# Patient Record
Sex: Female | Born: 1960 | Race: Black or African American | Hispanic: No | Marital: Single | Smoking: Current every day smoker
Health system: Southern US, Community
[De-identification: ages and names within clinical notes are randomized; demographics above are authoritative.]

## PROBLEM LIST (undated history)

## (undated) DIAGNOSIS — F319 Bipolar disorder, unspecified: Secondary | ICD-10-CM

## (undated) DIAGNOSIS — J189 Pneumonia, unspecified organism: Secondary | ICD-10-CM

## (undated) DIAGNOSIS — F419 Anxiety disorder, unspecified: Secondary | ICD-10-CM

## (undated) DIAGNOSIS — I639 Cerebral infarction, unspecified: Secondary | ICD-10-CM

## (undated) DIAGNOSIS — E119 Type 2 diabetes mellitus without complications: Secondary | ICD-10-CM

## (undated) DIAGNOSIS — F329 Major depressive disorder, single episode, unspecified: Secondary | ICD-10-CM

## (undated) DIAGNOSIS — I10 Essential (primary) hypertension: Secondary | ICD-10-CM

## (undated) DIAGNOSIS — F32A Depression, unspecified: Secondary | ICD-10-CM

---

## 2001-12-23 ENCOUNTER — Encounter: Admission: RE | Admit: 2001-12-23 | Discharge: 2002-03-23 | Payer: Self-pay | Admitting: Family Medicine

## 2002-12-04 ENCOUNTER — Emergency Department (HOSPITAL_COMMUNITY): Admission: EM | Admit: 2002-12-04 | Discharge: 2002-12-04 | Payer: Self-pay | Admitting: Emergency Medicine

## 2004-02-08 ENCOUNTER — Emergency Department (HOSPITAL_COMMUNITY): Admission: EM | Admit: 2004-02-08 | Discharge: 2004-02-08 | Payer: Self-pay | Admitting: Emergency Medicine

## 2006-09-16 ENCOUNTER — Emergency Department (HOSPITAL_COMMUNITY): Admission: EM | Admit: 2006-09-16 | Discharge: 2006-09-16 | Payer: Self-pay | Admitting: Emergency Medicine

## 2007-01-14 ENCOUNTER — Emergency Department (HOSPITAL_COMMUNITY): Admission: EM | Admit: 2007-01-14 | Discharge: 2007-01-14 | Payer: Self-pay | Admitting: Emergency Medicine

## 2008-05-27 ENCOUNTER — Emergency Department (HOSPITAL_COMMUNITY): Admission: EM | Admit: 2008-05-27 | Discharge: 2008-05-27 | Payer: Self-pay | Admitting: Emergency Medicine

## 2011-05-23 ENCOUNTER — Inpatient Hospital Stay (INDEPENDENT_AMBULATORY_CARE_PROVIDER_SITE_OTHER)
Admission: RE | Admit: 2011-05-23 | Discharge: 2011-05-23 | Disposition: A | Discharge status: Home / Self Care | Payer: Self-pay | Source: Ambulatory Visit | Attending: Family Medicine | Admitting: Family Medicine

## 2011-05-23 DIAGNOSIS — L259 Unspecified contact dermatitis, unspecified cause: Secondary | ICD-10-CM

## 2012-05-13 ENCOUNTER — Emergency Department (HOSPITAL_COMMUNITY)
Admission: EM | Admit: 2012-05-13 | Discharge: 2012-05-13 | Disposition: A | Discharge status: Home / Self Care | Payer: Medicare Other | Attending: Emergency Medicine | Admitting: Emergency Medicine

## 2012-05-13 ENCOUNTER — Emergency Department (HOSPITAL_COMMUNITY): Payer: Medicare Other

## 2012-05-13 ENCOUNTER — Encounter (HOSPITAL_COMMUNITY): Payer: Self-pay | Admitting: *Deleted

## 2012-05-13 DIAGNOSIS — S43429A Sprain of unspecified rotator cuff capsule, initial encounter: Secondary | ICD-10-CM

## 2012-05-13 DIAGNOSIS — F17200 Nicotine dependence, unspecified, uncomplicated: Secondary | ICD-10-CM | POA: Insufficient documentation

## 2012-05-13 DIAGNOSIS — M25519 Pain in unspecified shoulder: Secondary | ICD-10-CM

## 2012-05-13 MED ORDER — IBUPROFEN 200 MG PO TABS
600.0000 mg | ORAL_TABLET | Freq: Once | ORAL | Status: AC
  Administered 2012-05-13: 600 mg via ORAL
  Filled 2012-05-13: qty 1

## 2012-05-13 MED ORDER — HYDROCODONE-ACETAMINOPHEN 5-325 MG PO TABS
2.0000 | ORAL_TABLET | Freq: Once | ORAL | Status: AC
  Administered 2012-05-13: 2 via ORAL
  Filled 2012-05-13: qty 2

## 2012-05-13 MED ORDER — IBUPROFEN 600 MG PO TABS: 600.0000 mg | ORAL_TABLET | Freq: Four times a day (QID) | ORAL | Status: DC | PRN

## 2012-05-13 MED ORDER — HYDROCODONE-ACETAMINOPHEN 5-325 MG PO TABS: 1.0000 | ORAL_TABLET | Freq: Four times a day (QID) | ORAL | Status: DC | PRN

## 2012-05-13 NOTE — ED Provider Notes (Addendum)
History  This chart was scribed for Derwood Kaplan, MD by Ardeen Jourdain. This patient was seen in room TR08C/TR08C and the patient's care was started at 1800.  CSN: [id number]  Arrival date & time 05/13/12  1747   None     Chief Complaint  Patient presents with  . Arm Pain     The history is provided by the patient. No language interpreter was used.    Gina Richards is a 51 y.o. female who presents to the Emergency Department complaining of 3 to 4 months of constant, gradual onset, acutely worsening right shoulder pain. She states she cannot raise her arm above her shoulder due to the pain. She denies any recent injuries or falls. She denies taking any medications for the shoulder pain prior to coming to the ED. She denies seeing a MD prior to today. She denies fever, chills, nausea and emesis as associated symptoms. She does not have a h/o chronic medical conditions. She has a h/o of nerve related pain in her hands for which she takes Gabapentin. She is a current everyday smoker and admits to drinking alcohol.  Pt is right-handed.  No PCP.   History reviewed. No pertinent past medical history.  History reviewed. No pertinent past surgical history.  No family history on file.  History  Substance Use Topics  . Smoking status: Current Every Day Smoker  . Smokeless tobacco: Not on file  . Alcohol Use: Yes     Review of Systems  A complete 10 system review of systems was obtained and all systems are negative except as noted in the HPI and PMH.    Allergies  Review of patient's allergies indicates no known allergies.  Home Medications   Current Outpatient Rx  Name Route Sig Dispense Refill  . FLUOXETINE HCL 40 MG PO CAPS Oral Take 40 mg by mouth daily.    Marland Kitchen GABAPENTIN 600 MG PO TABS Oral Take 600 mg by mouth 3 (three) times daily.    Marland Kitchen ZOLPIDEM TARTRATE 5 MG PO TABS Oral Take 5 mg by mouth at bedtime as needed. For insomnia      Triage Vitals: BP 162/97  Pulse 65   Temp 98.5 F (36.9 C) (Oral)  Resp 18  SpO2 97%  Physical Exam  Nursing note and vitals reviewed. Constitutional: She is oriented to person, place, and time. She appears well-developed and well-nourished. No distress.  HENT:  Head: Normocephalic and atraumatic.  Eyes: EOM are normal.  Neck: Neck supple. No tracheal deviation present.  Cardiovascular: Normal rate and regular rhythm.        2+ radial pulse  Pulmonary/Chest: Effort normal and breath sounds normal. No respiratory distress.  Abdominal: Soft. Bowel sounds are normal.  Musculoskeletal: Normal range of motion. She exhibits tenderness.       Clavicle intact, no step off, GH joint tenderness over right shoulder, forward flexion up the level of the shoulder plane, abduction to the level of the shoulder plane, mild tenderness with external and internal rotation.   Neurological: She is alert and oriented to person, place, and time.  Skin: Skin is warm and dry.  Psychiatric: She has a normal mood and affect. Her behavior is normal.    ED Course  Procedures (including critical care time)  DIAGNOSTIC STUDIES:  Oxygen Saturation is 97% on room air, adequate by my interpretation.    COORDINATION OF CARE: 60- Discussed treatment plan with pt at bedside and pt agreed to plan.    Labs  Reviewed - No data to display Dg Shoulder Right  05/13/2012  *RADIOLOGY REPORT*  Clinical Data: Right shoulder pain  RIGHT SHOULDER - 2+ VIEW  Comparison: Chest radiograph 09/16/2006  Findings: Glenohumeral joint is intact.  No evidence of scapular fractures or fracture.  There is osteophytosis of the humeral head as well as the acromioclavicular joint.  IMPRESSION:  1.  No evidence of fracture or dislocation. 2.  Chronic degenerative joint disease.   Original Report Authenticated By: Genevive Bi, M.D.      No diagnosis found.    MDM  Medical screening examination/treatment/procedure(s) were performed by me as the supervising physician.  Scribe service was utilized for documentation only.  DDX: Rotator cuff injury DJD of the shoulder  Pt comes in with cc of shoulder pain. Her exam reveals decreased ROm of the right shoulder, primarily due to pain. The exam is also consistent with likely rotator cuff injury. Will get a shoulder X-ray. Will give pain meds, and will request pcp and ortho f/u - since she might need further imaging or Physical therapy.    Derwood Kaplan, MD 05/13/12 2137  Derwood Kaplan, MD 05/13/12 2140

## 2012-05-13 NOTE — ED Notes (Signed)
The pt is c/o pain  In her entire rt arm from her shoulder down to her rt fingers for 3-4 months.  No known injury

## 2012-06-14 ENCOUNTER — Emergency Department (HOSPITAL_COMMUNITY)
Admission: EM | Admit: 2012-06-14 | Discharge: 2012-06-14 | Disposition: A | Discharge status: Home / Self Care | Payer: Medicare Other | Attending: Emergency Medicine | Admitting: Emergency Medicine

## 2012-06-14 ENCOUNTER — Encounter (HOSPITAL_COMMUNITY): Payer: Self-pay | Admitting: Emergency Medicine

## 2012-06-14 DIAGNOSIS — F3289 Other specified depressive episodes: Secondary | ICD-10-CM | POA: Insufficient documentation

## 2012-06-14 DIAGNOSIS — I10 Essential (primary) hypertension: Secondary | ICD-10-CM | POA: Insufficient documentation

## 2012-06-14 DIAGNOSIS — F411 Generalized anxiety disorder: Secondary | ICD-10-CM | POA: Insufficient documentation

## 2012-06-14 DIAGNOSIS — F329 Major depressive disorder, single episode, unspecified: Secondary | ICD-10-CM | POA: Insufficient documentation

## 2012-06-14 DIAGNOSIS — K089 Disorder of teeth and supporting structures, unspecified: Secondary | ICD-10-CM | POA: Insufficient documentation

## 2012-06-14 DIAGNOSIS — K0889 Other specified disorders of teeth and supporting structures: Secondary | ICD-10-CM

## 2012-06-14 DIAGNOSIS — Z79899 Other long term (current) drug therapy: Secondary | ICD-10-CM | POA: Insufficient documentation

## 2012-06-14 DIAGNOSIS — F17200 Nicotine dependence, unspecified, uncomplicated: Secondary | ICD-10-CM | POA: Insufficient documentation

## 2012-06-14 HISTORY — DX: Depression, unspecified: F32.A

## 2012-06-14 HISTORY — DX: Anxiety disorder, unspecified: F41.9

## 2012-06-14 HISTORY — DX: Essential (primary) hypertension: I10

## 2012-06-14 HISTORY — DX: Major depressive disorder, single episode, unspecified: F32.9

## 2012-06-14 MED ORDER — PENICILLIN V POTASSIUM 500 MG PO TABS: 500.0000 mg | ORAL_TABLET | Freq: Four times a day (QID) | ORAL | Status: AC

## 2012-06-14 MED ORDER — OXYCODONE-ACETAMINOPHEN 5-325 MG PO TABS
2.0000 | ORAL_TABLET | Freq: Once | ORAL | Status: AC
  Administered 2012-06-14: 2 via ORAL
  Filled 2012-06-14: qty 2

## 2012-06-14 MED ORDER — OXYCODONE-ACETAMINOPHEN 5-325 MG PO TABS: 1.0000 | ORAL_TABLET | Freq: Four times a day (QID) | ORAL | Status: DC | PRN

## 2012-06-14 NOTE — ED Provider Notes (Signed)
History  Scribed for [employer]. Bernette Mayers, MD, the patient was seen in room TR06C/TR06C. This chart was scribed by Candelaria Stagers. The patient's care started at 6:11 PM   CSN: [id number]  Arrival date & time 06/14/12  1721   First MD Initiated Contact with Patient 06/14/12 1801      Chief Complaint  Patient presents with  . Facial Pain     The history is provided by the patient. No language interpreter was used.   Gina Richards is a 51 y.o. female who presents to the Emergency Department complaining of right sided facial pain that started yesterday with tingling.  She is also experiencing a headache.  Nothing seems to make the sx better or worse.   Past Medical History  Diagnosis Date  . Hypertension   . Anxiety   . Depression     History reviewed. No pertinent past surgical history.  History reviewed. No pertinent family history.  History  Substance Use Topics  . Smoking status: Current Every Day Smoker -- 0.2 packs/day    Types: Cigarettes  . Smokeless tobacco: Not on file  . Alcohol Use: Yes     occasion    OB History    Grav Para Term Preterm Abortions TAB SAB Ect Mult Living                  Review of Systems  HENT: Positive for facial swelling (right sided).        Right sided facial pain  All other systems reviewed and are negative.    Allergies  Review of patient's allergies indicates no known allergies.  Home Medications   Current Outpatient Rx  Name Route Sig Dispense Refill  . FLUOXETINE HCL 40 MG PO CAPS Oral Take 40 mg by mouth daily.    Marland Kitchen GABAPENTIN 600 MG PO TABS Oral Take 600 mg by mouth 3 (three) times daily.    Marland Kitchen HYDROCODONE-ACETAMINOPHEN 5-325 MG PO TABS Oral Take 1 tablet by mouth every 6 (six) hours as needed for pain. 10 tablet 0  . IBUPROFEN 600 MG PO TABS Oral Take 1 tablet (600 mg total) by mouth every 6 (six) hours as needed for pain. 30 tablet 0  . ZOLPIDEM TARTRATE 5 MG PO TABS Oral Take 5 mg by mouth at bedtime as needed. For  insomnia      BP 165/104  Pulse 72  Temp 98.1 F (36.7 C) (Oral)  Resp 18  SpO2 94%  Physical Exam  Nursing note and vitals reviewed. Constitutional: She is oriented to person, place, and time. She appears well-developed and well-nourished.  HENT:  Head: Normocephalic and atraumatic.       Tender over the gum of the right upper teeth.  No fluctuants.   Eyes: EOM are normal. Pupils are equal, round, and reactive to light.  Neck: Normal range of motion. Neck supple.  Cardiovascular: Normal rate, normal heart sounds and intact distal pulses.   Pulmonary/Chest: Effort normal and breath sounds normal.  Abdominal: Bowel sounds are normal. She exhibits no distension. There is no tenderness.  Musculoskeletal: Normal range of motion. She exhibits no edema and no tenderness.  Neurological: She is alert and oriented to person, place, and time. She has normal strength. No cranial nerve deficit or sensory deficit.  Skin: Skin is warm and dry. No rash noted.  Psychiatric: She has a normal mood and affect.    ED Course  Procedures   DIAGNOSTIC STUDIES:     COORDINATION OF  CARE:    Labs Reviewed - No data to display No results found.   No diagnosis found.    MDM  Facial pain, likely dental in origin, Given PCN/pain meds and Dental referral.    I personally performed the services described in the documentation, which were scribed in my presence. The recorded information has been reviewed and considered.        Tuvia Woodrick B. Bernette Mayers, MD 06/14/12 2130

## 2012-06-14 NOTE — ED Notes (Signed)
Pt reports having swelling and pain to R upper mouth/sinus; reports causing headache

## 2013-11-10 ENCOUNTER — Encounter (HOSPITAL_COMMUNITY): Payer: Self-pay | Admitting: Emergency Medicine

## 2013-11-10 ENCOUNTER — Emergency Department (HOSPITAL_COMMUNITY)
Admission: EM | Admit: 2013-11-10 | Discharge: 2013-11-10 | Disposition: A | Discharge status: Home / Self Care | Payer: Medicare Other | Attending: Emergency Medicine | Admitting: Emergency Medicine

## 2013-11-10 DIAGNOSIS — I10 Essential (primary) hypertension: Secondary | ICD-10-CM | POA: Insufficient documentation

## 2013-11-10 DIAGNOSIS — M79605 Pain in left leg: Secondary | ICD-10-CM

## 2013-11-10 DIAGNOSIS — F3289 Other specified depressive episodes: Secondary | ICD-10-CM | POA: Insufficient documentation

## 2013-11-10 DIAGNOSIS — F329 Major depressive disorder, single episode, unspecified: Secondary | ICD-10-CM | POA: Insufficient documentation

## 2013-11-10 DIAGNOSIS — F411 Generalized anxiety disorder: Secondary | ICD-10-CM | POA: Insufficient documentation

## 2013-11-10 DIAGNOSIS — M79609 Pain in unspecified limb: Secondary | ICD-10-CM | POA: Insufficient documentation

## 2013-11-10 DIAGNOSIS — F17200 Nicotine dependence, unspecified, uncomplicated: Secondary | ICD-10-CM | POA: Insufficient documentation

## 2013-11-10 DIAGNOSIS — Z79899 Other long term (current) drug therapy: Secondary | ICD-10-CM | POA: Insufficient documentation

## 2013-11-10 MED ORDER — HYDROCODONE-ACETAMINOPHEN 5-325 MG PO TABS: 1.0000 | ORAL_TABLET | Freq: Four times a day (QID) | ORAL | Status: DC | PRN

## 2013-11-10 MED ORDER — NAPROXEN 500 MG PO TABS: 500.0000 mg | ORAL_TABLET | Freq: Two times a day (BID) | ORAL | Status: DC

## 2013-11-10 NOTE — ED Notes (Signed)
Left l;eg pain  X 2 weeks hurts from going to foot denies injury

## 2013-11-10 NOTE — ED Provider Notes (Signed)
CSN: [id number]     Arrival date & time 11/10/13  1427 History  This chart was scribed for non-physician practitioner Felicie Mornavid Shayra Anton, NP, working with Glynn OctaveStephen Rancour, MD, by Valera CastleSteven Perry ED Scribe. This patient was seen in room TR05C/TR05C and the patient's care was started at 4:21 PM  Patient is a 53 y.o. female presenting with leg pain. The history is provided by the patient. No language interpreter was used.  Leg Pain Location:  Leg Time since incident:  2 weeks Injury: no   Leg location:  L leg Pain details:    Quality:  Throbbing   Onset quality:  Gradual   Duration:  2 weeks   Pain timing: waxing and waning.   Progression:  Worsening Chronicity:  New Prior injury to area:  No Worsened by:  Bearing weight Ineffective treatments:  Acetaminophen and NSAIDs Associated symptoms: numbness (left foot)   Associated symptoms: no fever and no swelling    HPI Comments: Gina Richards is a 53 y.o. female who presents to the Emergency Department with a chief complaint of gradually worsening, waxing and waning, throbbing, left LE pain, from her upper thigh down to her foot, with associated numbness in her foot, onset 2 weeks ago. She denies recent injury to her leg. She reports having a hard time ambulating, standing due to the pain. She has taken Tylenol, Ibuprofen for her pain, without relief. She reports taking psychiatric medication, onset 3-4 months ago. She denies fever, chills, urinary symptoms, and any other associated symptoms.  Past Medical History  Diagnosis Date  . Hypertension   . Anxiety   . Depression    History reviewed. No pertinent past surgical history. No family history on file. History  Substance Use Topics  . Smoking status: Current Every Day Smoker -- 0.25 packs/day    Types: Cigarettes  . Smokeless tobacco: Not on file  . Alcohol Use: Yes     Comment: occasion   OB History   Grav Para Term Preterm Abortions TAB SAB Ect Mult Living                 Review of  Systems  Constitutional: Negative for fever and chills.  Genitourinary: Negative for dysuria and difficulty urinating.  Musculoskeletal: Positive for myalgias (left LE, from thigh to foot). Negative for joint swelling.  All other systems reviewed and are negative.   Allergies  Review of patient's allergies indicates no known allergies.  Home Medications   Current Outpatient Rx  Name  Route  Sig  Dispense  Refill  . FLUoxetine (PROZAC) 40 MG capsule   Oral   Take 40 mg by mouth daily.         Marland Kitchen. gabapentin (NEURONTIN) 600 MG tablet   Oral   Take 600 mg by mouth 3 (three) times daily.          Triage Vitals: BP 163/98  Pulse 62  Temp(Src) 98.1 F (36.7 C)  Resp 16  SpO2 99%  Physical Exam  Nursing note and vitals reviewed. Constitutional: She is oriented to person, place, and time. She appears well-developed and well-nourished. No distress.  HENT:  Head: Normocephalic and atraumatic.  Eyes: EOM are normal.  Neck: Neck supple. No tracheal deviation present.  Cardiovascular: Normal rate and intact distal pulses.   Pulmonary/Chest: Effort normal. No respiratory distress.  Musculoskeletal: Normal range of motion.  No increased pain with active ROM. DP intact.  Neurological: She is alert and oriented to person, place, and time.  Good  strength to LE. Normal sensation.   Skin: Skin is warm and dry.  Psychiatric: She has a normal mood and affect. Her behavior is normal.   ED Course  Procedures (including critical care time)  DIAGNOSTIC STUDIES: Oxygen Saturation is 99% on room air, normal by my interpretation.    COORDINATION OF CARE: 4:27 PM-Discussed treatment plan with pt which includes muscle relaxant and pain medication, and pt agreed to plan.   Labs Review Labs Reviewed - No data to display Imaging Review No results found.   EKG Interpretation None      Medications - No data to display MDM   Final diagnoses:  None    Lower extremity pain.  No  indication of DVT.  I personally performed the services described in this documentation, which was scribed in my presence. The recorded information has been reviewed and is accurate.    Jimmye Norman, NP 11/11/13 [phone]

## 2013-11-10 NOTE — Discharge Instructions (Signed)
Pain of Unknown Etiology (Pain Without a Known Cause) You have come to your caregiver because of pain. Pain can occur in any part of the body. Often there is not a definite cause. Your caregiver may treat you without knowing the cause of the pain. Your caregiver determines a treatment based on your answers.  Often testing is not done unless there is no response to medications. Regardless of where your pain is located today, you can be given medications to make you comfortable. If no physical cause of pain can be found, most cases of pain will gradually leave as suddenly as they came.  If you have a painful condition and no reason can be found for the pain, it is important that you follow up with your caregiver. If the pain becomes worse or does not go away, it may be necessary to repeat tests and look further for a possible cause.  Only take over-the-counter or prescription medicines for pain, discomfort, or fever as directed by your caregiver.  You may continue all activities unless the activities cause more pain. When the pain lessens, it is important to gradually resume normal activities. Resume activities by beginning slowly and gradually increasing the intensity and duration of the activities or exercise. During periods of severe pain, bed rest may be helpful. Lie or sit in any position that is comfortable.  Ice used for acute (sudden) conditions may be effective. Use a large plastic bag filled with ice and wrapped in a towel. This may provide pain relief.  See your caregiver for continued problems. Your caregiver can help or refer you for exercises or physical therapy if necessary. If you were given medications for your condition, do not drive, operate machinery or power tools, or sign legal documents for 24 hours. Do not drink alcohol, take sleeping pills, or take other medications that may interfere with treatment. See your caregiver immediately if you have pain that is becoming worse and not  relieved by medications. Document Released: 05/02/2001 Document Revised: 05/28/2013 Document Reviewed: 08/07/2005 [facility] Patient Information 2014 [employer], [place].

## 2013-11-10 NOTE — ED Notes (Addendum)
Patient states has had leg pain x 2 months.   Patient states it has progressively gotten worse.   Patient denies injury.   Patient states "I take pills at home for the pain, but it doesn't help.  I need you to give me some more pain pills or something".   Patient states "it feels like I have prickly briars on me or something".

## 2013-11-11 NOTE — ED Provider Notes (Signed)
Medical screening examination/treatment/procedure(s) were performed by non-physician practitioner and as supervising physician I was immediately available for consultation/collaboration.   EKG Interpretation None       Glynn OctaveStephen Srinika Delone, MD 11/11/13 1021

## 2013-11-30 ENCOUNTER — Emergency Department (HOSPITAL_COMMUNITY)
Admission: EM | Admit: 2013-11-30 | Discharge: 2013-11-30 | Disposition: A | Discharge status: Home / Self Care | Payer: Medicaid Other | Source: Home / Self Care

## 2013-12-07 ENCOUNTER — Encounter (HOSPITAL_COMMUNITY): Payer: Self-pay | Admitting: Emergency Medicine

## 2013-12-07 ENCOUNTER — Emergency Department (HOSPITAL_COMMUNITY): Payer: Medicare Other

## 2013-12-07 ENCOUNTER — Emergency Department (HOSPITAL_COMMUNITY)
Admission: EM | Admit: 2013-12-07 | Discharge: 2013-12-07 | Disposition: A | Discharge status: Home / Self Care | Payer: Medicare Other | Attending: Emergency Medicine | Admitting: Emergency Medicine

## 2013-12-07 DIAGNOSIS — F3289 Other specified depressive episodes: Secondary | ICD-10-CM | POA: Insufficient documentation

## 2013-12-07 DIAGNOSIS — Z79899 Other long term (current) drug therapy: Secondary | ICD-10-CM | POA: Insufficient documentation

## 2013-12-07 DIAGNOSIS — G479 Sleep disorder, unspecified: Secondary | ICD-10-CM | POA: Insufficient documentation

## 2013-12-07 DIAGNOSIS — F17200 Nicotine dependence, unspecified, uncomplicated: Secondary | ICD-10-CM | POA: Insufficient documentation

## 2013-12-07 DIAGNOSIS — R209 Unspecified disturbances of skin sensation: Secondary | ICD-10-CM | POA: Insufficient documentation

## 2013-12-07 DIAGNOSIS — F329 Major depressive disorder, single episode, unspecified: Secondary | ICD-10-CM | POA: Insufficient documentation

## 2013-12-07 DIAGNOSIS — M25559 Pain in unspecified hip: Secondary | ICD-10-CM | POA: Insufficient documentation

## 2013-12-07 DIAGNOSIS — F411 Generalized anxiety disorder: Secondary | ICD-10-CM | POA: Insufficient documentation

## 2013-12-07 DIAGNOSIS — I10 Essential (primary) hypertension: Secondary | ICD-10-CM | POA: Insufficient documentation

## 2013-12-07 DIAGNOSIS — M79605 Pain in left leg: Secondary | ICD-10-CM

## 2013-12-07 MED ORDER — OXYCODONE-ACETAMINOPHEN 5-325 MG PO TABS
2.0000 | ORAL_TABLET | Freq: Once | ORAL | Status: AC
  Administered 2013-12-07: 2 via ORAL
  Filled 2013-12-07: qty 2

## 2013-12-07 MED ORDER — OXYCODONE-ACETAMINOPHEN 5-325 MG PO TABS: 2.0000 | ORAL_TABLET | ORAL | Status: DC | PRN

## 2013-12-07 NOTE — ED Provider Notes (Signed)
CSN: [id number]     Arrival date & time 12/07/13  [id number] History  This chart was scribed for non-physician practitioner Emilia BeckKaitlyn Jashon Richards, working with Dagmar HaitWilliam Blair Walden, MD by Carl Bestelina Holson, ED Scribe. This patient was seen in room TR10C/TR10C and the patient's care was started at 11:17 AM.    Chief Complaint  Patient presents with  . Leg Pain     Patient is a 53 y.o. female presenting with leg pain. The history is provided by the patient. No language interpreter was used.  Leg Pain Associated symptoms: no back pain    HPI Comments: Gina Richards is a 53 y.o. female who presents to the Emergency Department complaining of constant, aching, throbbing left leg and left hip pain that started two weeks ago.  The patient states that she is unable to stand on her left leg and the pain inhibits her from sleeping at night.  She states that she has applied ice and heat to her left leg.  The patient denies having problems with this leg in the past.  She denies abdominal pain and back pain as associated symptoms.  She lists numbness in her left foot as an associated symptom.  She states that she went to her PCP for her symptoms after she was seen in the ED and states that she had tissue damage in her left leg.  The patient states that she a follow-up appointment with her PCP next month.     Past Medical History  Diagnosis Date  . Hypertension   . Anxiety   . Depression    History reviewed. No pertinent past surgical history. No family history on file. History  Substance Use Topics  . Smoking status: Current Every Day Smoker -- 0.25 packs/day    Types: Cigarettes  . Smokeless tobacco: Not on file  . Alcohol Use: Yes     Comment: occasion   OB History   Grav Para Term Preterm Abortions TAB SAB Ect Mult Living                 Review of Systems  Gastrointestinal: Negative for abdominal pain.  Musculoskeletal: Positive for arthralgias (left leg, left hip). Negative for back pain.   Neurological: Positive for numbness (left foot).  Psychiatric/Behavioral: Positive for sleep disturbance.  All other systems reviewed and are negative.     Allergies  Review of patient's allergies indicates no known allergies.  Home Medications   Prior to Admission medications   Medication Sig Start Date End Date Taking? Authorizing Provider  FLUoxetine (PROZAC) 40 MG capsule Take 40 mg by mouth daily.    Historical Provider, MD  gabapentin (NEURONTIN) 600 MG tablet Take 600 mg by mouth 3 (three) times daily.    Historical Provider, MD  HYDROcodone-acetaminophen (NORCO/VICODIN) 5-325 MG per tablet Take 1 tablet by mouth every 6 (six) hours as needed for severe pain. 11/10/13   Jimmye Normanavid John Smith, NP  naproxen (NAPROSYN) 500 MG tablet Take 1 tablet (500 mg total) by mouth 2 (two) times daily. 11/10/13   Jimmye Normanavid John Smith, NP   Triage Vitals: BP 156/114  Temp(Src) 98.7 F (37.1 C) (Oral)  Resp 18  SpO2 96%  Physical Exam  Nursing note and vitals reviewed. Constitutional: She is oriented to person, place, and time. She appears well-developed and well-nourished.  HENT:  Head: Normocephalic and atraumatic.  Eyes: EOM are normal.  Neck: Normal range of motion.  Cardiovascular: Normal rate.   Pulmonary/Chest: Effort normal.  Musculoskeletal: Normal range of motion.  Neurological: She is alert and oriented to person, place, and time.  Skin: Skin is warm and dry.  Psychiatric: She has a normal mood and affect. Her behavior is normal.    ED Course  Procedures (including critical care time)  DIAGNOSTIC STUDIES: Oxygen Saturation is 96% on room air, adequate by my interpretation.    COORDINATION OF CARE: 11:22 AM- Discussed the x-ray results with the patient that did not reveal any abnormalities.  Discussed discharging the patient with pain medication and a muscle relaxer.  Advised the patient to follow-up with her PCP.  The patient agreed to the treatment plan.   Labs Review Labs  Reviewed - No data to display  Imaging Review Dg Tibia/fibula Left  12/07/2013   CLINICAL DATA:  Pain for months.  No known injury.  EXAM: LEFT TIBIA AND FIBULA - 2 VIEW  COMPARISON:  None.  FINDINGS: There is no evidence of fracture or other focal bone lesions. Soft tissues are unremarkable.  IMPRESSION: Negative.   Electronically Signed   By: Davonna BellingJohn  Curnes M.D.   On: 12/07/2013 11:13     EKG Interpretation None      MDM   Final diagnoses:  Left leg pain    Xray unremarkable for acute changes. Patient will have percocet for pain and recommended follow up with her PCP. No neurovascular compromise. Vitals stable and patient afebrile.   I personally performed the services described in this documentation, which was scribed in my presence. The recorded information has been reviewed and is accurate.    Emilia BeckKaitlyn [name], [place] JerseyPA-C 12/07/13 [phone]

## 2013-12-07 NOTE — Discharge Instructions (Signed)
Take Percocet as needed for pain. Follow up with your doctor for further evaluation and management of your pain.

## 2013-12-07 NOTE — ED Notes (Signed)
Pt presents to department for evaluation of L leg pain. Ongoing x2 weeks. States she might of fell on leg. 8/10 pain upon arrival. Able to move leg without difficulty. Pt is alert and oriented x4.

## 2013-12-12 NOTE — ED Provider Notes (Signed)
Medical screening examination/treatment/procedure(s) were performed by non-physician practitioner and as supervising physician I was immediately available for consultation/collaboration.   EKG Interpretation None        Dagmar HaitWilliam Deeya Richeson, MD 12/12/13 [phone]

## 2014-03-30 ENCOUNTER — Other Ambulatory Visit: Payer: Self-pay | Admitting: Internal Medicine

## 2014-03-30 DIAGNOSIS — Z1231 Encounter for screening mammogram for malignant neoplasm of breast: Secondary | ICD-10-CM

## 2014-04-03 ENCOUNTER — Ambulatory Visit: Payer: Medicare Other

## 2014-04-08 ENCOUNTER — Ambulatory Visit: Payer: Medicare Other

## 2014-04-13 ENCOUNTER — Ambulatory Visit: Payer: Medicare Other

## 2014-04-24 ENCOUNTER — Ambulatory Visit: Payer: Medicare Other

## 2014-05-01 ENCOUNTER — Ambulatory Visit: Payer: Medicare Other

## 2014-07-10 ENCOUNTER — Ambulatory Visit: Payer: Medicare Other

## 2014-07-24 ENCOUNTER — Ambulatory Visit: Payer: Medicare Other

## 2014-08-07 ENCOUNTER — Ambulatory Visit: Payer: Medicare Other

## 2014-11-10 ENCOUNTER — Emergency Department (HOSPITAL_COMMUNITY): Payer: Medicare Other

## 2014-11-10 ENCOUNTER — Encounter (HOSPITAL_COMMUNITY): Payer: Self-pay | Admitting: *Deleted

## 2014-11-10 ENCOUNTER — Emergency Department (HOSPITAL_COMMUNITY)
Admission: EM | Admit: 2014-11-10 | Discharge: 2014-11-10 | Disposition: A | Discharge status: Home / Self Care | Payer: Medicare Other | Attending: Emergency Medicine | Admitting: Emergency Medicine

## 2014-11-10 DIAGNOSIS — N12 Tubulo-interstitial nephritis, not specified as acute or chronic: Secondary | ICD-10-CM | POA: Diagnosis not present

## 2014-11-10 DIAGNOSIS — F419 Anxiety disorder, unspecified: Secondary | ICD-10-CM | POA: Diagnosis not present

## 2014-11-10 DIAGNOSIS — R109 Unspecified abdominal pain: Secondary | ICD-10-CM | POA: Diagnosis present

## 2014-11-10 DIAGNOSIS — Z720 Tobacco use: Secondary | ICD-10-CM | POA: Insufficient documentation

## 2014-11-10 DIAGNOSIS — Z791 Long term (current) use of non-steroidal anti-inflammatories (NSAID): Secondary | ICD-10-CM | POA: Diagnosis not present

## 2014-11-10 DIAGNOSIS — F329 Major depressive disorder, single episode, unspecified: Secondary | ICD-10-CM | POA: Insufficient documentation

## 2014-11-10 DIAGNOSIS — Z79899 Other long term (current) drug therapy: Secondary | ICD-10-CM | POA: Insufficient documentation

## 2014-11-10 DIAGNOSIS — I10 Essential (primary) hypertension: Secondary | ICD-10-CM | POA: Insufficient documentation

## 2014-11-10 LAB — URINALYSIS, ROUTINE W REFLEX MICROSCOPIC
Bilirubin Urine: NEGATIVE
Glucose, UA: NEGATIVE mg/dL
Ketones, ur: 15 mg/dL — AB
Nitrite: POSITIVE — AB
Protein, ur: 100 mg/dL — AB
Specific Gravity, Urine: 1.012 (ref 1.005–1.030)
Urobilinogen, UA: >8 mg/dL — ABNORMAL HIGH (ref 0.0–1.0)
pH: 7 (ref 5.0–8.0)

## 2014-11-10 LAB — COMPREHENSIVE METABOLIC PANEL
ALT: 187 U/L — ABNORMAL HIGH (ref 0–35)
AST: 258 U/L — ABNORMAL HIGH (ref 0–37)
Albumin: 3.1 g/dL — ABNORMAL LOW (ref 3.5–5.2)
Alkaline Phosphatase: 229 U/L — ABNORMAL HIGH (ref 39–117)
Anion gap: 12 (ref 5–15)
BUN: 7 mg/dL (ref 6–23)
CO2: 24 mmol/L (ref 19–32)
Calcium: 8.7 mg/dL (ref 8.4–10.5)
Chloride: 98 mmol/L (ref 96–112)
Creatinine, Ser: 0.77 mg/dL (ref 0.50–1.10)
GFR calc Af Amer: >90 mL/min (ref >90)
GFR calc non Af Amer: >90 mL/min (ref >90)
Glucose, Bld: 120 mg/dL — ABNORMAL HIGH (ref 70–99)
Potassium: 4.5 mmol/L (ref 3.5–5.1)
Sodium: 134 mmol/L — ABNORMAL LOW (ref 135–145)
Total Bilirubin: 2.1 mg/dL — ABNORMAL HIGH (ref 0.3–1.2)
Total Protein: 7.2 g/dL (ref 6.0–8.3)

## 2014-11-10 LAB — CBC WITH DIFFERENTIAL/PLATELET
Basophils Absolute: 0 10*3/uL (ref 0.0–0.1)
Basophils Relative: 0 % (ref 0–1)
Eosinophils Absolute: 0 10*3/uL (ref 0.0–0.7)
Eosinophils Relative: 0 % (ref 0–5)
HCT: 40.9 % (ref 36.0–46.0)
Hemoglobin: 14.4 g/dL (ref 12.0–15.0)
Lymphocytes Relative: 13 % (ref 12–46)
Lymphs Abs: 1.4 10*3/uL (ref 0.7–4.0)
MCH: 33 pg (ref 26.0–34.0)
MCHC: 35.2 g/dL (ref 30.0–36.0)
MCV: 93.6 fL (ref 78.0–100.0)
Monocytes Absolute: 1.6 10*3/uL — ABNORMAL HIGH (ref 0.1–1.0)
Monocytes Relative: 15 % — ABNORMAL HIGH (ref 3–12)
Neutro Abs: 7.7 10*3/uL (ref 1.7–7.7)
Neutrophils Relative %: 72 % (ref 43–77)
Platelets: 196 10*3/uL (ref 150–400)
RBC: 4.37 MIL/uL (ref 3.87–5.11)
RDW: 13.3 % (ref 11.5–15.5)
WBC: 10.7 10*3/uL — ABNORMAL HIGH (ref 4.0–10.5)

## 2014-11-10 LAB — LIPASE, BLOOD: LIPASE: 17 U/L (ref 11–59)

## 2014-11-10 LAB — URINE MICROSCOPIC-ADD ON

## 2014-11-10 MED ORDER — IOHEXOL 300 MG/ML  SOLN
80.0000 mL | Freq: Once | INTRAMUSCULAR | Status: AC | PRN
  Administered 2014-11-10: 80 mL via INTRAVENOUS

## 2014-11-10 MED ORDER — CIPROFLOXACIN IN D5W 400 MG/200ML IV SOLN
400.0000 mg | Freq: Once | INTRAVENOUS | Status: AC
  Administered 2014-11-10: 400 mg via INTRAVENOUS
  Filled 2014-11-10: qty 200

## 2014-11-10 MED ORDER — PROMETHAZINE HCL 25 MG PO TABS: 25.0000 mg | ORAL_TABLET | Freq: Three times a day (TID) | ORAL | Status: DC | PRN

## 2014-11-10 MED ORDER — HYDROCODONE-ACETAMINOPHEN 5-325 MG PO TABS: 1.0000 | ORAL_TABLET | Freq: Four times a day (QID) | ORAL | Status: DC | PRN

## 2014-11-10 MED ORDER — SODIUM CHLORIDE 0.9 % IV SOLN
Freq: Once | INTRAVENOUS | Status: AC
  Administered 2014-11-10: 10:00:00 via INTRAVENOUS

## 2014-11-10 MED ORDER — IOHEXOL 300 MG/ML  SOLN
25.0000 mL | Freq: Once | INTRAMUSCULAR | Status: AC | PRN
  Administered 2014-11-10: 25 mL via ORAL

## 2014-11-10 MED ORDER — SODIUM CHLORIDE 0.9 % IV BOLUS (SEPSIS): 1000.0000 mL | Freq: Once | INTRAVENOUS | Status: DC

## 2014-11-10 MED ORDER — CIPROFLOXACIN HCL 500 MG PO TABS: 500.0000 mg | ORAL_TABLET | Freq: Two times a day (BID) | ORAL | Status: DC

## 2014-11-10 MED ORDER — ONDANSETRON HCL 4 MG/2ML IJ SOLN
4.0000 mg | Freq: Once | INTRAMUSCULAR | Status: AC
  Administered 2014-11-10: 4 mg via INTRAVENOUS
  Filled 2014-11-10: qty 2

## 2014-11-10 MED ORDER — HYDROMORPHONE HCL 1 MG/ML IJ SOLN
0.5000 mg | Freq: Once | INTRAMUSCULAR | Status: AC
  Administered 2014-11-10: 0.5 mg via INTRAVENOUS
  Filled 2014-11-10: qty 1

## 2014-11-10 MED ORDER — IBUPROFEN 400 MG PO TABS
400.0000 mg | ORAL_TABLET | Freq: Once | ORAL | Status: AC
  Administered 2014-11-10: 400 mg via ORAL
  Filled 2014-11-10: qty 1

## 2014-11-10 MED ORDER — ONDANSETRON HCL 4 MG/2ML IJ SOLN: 4.0000 mg | Freq: Once | INTRAMUSCULAR | Status: DC

## 2014-11-10 NOTE — ED Notes (Signed)
Patient unable to void at this time. Will continue to monitor 

## 2014-11-10 NOTE — ED Notes (Signed)
Pt arrives from home via [facility]. Pt states she cooked chicken on Saturday and an hour after eating it she became ill. Pt states she had constant vomiting and diarrhea x 3 days. Pt states she was able to keep down soup and fluids yesterday and hasn't had any vomiting since last night. Pt states she is having abdominal pain and back pain that she believes is rt constant vomiting. Pt is also complaining of fatigue and feeling "worn down".

## 2014-11-10 NOTE — ED Notes (Signed)
Pt returned from [place]

## 2014-11-10 NOTE — ED Provider Notes (Signed)
CSN: [id number]     Arrival date & time 11/10/14  4098 History   First MD Initiated Contact with Patient 11/10/14 0915     Chief Complaint  Patient presents with  . Emesis  . Diarrhea  . Abdominal Pain     (Consider location/radiation/quality/duration/timing/severity/associated sxs/prior Treatment) HPI  The patient is a 54 y/o female who presents to the emergency department with vomiting, diarrhea, and abdominal pain. This began 3 nights ago an hour after eating fried chicken at home. She noticed a light red color in her first vomit, but nothing resembling blood afterward. She states she has been able to tolerate some ginger ale and chicken noodle soup, but the vomiting and diarrhea has continued and yesterday she had 2-3 episodes of each. She has not tried any medications to relieve these symptoms. She also endorses abdominal discomfort in the epigastric area and bilateral upper quadrants. This began after the vomiting. It is crampy and burns up to her throat when she vomits. It is worsened by lying on either side and deep inspiration. She endorses a sore throat, shortness of breath, and dysuria. She has had intermittent fevers and chills without diaphoresis. She denies blurry vision, a productive cough, rhinorrhea, ear pain, chest pain, hematochezia, hematuria, weakness, numbness/tingling. She has a frontal headache today with lightheadedness. She has not fallen. She denies sick contacts.  Past Medical History  Diagnosis Date  . Hypertension   . Anxiety   . Depression    History reviewed. No pertinent past surgical history. History reviewed. No pertinent family history. History  Substance Use Topics  . Smoking status: Current Every Day Smoker -- 0.25 packs/day    Types: Cigarettes  . Smokeless tobacco: Not on file  . Alcohol Use: Yes     Comment: occasion   OB History    No data available     Review of Systems  All other systems negative except as documented in the HPI. All  pertinent positives and negatives as reviewed in the HPI.    Allergies  Review of patient's allergies indicates no known allergies.  Home Medications   Prior to Admission medications   Medication Sig Start Date End Date Taking? Authorizing Provider  diclofenac (VOLTAREN) 75 MG EC tablet Take 1 tablet by mouth 2 (two) times daily. 11/25/13   Historical Provider, MD  FLUoxetine (PROZAC) 40 MG capsule Take 40 mg by mouth daily.    Historical Provider, MD  gabapentin (NEURONTIN) 600 MG tablet Take 600 mg by mouth 3 (three) times daily.    Historical Provider, MD  oxyCODONE-acetaminophen (PERCOCET/ROXICET) 5-325 MG per tablet Take 2 tablets by mouth every 4 (four) hours as needed for severe pain. 12/07/13   Kaitlyn Szekalski, PA-C   BP 134/91 mmHg  Pulse 91  Temp(Src) 98.6 F (37 C) (Oral)  Resp 19  SpO2 100% Physical Exam  Constitutional: She is oriented to person, place, and time. She appears well-developed and well-nourished.  HENT:  Head: Normocephalic and atraumatic.  Right Ear: Tympanic membrane, external ear and ear canal normal.  Left Ear: Tympanic membrane, external ear and ear canal normal.  Nose: Nose normal.  Mouth/Throat: Oropharynx is clear and moist. No oropharyngeal exudate.  Eyes: Conjunctivae and EOM are normal. Pupils are equal, round, and reactive to light. No scleral icterus.  Neck: Normal range of motion. Neck supple. No tracheal deviation present. No thyromegaly present.  Cardiovascular: Normal rate, regular rhythm, normal heart sounds and intact distal pulses.  Exam reveals no gallop and no  friction rub.   No murmur heard. Pulmonary/Chest: Effort normal and breath sounds normal. No respiratory distress.  Abdominal: Soft. Bowel sounds are normal. She exhibits no distension. There is tenderness in the epigastric area. There is no rebound and no guarding.  Musculoskeletal: Normal range of motion. She exhibits no edema.  Neurological: She is alert and oriented to  person, place, and time. No cranial nerve deficit. She exhibits normal muscle tone.  Skin: Skin is warm and dry. No rash noted. She is not diaphoretic. No erythema.  Psychiatric: She has a normal mood and affect.  Nursing note and vitals reviewed.   ED Course  Procedures (including critical care time) Labs Review Labs Reviewed  CBC WITH DIFFERENTIAL/PLATELET - Abnormal; Notable for the following:    WBC 10.7 (*)    Monocytes Relative 15 (*)    Monocytes Absolute 1.6 (*)    All other components within normal limits  COMPREHENSIVE METABOLIC PANEL - Abnormal; Notable for the following:    Sodium 134 (*)    Glucose, Bld 120 (*)    Albumin 3.1 (*)    AST 258 (*)    ALT 187 (*)    Alkaline Phosphatase 229 (*)    Total Bilirubin 2.1 (*)    All other components within normal limits  URINALYSIS, ROUTINE W REFLEX MICROSCOPIC - Abnormal; Notable for the following:    Color, Urine AMBER (*)    APPearance TURBID (*)    Hgb urine dipstick MODERATE (*)    Ketones, ur 15 (*)    Protein, ur 100 (*)    Urobilinogen, UA >8.0 (*)    Nitrite POSITIVE (*)    Leukocytes, UA LARGE (*)    All other components within normal limits  URINE MICROSCOPIC-ADD ON - Abnormal; Notable for the following:    Squamous Epithelial / LPF FEW (*)    Bacteria, UA MANY (*)    All other components within normal limits  URINE CULTURE  LIPASE, BLOOD    Imaging Review [place] Abdomen Complete  11/10/2014   CLINICAL DATA:  Abdominal pain, vomiting, diarrhea for 3 days  EXAM: ULTRASOUND ABDOMEN COMPLETE  COMPARISON:  None.  FINDINGS: Gallbladder: No gallstones or wall thickening visualized. No sonographic Murphy sign noted.  Common bile duct: Diameter: 4 mm in diameter within normal limits.  Liver: No focal lesion identified. Within normal limits in parenchymal echogenicity.  IVC: No abnormality visualized.  Pancreas: Visualized portion unremarkable.  Spleen: Size and appearance within normal limits. Measures 8 cm in length.   Right Kidney: Length: 12 cm. Echogenicity within normal limits. No mass or hydronephrosis visualized.  Left Kidney: Length: 12.5 cm. Echogenicity within normal limits. No mass or hydronephrosis visualized.  Abdominal aorta: No aneurysm visualized. Measures up to 2 cm in diameter.  Other findings: None.  IMPRESSION: 1. No gallstones are noted within gallbladder. 2. Normal CBD. 3. No hydronephrosis. 4. No aortic aneurysm.   Electronically Signed   By: Natasha MeadLiviu  Pop M.D.   On: 11/10/2014 12:56   Ct Abdomen Pelvis W Contrast  11/10/2014   CLINICAL DATA:  Nausea, dizziness, vomiting, and stomach cramps.  EXAM: CT ABDOMEN AND PELVIS WITH CONTRAST  TECHNIQUE: Multidetector CT imaging of the abdomen and pelvis was performed using the standard protocol following bolus administration of intravenous contrast.  CONTRAST:  80mL OMNIPAQUE IOHEXOL 300 MG/ML  SOLN  COMPARISON:  None.  FINDINGS: Lower chest:  Mild right heart enlargement.  Hepatobiliary: Unremarkable  Pancreas: Unremarkable  Spleen: Unremarkable  Adrenals/Urinary Tract: Patchy  regions of abnormal hypo enhancement in the left kidney, largest 4.1 by 3.1 by 4.8 cm on image 80 of series 2, but also with separate wedge like areas of abnormal enhancement throughout the left kidney. Similar but less striking regions in the right kidney, for example on images 12 and 27 of series 7. No obvious renal vascular abnormality is observed. No hydronephrosis or significant hydroureter. However, there is diffuse wall thickening in the urinary bladder. On the portal venous phase images we do not demonstrate evidence of renal vein thrombosis.  Stomach/Bowel: Scattered air-fluid levels in nondilated distal loops of small bowel. Appendix normal.  Vascular/Lymphatic: Unremarkable  Reproductive: Multiple intramural uterine fibroids.  Other: No supplemental non-categorized findings.  Musculoskeletal: Bony spurring in the pelvis. Lumbar spondylosis and degenerative disc disease potentially  causing some mild foraminal narrowing at L4-5 and L5-S1. Suspected right posterior back subcutaneous lipoma at the thoracolumbar junction level, 10.9 by 2.0 by 8.9 cm in size.  IMPRESSION: 1. Abnormal bilateral patchy regions of hypoenhancement in both kidneys, but much greater on the left than the right, with abnormal urinary bladder wall thickening. Appearance favors cystitis with bilateral pyelonephritis. Infiltrative renal tumor considered unlikely given the bilaterality. Renal infarcts are considered less likely given the general appearance. I do not show evidence of renal arterial abnormality or renal vein thrombosis. Correlate with urine analysis and urine microscopy/culture. 2. Mild right heart enlargement. 3. Scattered air-fluid levels in nondilated distal loops of small bowel, favoring mild ileus. 4. Other findings include intramural uterine fibroids, lumbar spondylosis and degenerative disc disease causing mild foraminal narrowing at the L4-5 and L5-S1 levels, and a right posterior back subcutaneous lipoma at the level of the thoracolumbar junction.   Electronically Signed   By: Gaylyn Rong M.D.   On: 11/10/2014 16:13     The patient has tolerated oral fluids and is feeling better.  We will treat her for early, pyelonephritis.  Told to return here as needed.  Follow-up with her primary care doctor  Charlestine Night, PA-C 11/11/14 1701  Purvis Sheffield, MD 11/11/14 2219

## 2014-11-10 NOTE — ED Notes (Signed)
Pt is in stable condition upon d/c and ambulates from ED. Pt verbalizes understanding rt d/c medications and instructions.

## 2014-11-10 NOTE — ED Notes (Signed)
[facility]

## 2014-11-10 NOTE — ED Provider Notes (Signed)
Pt completed antibiotics,  Discharge instructions and prescriptions given by rn.  Lonia SkinnerLeslie K ArtesiaSofia, PA-C 11/10/14 2125  Samuel JesterKathleen McManus, DO 11/11/14 1357

## 2014-11-10 NOTE — ED Notes (Addendum)
Called lab rt lipase not shown as in process.

## 2014-11-10 NOTE — ED Notes (Signed)
Courtney from CT informed pt has finished contrast.

## 2014-11-10 NOTE — ED Notes (Signed)
Called lab to add on lipase.  

## 2014-11-10 NOTE — Discharge Instructions (Signed)
Return here as needed.  Follow-up with your primary care doctor.  Slowly increase your fluid intake °

## 2014-11-12 LAB — URINE CULTURE: Colony Count: >=100000

## 2014-11-13 ENCOUNTER — Telehealth (HOSPITAL_BASED_OUTPATIENT_CLINIC_OR_DEPARTMENT_OTHER): Payer: Self-pay | Admitting: Emergency Medicine

## 2014-11-13 NOTE — Telephone Encounter (Signed)
Post ED Visit - Positive Culture Follow-up  Culture report reviewed by antimicrobial stewardship pharmacist: []  Wes Dulaney, Pharm.D., BCPS []  Celedonio MiyamotoJeremy Frens, Pharm.D., BCPS []  Georgina PillionElizabeth Martin, Pharm.D., BCPS [x]  [place] Pham, [place]Pharm[place]D., BCPS, AAHIVP []  Estella HuskMichelle Turner, Pharm.D., BCPS, AAHIVP []  Elder CyphersLorie Poole, [address] Rainbow BoulevardPharm.D., BCPS  Positive urine culture E. Coli Treated with ciprofloxacin, organism sensitive to the same and no further patient follow-up is required at this time.  Berle MullMiller, Jaquila Santelli 11/13/2014, 9:44 AM

## 2015-05-10 ENCOUNTER — Encounter (HOSPITAL_COMMUNITY): Payer: Self-pay | Admitting: *Deleted

## 2015-05-10 ENCOUNTER — Emergency Department (HOSPITAL_COMMUNITY)
Admission: EM | Admit: 2015-05-10 | Discharge: 2015-05-10 | Disposition: A | Discharge status: Home / Self Care | Payer: Medicare Other | Attending: Emergency Medicine | Admitting: Emergency Medicine

## 2015-05-10 DIAGNOSIS — F419 Anxiety disorder, unspecified: Secondary | ICD-10-CM | POA: Diagnosis not present

## 2015-05-10 DIAGNOSIS — I10 Essential (primary) hypertension: Secondary | ICD-10-CM | POA: Insufficient documentation

## 2015-05-10 DIAGNOSIS — Z720 Tobacco use: Secondary | ICD-10-CM | POA: Diagnosis not present

## 2015-05-10 DIAGNOSIS — F329 Major depressive disorder, single episode, unspecified: Secondary | ICD-10-CM | POA: Insufficient documentation

## 2015-05-10 MED ORDER — LISINOPRIL 5 MG PO TABS: 5.0000 mg | ORAL_TABLET | Freq: Every day | ORAL | Status: DC

## 2015-05-10 NOTE — ED Provider Notes (Signed)
CSN: [id number]     Arrival date & time 05/10/15  1843 History   First MD Initiated Contact with Patient 05/10/15 2216     Chief Complaint  Patient presents with  . Hypertension    (Consider location/radiation/quality/duration/timing/severity/associated sxs/prior Treatment) HPI Comments: 54 year old female with a history of anxiety and depression presents to the emergency department for evaluation of high blood pressure. Patient states that she went to the family service and her to see if they had a job that she could volunteer for to have something to do during the day. She reports talking with one of the staff members there who inquired about her blood pressure. She states that she had her blood pressure checked at this time with a reading of 208/120. Patient denies upper taking medication for her blood pressure. Blood pressure on arrival to the ED was 166/115, since improved to 161/89. Patient denies any chest pain or shortness of breath today. She has had no syncope, headache, vision changes, or vision loss.  PCP - [facility], Dr. Concepcion Elk  Patient is a 54 y.o. female presenting with hypertension. The history is provided by the patient. No language interpreter was used.  Hypertension Pertinent negatives include no chest pain, nausea or vomiting.    Past Medical History  Diagnosis Date  . Hypertension   . Anxiety   . Depression    History reviewed. No pertinent past surgical history. No family history on file. Social History  Substance Use Topics  . Smoking status: Current Every Day Smoker -- 0.25 packs/day    Types: Cigarettes  . Smokeless tobacco: None  . Alcohol Use: Yes     Comment: occasion   OB History    No data available      Review of Systems  Eyes: Negative for visual disturbance.  Respiratory: Negative for shortness of breath.   Cardiovascular: Negative for chest pain.  Gastrointestinal: Negative for nausea and vomiting.  Neurological: Negative for syncope.   All other systems reviewed and are negative.   Allergies  Review of patient's allergies indicates no known allergies.  Home Medications   Prior to Admission medications   Medication Sig Start Date End Date Taking? Authorizing Provider  ciprofloxacin (CIPRO) 500 MG tablet Take 1 tablet (500 mg total) by mouth 2 (two) times daily. Patient not taking: Reported on 05/10/2015 11/10/14   Charlestine Night, PA-C  HYDROcodone-acetaminophen (NORCO/VICODIN) 5-325 MG per tablet Take 1 tablet by mouth every 6 (six) hours as needed for moderate pain. Patient not taking: Reported on 05/10/2015 11/10/14   Charlestine Night, PA-C  lisinopril (PRINIVIL,ZESTRIL) 5 MG tablet Take 1 tablet (5 mg total) by mouth daily. 05/10/15   Antony Madura, PA-C  oxyCODONE-acetaminophen (PERCOCET/ROXICET) 5-325 MG per tablet Take 2 tablets by mouth every 4 (four) hours as needed for severe pain. Patient not taking: Reported on 05/10/2015 12/07/13   Emilia Beck, PA-C  promethazine (PHENERGAN) 25 MG tablet Take 1 tablet (25 mg total) by mouth every 8 (eight) hours as needed for nausea or vomiting. Patient not taking: Reported on 05/10/2015 11/10/14   Charlestine Night, PA-C   BP 161/89 mmHg  Pulse 53  Temp(Src) 98 F (36.7 C) (Oral)  Resp 16  Ht  (1.575 m)  Wt 148 lb (67.132 kg)  BMI 27.06 kg/m2  SpO2 100%   Physical Exam  Constitutional: She is oriented to person, place, and time. She appears well-developed and well-nourished. No distress.  Nontoxic/nonseptic appearing  HENT:  Head: Normocephalic and atraumatic.  Eyes:  Conjunctivae and EOM are normal. Pupils are equal, round, and reactive to light. No scleral icterus.  Neck: Normal range of motion.  Cardiovascular: Normal rate, regular rhythm and intact distal pulses.   Pulmonary/Chest: Effort normal and breath sounds normal. No respiratory distress. She has no wheezes. She has no rales.  Respirations even and unlabored  Musculoskeletal: Normal range of  motion.  Neurological: She is alert and oriented to person, place, and time. She exhibits normal muscle tone. Coordination normal.  GCS 15. Patient moves extremities without ataxia.  Skin: Skin is warm and dry. No rash noted. She is not diaphoretic. No erythema. No pallor.  Psychiatric: She has a normal mood and affect. Her behavior is normal.  Nursing note and vitals reviewed.   ED Course  Procedures (including critical care time) Labs Review Labs Reviewed - No data to display  Imaging Review No results found.   I have personally reviewed and evaluated these images and lab results as part of my medical decision-making.   EKG Interpretation None      MDM   Final diagnoses:  Essential hypertension    53 year old female presents to the emergency department for evaluation of hypertension. Blood pressure documented to be 208/120 prior to arrival, improved to 166/115 in triage. Blood pressure has since lowered to 161/89. Patient has had no complaints of chest pain or shortness of breath. No headache or vision changes. Physical exam is reassuring. Patient to be started on low-dose Lisinopril with instruction to follow-up with her primary care doctor for recheck of her blood pressure in one week. Return precautions given at discharge. Patient discharged in good condition; VSS.   Filed Vitals:   05/10/15 2119 05/10/15 2230 05/10/15 2231 05/10/15 2259  BP: 148/93 161/89 161/89   Pulse: 53 62 53   Temp: 98.5 F (36.9 C)  98.8 F (37.1 C) 98 F (36.7 C)  TempSrc: Oral  Oral   Resp: 16     Height:      Weight:      SpO2: 100% 100% 100%      Antony Madura, PA-C 05/10/15 2309  Arby Barrette, MD 05/11/15 6962

## 2015-05-10 NOTE — ED Notes (Signed)
Pt now states that she gets mad at her sister but would never hurt her. Now denies HI.

## 2015-05-10 NOTE — ED Notes (Signed)
Pt states "I have no money to get this filled until the 3rd that is when I get my money."

## 2015-05-10 NOTE — Discharge Instructions (Signed)

## 2015-05-10 NOTE — ED Notes (Signed)
[facility] and had her BP checked, 208/120 while there. Denies hx of HTN.

## 2015-05-12 NOTE — Care Management (Addendum)
Michel Bickers, RN Registered Nurse Signed CASE MANAGEMENT Care Management 05/12/2015 5:06 PM    Expand All Collapse All   ED CM called to ED lobby by Unit Secretary concerning patient. She was seen here last night for CP and elevated B/P given a presecription for lisinopril that she states, she could not afford. Patient has Medicaid, CM spoke with patient instructing her to go to her pharmacy and ask to have co-pay waived, patient verbalized understanding and says she will do that. CM call [facility] co- pay will be waived for lisinopril.

## 2015-06-15 ENCOUNTER — Emergency Department (HOSPITAL_COMMUNITY)
Admission: EM | Admit: 2015-06-15 | Discharge: 2015-06-15 | Disposition: A | Discharge status: Home / Self Care | Payer: Medicare Other | Attending: Emergency Medicine | Admitting: Emergency Medicine

## 2015-06-15 ENCOUNTER — Encounter (HOSPITAL_COMMUNITY): Payer: Self-pay | Admitting: Physical Medicine and Rehabilitation

## 2015-06-15 ENCOUNTER — Emergency Department (HOSPITAL_COMMUNITY): Payer: Medicare Other

## 2015-06-15 DIAGNOSIS — I10 Essential (primary) hypertension: Secondary | ICD-10-CM | POA: Diagnosis not present

## 2015-06-15 DIAGNOSIS — J309 Allergic rhinitis, unspecified: Secondary | ICD-10-CM | POA: Insufficient documentation

## 2015-06-15 DIAGNOSIS — H7493 Unspecified disorder of middle ear and mastoid, bilateral: Secondary | ICD-10-CM | POA: Diagnosis not present

## 2015-06-15 DIAGNOSIS — Z720 Tobacco use: Secondary | ICD-10-CM | POA: Diagnosis not present

## 2015-06-15 DIAGNOSIS — Z79899 Other long term (current) drug therapy: Secondary | ICD-10-CM | POA: Diagnosis not present

## 2015-06-15 DIAGNOSIS — R51 Headache: Secondary | ICD-10-CM | POA: Insufficient documentation

## 2015-06-15 DIAGNOSIS — J069 Acute upper respiratory infection, unspecified: Secondary | ICD-10-CM | POA: Diagnosis not present

## 2015-06-15 DIAGNOSIS — R05 Cough: Secondary | ICD-10-CM | POA: Diagnosis present

## 2015-06-15 MED ORDER — FLUTICASONE PROPIONATE 50 MCG/ACT NA SUSP: 2.0000 | Freq: Every day | NASAL | Status: DC

## 2015-06-15 MED ORDER — BENZONATATE 100 MG PO CAPS: 100.0000 mg | ORAL_CAPSULE | Freq: Three times a day (TID) | ORAL | Status: DC

## 2015-06-15 MED ORDER — CLOTRIMAZOLE 1 % EX CREA: TOPICAL_CREAM | CUTANEOUS | Status: DC

## 2015-06-15 MED ORDER — CETIRIZINE HCL 10 MG PO TABS: 10.0000 mg | ORAL_TABLET | Freq: Every day | ORAL | Status: DC

## 2015-06-15 NOTE — ED Notes (Signed)
Pt reports dry cough and sinus congestion for several weeks. Respirations unlabored. NAD

## 2015-06-15 NOTE — Discharge Instructions (Signed)
Upper Respiratory Infection, Adult °Most upper respiratory infections (URIs) are a viral infection of the air passages leading to the lungs. A URI affects the nose, throat, and upper air passages. The most common type of URI is nasopharyngitis and is typically referred to as "the common cold." °URIs run their course and usually go away on their own. Most of the time, a URI does not require medical attention, but sometimes a bacterial infection in the upper airways can follow a viral infection. This is called a secondary infection. Sinus and middle ear infections are common types of secondary upper respiratory infections. °Bacterial pneumonia can also complicate a URI. A URI can worsen asthma and chronic obstructive pulmonary disease (COPD). Sometimes, these complications can require emergency medical care and may be life threatening.  °CAUSES °Almost all URIs are caused by viruses. A virus is a type of germ and can spread from one person to another.  °RISKS FACTORS °You may be at risk for a URI if:  °· You smoke.   °· You have chronic heart or lung disease. °· You have a weakened defense (immune) system.   °· You are very young or very old.   °· You have nasal allergies or asthma. °· You work in crowded or poorly ventilated areas. °· You work in health care facilities or schools. °SIGNS AND SYMPTOMS  °Symptoms typically develop 2-3 days after you come in contact with a cold virus. Most viral URIs last 7-10 days. However, viral URIs from the influenza virus (flu virus) can last 14-18 days and are typically more severe. Symptoms may include:  °· Runny or stuffy (congested) nose.   °· Sneezing.   °· Cough.   °· Sore throat.   °· Headache.   °· Fatigue.   °· Fever.   °· Loss of appetite.   °· Pain in your forehead, behind your eyes, and over your cheekbones (sinus pain). °· Muscle aches.   °DIAGNOSIS  °Your health care provider may diagnose a URI by: °· Physical exam. °· Tests to check that your symptoms are not due to  another condition such as: °· Strep throat. °· Sinusitis. °· Pneumonia. °· Asthma. °TREATMENT  °A URI goes away on its own with time. It cannot be cured with medicines, but medicines may be prescribed or recommended to relieve symptoms. Medicines may help: °· Reduce your fever. °· Reduce your cough. °· Relieve nasal congestion. °HOME CARE INSTRUCTIONS  °· Take medicines only as directed by your health care provider.   °· Gargle warm saltwater or take cough drops to comfort your throat as directed by your health care provider. °· Use a warm mist humidifier or inhale steam from a shower to increase air moisture. This may make it easier to breathe. °· Drink enough fluid to keep your urine clear or pale yellow.   °· Eat soups and other clear broths and maintain good nutrition.   °· Rest as needed.   °· Return to work when your temperature has returned to normal or as your health care provider advises. You may need to stay home longer to avoid infecting others. You can also use a face mask and careful hand washing to prevent spread of the virus. °· Increase the usage of your inhaler if you have asthma.   °· Do not use any tobacco products, including cigarettes, chewing tobacco, or electronic cigarettes. If you need help quitting, ask your health care provider. °PREVENTION  °The best way to protect yourself from getting a cold is to practice good hygiene.  °· Avoid oral or hand contact with people with cold   symptoms.   °· Wash your hands often if contact occurs.   °There is no clear evidence that vitamin C, vitamin E, echinacea, or exercise reduces the chance of developing a cold. However, it is always recommended to get plenty of rest, exercise, and practice good nutrition.  °SEEK MEDICAL CARE IF:  °· You are getting worse rather than better.   °· Your symptoms are not controlled by medicine.   °· You have chills. °· You have worsening shortness of breath. °· You have brown or red mucus. °· You have yellow or brown nasal  discharge. °· You have pain in your face, especially when you bend forward. °· You have a fever. °· You have swollen neck glands. °· You have pain while swallowing. °· You have white areas in the back of your throat. °SEEK IMMEDIATE MEDICAL CARE IF:  °· You have severe or persistent: °¨ Headache. °¨ Ear pain. °¨ Sinus pain. °¨ Chest pain. °· You have chronic lung disease and any of the following: °¨ Wheezing. °¨ Prolonged cough. °¨ Coughing up blood. °¨ A change in your usual mucus. °· You have a stiff neck. °· You have changes in your: °¨ Vision. °¨ Hearing. °¨ Thinking. °¨ Mood. °MAKE SURE YOU:  °· Understand these instructions. °· Will watch your condition. °· Will get help right away if you are not doing well or get worse. °  °This information is not intended to replace advice given to you by your health care provider. Make sure you discuss any questions you have with your health care provider. °  °Document Released: 01/31/2001 Document Revised: 12/22/2014 Document Reviewed: 11/12/2013 °Elsevier Interactive Patient Education ©2016 [employer] °Allergic Rhinitis °Allergic rhinitis is when the mucous membranes in the nose respond to allergens. Allergens are particles in the air that cause your body to have an allergic reaction. This causes you to release allergic antibodies. Through a chain of events, these eventually cause you to release histamine into the blood stream. Although meant to protect the body, it is this release of histamine that causes your discomfort, such as frequent sneezing, congestion, and an itchy, runny nose.  °CAUSES °Seasonal allergic rhinitis (hay fever) is caused by pollen allergens that may come from grasses, trees, and weeds. Year-round allergic rhinitis (perennial allergic rhinitis) is caused by allergens such as house dust mites, pet dander, and mold spores. °SYMPTOMS °· Nasal stuffiness (congestion). °· Itchy, runny nose with sneezing and tearing of the eyes. °DIAGNOSIS °Your health  care provider can help you determine the allergen or allergens that trigger your symptoms. If you and your health care provider are unable to determine the allergen, skin or blood testing may be used. Your health care provider will diagnose your condition after taking your health history and performing a physical exam. Your health care provider may assess you for other related conditions, such as asthma, pink eye, or an ear infection. °TREATMENT °Allergic rhinitis does not have a cure, but it can be controlled by: °· Medicines that block allergy symptoms. These may include allergy shots, nasal sprays, and oral antihistamines. °· Avoiding the allergen. °Hay fever may often be treated with antihistamines in pill or nasal spray forms. Antihistamines block the effects of histamine. There are over-the-counter medicines that may help with nasal congestion and swelling around the eyes. Check with your health care provider before taking or giving this medicine. °If avoiding the allergen or the medicine prescribed do not work, there are many new medicines your health care provider can prescribe. Stronger medicine   may be used if initial measures are ineffective. Desensitizing injections can be used if medicine and avoidance does not work. Desensitization is when a patient is given ongoing shots until the body becomes less sensitive to the allergen. Make sure you follow up with your health care provider if problems continue. °HOME CARE INSTRUCTIONS °It is not possible to completely avoid allergens, but you can reduce your symptoms by taking steps to limit your exposure to them. It helps to know exactly what you are allergic to so that you can avoid your specific triggers. °SEEK MEDICAL CARE IF: °· You have a fever. °· You develop a cough that does not stop easily (persistent). °· You have shortness of breath. °· You start wheezing. °· Symptoms interfere with normal daily activities. °  °This information is not intended to  replace advice given to you by your health care provider. Make sure you discuss any questions you have with your health care provider. °  °Document Released: 05/02/2001 Document Revised: 08/28/2014 Document Reviewed: 04/14/2013 °Elsevier Interactive Patient Education ©2016 [employer] ° °

## 2015-06-15 NOTE — ED Provider Notes (Signed)
CSN: [id number]     Arrival date & time 06/15/15  1319 History  By signing my name below, I, Gina Richards, attest that this documentation has been prepared under the direction and in the presence of Will Jamal Pavon, PA-C Electronically Signed: Jarvis Richards, ED Scribe. 06/15/2015. 4:23 PM.    Chief Complaint  Patient presents with  . Cough   The history is provided by the patient. No language interpreter was used.    HPI Comments: Gina Richards is a 54 y.o. female who presents to the Emergency Department complaining of intermittent, dry cough onset 4-5 weeks. She reports associated sinus pressure, HA, congestion, sore throat, PND, itchy and watery eyes, and chills. She states the cough has been keeping her up at night. She has not taken any medications PTA. Pt is a current everyday smoker with 0.25 ppd history. She denies any trouble swallowing, nausea, vomiting, diarrhea, ear pressure or pain, ear drainage, fever, chills, chest pain, SOB or wheezing.   Pt has a second complaint of a circular, itchy rash to her right forearm. She is unsure of the cause. She has not applied any medications or creams to the area.   Past Medical History  Diagnosis Date  . Hypertension   . Anxiety   . Depression    History reviewed. No pertinent past surgical history. No family history on file. Social History  Substance Use Topics  . Smoking status: Current Every Day Smoker -- 0.25 packs/day    Types: Cigarettes  . Smokeless tobacco: None  . Alcohol Use: Yes   OB History    No data available     Review of Systems  Constitutional: Negative for fever and chills.  HENT: Positive for congestion, postnasal drip, rhinorrhea, sinus pressure, sneezing and sore throat. Negative for drooling, ear discharge, ear pain and trouble swallowing.   Eyes: Negative for visual disturbance.  Respiratory: Positive for cough. Negative for shortness of breath and wheezing.   Gastrointestinal: Negative for nausea,  vomiting and diarrhea.  Skin: Positive for rash.  Neurological: Positive for headaches. Negative for syncope, weakness and light-headedness.      Allergies  Review of patient's allergies indicates no known allergies.  Home Medications   Prior to Admission medications   Medication Sig Start Date End Date Taking? Authorizing Provider  benzonatate (TESSALON) 100 MG capsule Take 1 capsule (100 mg total) by mouth every 8 (eight) hours. 06/15/15   Everlene Farrier, PA-C  cetirizine (ZYRTEC ALLERGY) 10 MG tablet Take 1 tablet (10 mg total) by mouth daily. 06/15/15   Everlene Farrier, PA-C  clotrimazole (LOTRIMIN) 1 % cream Apply to affected area 2 times daily 06/15/15   Everlene Farrier, PA-C  fluticasone [facility]) 50 MCG/ACT nasal spray Place 2 sprays into both nostrils daily. 06/15/15   Everlene Farrier, PA-C  lisinopril (PRINIVIL,ZESTRIL) 5 MG tablet Take 1 tablet (5 mg total) by mouth daily. 05/10/15   Antony Madura, PA-C   Triage Vitals: BP 142/83 mmHg  Pulse 57  Temp(Src) 98.3 F (36.8 C) (Oral)  Resp 16  Wt 144 lb (65.318 kg)  SpO2 99%  Physical Exam  Constitutional: She is oriented to person, place, and time. She appears well-developed and well-nourished. No distress.  Nontoxic-appearing.  HENT:  Head: Normocephalic and atraumatic.  Right Ear: External ear normal. Tympanic membrane is not erythematous. A middle ear effusion is present.  Left Ear: External ear normal. Tympanic membrane is not erythematous. A middle ear effusion is present.  Mouth/Throat: Uvula is midline and oropharynx is  clear and moist.  Bilateral mid ear effusion, no erythema or loss of landmarks No tonsillar hypertrophy or exudates. Boggy nasal turbinates bilaterally.  Eyes: Conjunctivae are normal. Pupils are equal, round, and reactive to light. Right eye exhibits no discharge. Left eye exhibits no discharge.  Neck: Normal range of motion. Neck supple.  Cardiovascular: Normal rate, regular rhythm, normal heart  sounds and intact distal pulses.   Pulmonary/Chest: Effort normal and breath sounds normal. No respiratory distress. She has no wheezes. She has no rales. She exhibits tenderness.  Lungs are clear to auscultation bilaterally.  Abdominal: Soft. Bowel sounds are normal. There is no tenderness. There is no guarding.  Musculoskeletal: She exhibits no edema or tenderness.  No lower extremity edema or tenderness.  Lymphadenopathy:    She has no cervical adenopathy.  Neurological: She is alert and oriented to person, place, and time. Coordination normal.  Skin: Skin is warm and dry. No rash noted. She is not diaphoretic. No erythema. No pallor.  Psychiatric: She has a normal mood and affect. Her behavior is normal.  Nursing note and vitals reviewed.   ED Course  Procedures (including critical care time)  DIAGNOSTIC STUDIES: Oxygen Saturation is 99% on RA, normal by my interpretation.    COORDINATION OF CARE:  2:52 PM- Will order CXR.  Pt advised of plan for treatment and pt agrees.    Labs Review Labs Reviewed - No data to display  Imaging Review Dg Chest 2 View  06/15/2015  CLINICAL DATA:  Productive cough for 4 weeks EXAM: CHEST  2 VIEW COMPARISON:  09/16/2006 FINDINGS: Chronic cardiomegaly with stable aortic and hilar contours. There is no edema, consolidation, effusion, or pneumothorax. No significant osseous findings. IMPRESSION: 1. No acute finding. 2. Chronic cardiomegaly. Electronically Signed   By: Marnee Spring M.D.   On: 06/15/2015 15:28   I have personally reviewed and evaluated these images and lab results as part of my medical decision-making.   EKG Interpretation None      Filed Vitals:   06/15/15 1338 06/15/15 1556  BP: 142/83 155/96  Pulse: 57 102  Temp: 98.3 F (36.8 C)   TempSrc: Oral   Resp: 16 16  Weight: 144 lb (65.318 kg)   SpO2: 99% 99%     MDM   Meds given in ED:  Medications - No data to display  Discharge Medication List as of 06/15/2015   3:53 PM    START taking these medications   Details  benzonatate (TESSALON) 100 MG capsule Take 1 capsule (100 mg total) by mouth every 8 (eight) hours., Starting 06/15/2015, Until Discontinued, Print    cetirizine (ZYRTEC ALLERGY) 10 MG tablet Take 1 tablet (10 mg total) by mouth daily., Starting 06/15/2015, Until Discontinued, Print    clotrimazole (LOTRIMIN) 1 % cream Apply to affected area 2 times daily, Print    fluticasone (FLONASE) 50 MCG/ACT nasal spray Place 2 sprays into both nostrils daily., Starting 06/15/2015, Until Discontinued, Print        Final diagnoses:  URI (upper respiratory infection)  Allergic rhinitis, unspecified allergic rhinitis type    This  is a 54 y.o. female who presents to the Emergency Department complaining of intermittent, dry cough onset 4-5 weeks. She reports associated sinus pressure, HA, congestion, sore throat, PND, itchy and watery eyes, and chills.  On exam the patient is afebrile nontoxic appearing. Her oxygen saturation is 99% on room air. Her lungs are clear to auscultation bilaterally. No wheezes or rhonchi noted. She has  boggy nasal turbinates noted bilaterally. Patient is requesting chest x-ray. Chest x-ray revealed no acute finding. We'll treat the patient for an upper x-ray infection and allergic rhinitis. Patient given prescriptions for Zyrtec, Flonase and Tessalon Perles. I advised the patient to follow-up with their primary care provider this week. I advised the patient to return to the emergency department with new or worsening symptoms or new concerns. The patient verbalized understanding and agreement with plan.    I personally performed the services described in this documentation, which was scribed in my presence. The recorded information has been reviewed and is accurate.       Everlene FarrierWilliam Tangie Stay, PA-C 06/15/15 1624  Tilden FossaElizabeth Rees, MD 06/15/15 [phone]

## 2015-07-05 ENCOUNTER — Emergency Department (HOSPITAL_COMMUNITY)
Admission: EM | Admit: 2015-07-05 | Discharge: 2015-07-05 | Disposition: A | Discharge status: Home / Self Care | Payer: Medicare Other | Attending: Emergency Medicine | Admitting: Emergency Medicine

## 2015-07-05 ENCOUNTER — Emergency Department (HOSPITAL_COMMUNITY): Payer: Medicare Other

## 2015-07-05 ENCOUNTER — Encounter (HOSPITAL_COMMUNITY): Payer: Self-pay

## 2015-07-05 DIAGNOSIS — R079 Chest pain, unspecified: Secondary | ICD-10-CM | POA: Insufficient documentation

## 2015-07-05 DIAGNOSIS — IMO0001 Reserved for inherently not codable concepts without codable children: Secondary | ICD-10-CM

## 2015-07-05 DIAGNOSIS — Z720 Tobacco use: Secondary | ICD-10-CM | POA: Insufficient documentation

## 2015-07-05 DIAGNOSIS — F17210 Nicotine dependence, cigarettes, uncomplicated: Secondary | ICD-10-CM | POA: Diagnosis not present

## 2015-07-05 DIAGNOSIS — I10 Essential (primary) hypertension: Secondary | ICD-10-CM | POA: Diagnosis not present

## 2015-07-05 DIAGNOSIS — R42 Dizziness and giddiness: Secondary | ICD-10-CM | POA: Diagnosis present

## 2015-07-05 DIAGNOSIS — J40 Bronchitis, not specified as acute or chronic: Secondary | ICD-10-CM | POA: Diagnosis not present

## 2015-07-05 DIAGNOSIS — R030 Elevated blood-pressure reading, without diagnosis of hypertension: Secondary | ICD-10-CM | POA: Insufficient documentation

## 2015-07-05 DIAGNOSIS — Z7951 Long term (current) use of inhaled steroids: Secondary | ICD-10-CM | POA: Diagnosis not present

## 2015-07-05 DIAGNOSIS — Z8659 Personal history of other mental and behavioral disorders: Secondary | ICD-10-CM | POA: Insufficient documentation

## 2015-07-05 DIAGNOSIS — R05 Cough: Secondary | ICD-10-CM | POA: Diagnosis not present

## 2015-07-05 DIAGNOSIS — Z79899 Other long term (current) drug therapy: Secondary | ICD-10-CM | POA: Diagnosis not present

## 2015-07-05 LAB — CBC
HEMATOCRIT: 42.3 % (ref 36.0–46.0)
Hemoglobin: 14 g/dL (ref 12.0–15.0)
MCH: 32.3 pg (ref 26.0–34.0)
MCHC: 33.1 g/dL (ref 30.0–36.0)
MCV: 97.7 fL (ref 78.0–100.0)
Platelets: 262 10*3/uL (ref 150–400)
RBC: 4.33 MIL/uL (ref 3.87–5.11)
RDW: 14.1 % (ref 11.5–15.5)
WBC: 4.9 10*3/uL (ref 4.0–10.5)

## 2015-07-05 LAB — BASIC METABOLIC PANEL
Anion gap: 9 (ref 5–15)
BUN: 19 mg/dL (ref 6–20)
CHLORIDE: 107 mmol/L (ref 101–111)
CO2: 23 mmol/L (ref 22–32)
CREATININE: 0.76 mg/dL (ref 0.44–1.00)
Calcium: 9.2 mg/dL (ref 8.9–10.3)
GFR calc non Af Amer: >60 mL/min (ref >60)
Glucose, Bld: 83 mg/dL (ref 65–99)
POTASSIUM: 4.7 mmol/L (ref 3.5–5.1)
Sodium: 139 mmol/L (ref 135–145)

## 2015-07-05 LAB — I-STAT TROPONIN, ED
TROPONIN I, POC: 0 ng/mL (ref 0.00–0.08)
Troponin i, poc: 0 ng/mL (ref 0.00–0.08)

## 2015-07-05 MED ORDER — LISINOPRIL 5 MG PO TABS: 5.0000 mg | ORAL_TABLET | Freq: Every day | ORAL | Status: DC

## 2015-07-05 NOTE — ED Notes (Signed)
PA at the bedside.

## 2015-07-05 NOTE — ED Notes (Signed)
Phlebotomy at the bedside  

## 2015-07-05 NOTE — Discharge Instructions (Signed)
1. Medications: usual home medications 2. Treatment: rest, drink plenty of fluids 3. Follow Up: please followup with your primary doctor this week days for discussion of your diagnoses and further evaluation after today's visit; if you do not have a primary care doctor use the resource guide provided to find one; please return to the ER for high fever, severe chest pain or shortness of breath, dizziness, loss of consciousness, new or worsening symptoms   Upper Respiratory Infection, Adult Most upper respiratory infections (URIs) are a viral infection of the air passages leading to the lungs. A URI affects the nose, throat, and upper air passages. The most common type of URI is nasopharyngitis and is typically referred to as "the common cold." URIs run their course and usually go away on their own. Most of the time, a URI does not require medical attention, but sometimes a bacterial infection in the upper airways can follow a viral infection. This is called a secondary infection. Sinus and middle ear infections are common types of secondary upper respiratory infections. Bacterial pneumonia can also complicate a URI. A URI can worsen asthma and chronic obstructive pulmonary disease (COPD). Sometimes, these complications can require emergency medical care and may be life threatening.  CAUSES Almost all URIs are caused by viruses. A virus is a type of germ and can spread from one person to another.  RISKS FACTORS You may be at risk for a URI if:   You smoke.   You have chronic heart or lung disease.  You have a weakened defense (immune) system.   You are very young or very old.   You have nasal allergies or asthma.  You work in crowded or poorly ventilated areas.  You work in health care facilities or schools. SIGNS AND SYMPTOMS  Symptoms typically develop 2-3 days after you come in contact with a cold virus. Most viral URIs last 7-10 days. However, viral URIs from the influenza virus (flu  virus) can last 14-18 days and are typically more severe. Symptoms may include:   Runny or stuffy (congested) nose.   Sneezing.   Cough.   Sore throat.   Headache.   Fatigue.   Fever.   Loss of appetite.   Pain in your forehead, behind your eyes, and over your cheekbones (sinus pain).  Muscle aches.  DIAGNOSIS  Your health care provider may diagnose a URI by:  Physical exam.  Tests to check that your symptoms are not due to another condition such as:  Strep throat.  Sinusitis.  Pneumonia.  Asthma. TREATMENT  A URI goes away on its own with time. It cannot be cured with medicines, but medicines may be prescribed or recommended to relieve symptoms. Medicines may help:  Reduce your fever.  Reduce your cough.  Relieve nasal congestion. HOME CARE INSTRUCTIONS   Take medicines only as directed by your health care provider.   Gargle warm saltwater or take cough drops to comfort your throat as directed by your health care provider.  Use a warm mist humidifier or inhale steam from a shower to increase air moisture. This may make it easier to breathe.  Drink enough fluid to keep your urine clear or pale yellow.   Eat soups and other clear broths and maintain good nutrition.   Rest as needed.   Return to work when your temperature has returned to normal or as your health care provider advises. You may need to stay home longer to avoid infecting others. You can also use  a face mask and careful hand washing to prevent spread of the virus.  Increase the usage of your inhaler if you have asthma.   Do not use any tobacco products, including cigarettes, chewing tobacco, or electronic cigarettes. If you need help quitting, ask your health care provider. PREVENTION  The best way to protect yourself from getting a cold is to practice good hygiene.   Avoid oral or hand contact with people with cold symptoms.   Wash your hands often if contact occurs.   There is no clear evidence that vitamin C, vitamin E, echinacea, or exercise reduces the chance of developing a cold. However, it is always recommended to get plenty of rest, exercise, and practice good nutrition.  SEEK MEDICAL CARE IF:   You are getting worse rather than better.   Your symptoms are not controlled by medicine.   You have chills.  You have worsening shortness of breath.  You have brown or red mucus.  You have yellow or brown nasal discharge.  You have pain in your face, especially when you bend forward.  You have a fever.  You have swollen neck glands.  You have pain while swallowing.  You have white areas in the back of your throat. SEEK IMMEDIATE MEDICAL CARE IF:   You have severe or persistent:  Headache.  Ear pain.  Sinus pain.  Chest pain.  You have chronic lung disease and any of the following:  Wheezing.  Prolonged cough.  Coughing up blood.  A change in your usual mucus.  You have a stiff neck.  You have changes in your:  Vision.  Hearing.  Thinking.  Mood. MAKE SURE YOU:   Understand these instructions.  Will watch your condition.  Will get help right away if you are not doing well or get worse.   This information is not intended to replace advice given to you by your health care provider. Make sure you discuss any questions you have with your health care provider.   Document Released: 01/31/2001 Document Revised: 12/22/2014 Document Reviewed: 11/12/2013 Elsevier Interactive Patient Education [employer]

## 2015-07-05 NOTE — ED Provider Notes (Signed)
CSN: [id number]     Arrival date & time 07/05/15  [id number] History   First MD Initiated Contact with Patient 07/05/15 [phone]     Chief Complaint  Patient presents with  . Hypertension    HPI   Gina Richards is a 54 y.o. female with a PMH of HTN, depression, anxiety who presents to the ED with hypertension. She states she was seen by her PCP on Friday, and was told to come to the ED at that time for elevated BP. She reports intermittent dizziness, headache, chest pain, and shortness of breath since that time. She denies exacerbating or alleviating factors. She denies fever, chills. She reports associated blurry vision. She denies abdominal pain, nausea, vomiting, diarrhea, constipation, numbness, weakness. She states she has not taken her blood pressure medication 2 days due to running out.   Past Medical History  Diagnosis Date  . Hypertension   . Anxiety   . Depression    History reviewed. No pertinent past surgical history. History reviewed. No pertinent family history. Social History  Substance Use Topics  . Smoking status: Current Every Day Smoker -- 0.25 packs/day    Types: Cigarettes  . Smokeless tobacco: None  . Alcohol Use: Yes   OB History    No data available       Review of Systems  Constitutional: Negative for fever and chills.  HENT: Negative for congestion.   Eyes: Positive for visual disturbance.  Respiratory: Positive for shortness of breath. Negative for cough.   Cardiovascular: Positive for chest pain.  Gastrointestinal: Negative for nausea, vomiting, abdominal pain, diarrhea and constipation.  Neurological: Positive for dizziness and headaches. Negative for weakness, light-headedness and numbness.  All other systems reviewed and are negative.     Allergies  Review of patient's allergies indicates no known allergies.  Home Medications   Prior to Admission medications   Medication Sig Start Date End Date Taking? Authorizing Provider  diclofenac  (VOLTAREN) 75 MG EC tablet Take 75 mg by mouth 2 (two) times daily as needed. pain 05/14/15  Yes Historical Provider, MD  benzonatate (TESSALON) 100 MG capsule Take 1 capsule (100 mg total) by mouth every 8 (eight) hours. 06/15/15   Everlene FarrierWilliam Dansie, PA-C  cetirizine (ZYRTEC ALLERGY) 10 MG tablet Take 1 tablet (10 mg total) by mouth daily. 06/15/15   Everlene FarrierWilliam Dansie, PA-C  clotrimazole (LOTRIMIN) 1 % cream Apply to affected area 2 times daily 06/15/15   Everlene FarrierWilliam Dansie, PA-C  fluticasone [facility](FLONASE) 50 MCG/ACT nasal spray Place 2 sprays into both nostrils daily. 06/15/15   Everlene FarrierWilliam Dansie, PA-C  lisinopril (PRINIVIL,ZESTRIL) 5 MG tablet Take 1 tablet (5 mg total) by mouth daily. 07/05/15   Mady GemmaElizabeth C Esiah Bazinet, PA-C    BP 139/99 mmHg  Pulse 64  Temp(Src) 98.4 F (36.9 C) (Oral)  Resp 14  Ht 5' 2.5" (1.588 m)  Wt 145 lb (65.772 kg)  BMI 26.08 kg/m2  SpO2 98% Physical Exam  Constitutional: She is oriented to person, place, and time. She appears well-developed and well-nourished. No distress.  HENT:  Head: Normocephalic and atraumatic.  Right Ear: External ear normal.  Left Ear: External ear normal.  Nose: Nose normal.  Mouth/Throat: Uvula is midline, oropharynx is clear and moist and mucous membranes are normal.  Eyes: Conjunctivae, EOM and lids are normal. Pupils are equal, round, and reactive to light. Right eye exhibits no discharge. Left eye exhibits no discharge. No scleral icterus.  Neck: Normal range of motion. Neck supple.  Cardiovascular: Normal rate,  regular rhythm, normal heart sounds, intact distal pulses and normal pulses.   Pulmonary/Chest: Effort normal and breath sounds normal. No respiratory distress. She has no wheezes. She has no rales.  Abdominal: Soft. Normal appearance and bowel sounds are normal. She exhibits no distension and no mass. There is no tenderness. There is no rigidity, no rebound and no guarding.  Musculoskeletal: Normal range of motion. She exhibits no edema or  tenderness.  Neurological: She is alert and oriented to person, place, and time. She has normal strength. No cranial nerve deficit or sensory deficit.  Skin: Skin is warm, dry and intact. No rash noted. She is not diaphoretic. No erythema. No pallor.  Psychiatric: She has a normal mood and affect. Her speech is normal and behavior is normal.  Nursing note and vitals reviewed.   ED Course  Procedures (including critical care time)  Labs Review Labs Reviewed  BASIC METABOLIC PANEL  CBC  I-STAT TROPOININ, ED  Rosezena Sensor, ED    Imaging Review Dg Chest 2 View  07/05/2015  CLINICAL DATA:  Cough and chest congestion for 3 days.  Chest pain. EXAM: CHEST  2 VIEW COMPARISON:  06/15/2015 FINDINGS: There is chronic cardiomegaly. Pulmonary vascularity is normal and the lungs are clear except for peribronchial thickening. No effusions. No acute osseous abnormality. IMPRESSION: Bronchitic changes. Electronically Signed   By: Francene Boyers M.D.   On: 07/05/2015 11:32     I have personally reviewed and evaluated these images and lab results as part of my medical decision-making.   EKG Interpretation   Date/Time:  Monday July 05 2015 10:28:04 EST Ventricular Rate:  54 PR Interval:  174 QRS Duration: 158 QT Interval:  503 QTC Calculation: 477 R Axis:   -97 Text Interpretation:  Sinus rhythm RBBB and LAFB Confirmed by Lincoln Brigham  [phone]) on 07/05/2015 10:38:16 AM      MDM   Final diagnoses:  Bronchitis  Elevated BP    54 year old female presents with hypertension, and reports associated intermittent dizziness, headache, blurry vision, chest pain, and shortness of breath, which started on Friday. Denies fever, chills, abdominal pain, nausea, vomiting, diarrhea, constipation, numbness, weakness. States she has not taken her blood pressure medication for 2 days due to running out.  Patient is afebrile. Initially hypertensive to 140 systolic, 110s diastolic. Heart regular rate and  rhythm. Lungs clear to auscultation bilaterally. Abdomen soft, nontender, nondistended. Patient moves all extremities without difficulty. Normal neuro exam with no focal deficit. Strength and sensation intact.  EKG sinus rhythm RBBB and LAFB. Troponin negative.  CBC negative for leukocytosis or anemia. BMP within normal limits. Chest x-ray remarkable for bronchitic changes.  Heart score 2 given age and risk factors (HTN and tobacco use).  Will obtained delta troponin.  Delta troponin negative. Patient discussed with and seen by Dr. Madilyn Hook. Patient is well-appearing, BP improved to 130s/90s. Feel she is stable for discharge at this time. Will refill lisinopril. Advised she can take over-the-counter robitussin for bronchitis. Return precautions discussed. Patient to follow up with PCP. Patient verbalizes her understanding and is in agreement with plan.  BP 139/99 mmHg  Pulse 64  Temp(Src) 98.4 F (36.9 C) (Oral)  Resp 14  Ht 5' 2.5" (1.588 m)  Wt 145 lb (65.772 kg)  BMI 26.08 kg/m2  SpO2 98%    Mady Gemma, PA-C 07/05/15 1455  Tilden Fossa, MD 07/09/15 [id number]

## 2015-07-05 NOTE — ED Notes (Signed)
[facility] PT[facility] had [facility]N and should come to the ED. Pt reports "not feeling herself" the last couple days and reports dizziness, HA, and back pain.

## 2016-01-10 ENCOUNTER — Encounter (HOSPITAL_COMMUNITY): Payer: Self-pay | Admitting: Emergency Medicine

## 2016-01-10 ENCOUNTER — Emergency Department (HOSPITAL_COMMUNITY)
Admission: EM | Admit: 2016-01-10 | Discharge: 2016-01-10 | Disposition: A | Discharge status: Home / Self Care | Payer: Medicaid Other | Attending: Emergency Medicine | Admitting: Emergency Medicine

## 2016-01-10 DIAGNOSIS — F17210 Nicotine dependence, cigarettes, uncomplicated: Secondary | ICD-10-CM | POA: Diagnosis not present

## 2016-01-10 DIAGNOSIS — R229 Localized swelling, mass and lump, unspecified: Secondary | ICD-10-CM

## 2016-01-10 DIAGNOSIS — I10 Essential (primary) hypertension: Secondary | ICD-10-CM | POA: Insufficient documentation

## 2016-01-10 DIAGNOSIS — Z8659 Personal history of other mental and behavioral disorders: Secondary | ICD-10-CM | POA: Diagnosis not present

## 2016-01-10 DIAGNOSIS — R222 Localized swelling, mass and lump, trunk: Secondary | ICD-10-CM | POA: Insufficient documentation

## 2016-01-10 DIAGNOSIS — Z79899 Other long term (current) drug therapy: Secondary | ICD-10-CM | POA: Insufficient documentation

## 2016-01-10 DIAGNOSIS — IMO0002 Reserved for concepts with insufficient information to code with codable children: Secondary | ICD-10-CM

## 2016-01-10 DIAGNOSIS — Z7952 Long term (current) use of systemic steroids: Secondary | ICD-10-CM | POA: Diagnosis not present

## 2016-01-10 NOTE — ED Notes (Signed)
Pt has a knot on her mid back which has been present for 5 years. Pt alert x4 NAD at this time.

## 2016-01-10 NOTE — ED Notes (Signed)
Declined W/C at D/C and was escorted to lobby by RN. 

## 2016-01-10 NOTE — Discharge Instructions (Signed)
You will need to follow up with a PCP for further evaluation of this mass. Call and schedule an appointment.

## 2016-01-10 NOTE — ED Notes (Signed)
Pt called x 3 . Unable to located Pt in front lobby

## 2016-01-10 NOTE — ED Provider Notes (Signed)
CSN: [id number]     Arrival date & time 01/10/16  1252 History  By signing my name below, I, Gina Richards, attest that this documentation has been prepared under the direction and in the presence of Gina HeimlichStevi Avie Checo, PA-C Electronically Signed: Bethel BornBritney Richards, ED Scribe. 01/10/2016 1:50 PM    Chief Complaint  Patient presents with  . Cyst   The history is provided by the patient. No language interpreter was used.   Gina Richards is a 55 y.o. female who presents to the Emergency Department complaining of a "knot" at the mid back with onset at least 5 years ago. The area is occasionally painful and has not increased in size over time. She states that it is soft and becomes irritated occasionally because it is at her bra line. Denies drainage from the area. She states that it has been bothering her for quite some time but she has procrastinated on going to the doctor. Denies any changes in the area today; states she "just figured I should finally get it looked at". She does not have a PCP but used to see Gina Richards on [address]. She has not seen him in past 5 years. She has no other complaints today.   Past Medical History  Diagnosis Date  . Hypertension   . Anxiety   . Depression    History reviewed. No pertinent past surgical history. No family history on file. Social History  Substance Use Topics  . Smoking status: Current Every Day Smoker -- 0.25 packs/day    Types: Cigarettes  . Smokeless tobacco: None  . Alcohol Use: Yes   OB History    No data available     Review of Systems  Skin:       Area of swelling at mid back   All other systems reviewed and are negative.  Allergies  Review of patient's allergies indicates no known allergies.  Home Medications   Prior to Admission medications   Medication Sig Start Date End Date Taking? Authorizing Provider  benzonatate (TESSALON) 100 MG capsule Take 1 capsule (100 mg total) by mouth every 8 (eight) hours. 06/15/15    Gina FarrierWilliam Dansie, PA-C  cetirizine (ZYRTEC ALLERGY) 10 MG tablet Take 1 tablet (10 mg total) by mouth daily. 06/15/15   Gina FarrierWilliam Dansie, PA-C  clotrimazole (LOTRIMIN) 1 % cream Apply to affected area 2 times daily 06/15/15   Gina FarrierWilliam Dansie, PA-C  diclofenac (VOLTAREN) 75 MG EC tablet Take 75 mg by mouth 2 (two) times daily as needed. pain 05/14/15   Historical Provider, MD  fluticasone (FLONASE) 50 MCG/ACT nasal spray Place 2 sprays into both nostrils daily. 06/15/15   Gina FarrierWilliam Dansie, PA-C  lisinopril (PRINIVIL,ZESTRIL) 5 MG tablet Take 1 tablet (5 mg total) by mouth daily. 07/05/15   Gina C Westfall, PA-C   BP 152/96 mmHg  Temp(Src) 98.9 F (37.2 C)  Resp 18  SpO2 96% Physical Exam  Constitutional: She appears well-developed and well-nourished. No distress.  Nontoxic appearing  HENT:  Head: Normocephalic and atraumatic.  Right Ear: External ear normal.  Left Ear: External ear normal.  Eyes: Conjunctivae are normal. Right eye exhibits no discharge. Left eye exhibits no discharge. No scleral icterus.  Neck: Normal range of motion.  Cardiovascular: Normal rate.   Pulmonary/Chest: Effort normal.  Musculoskeletal: Normal range of motion.  Moves all extremities spontaneously  Neurological: She is alert. Coordination normal.  Skin: Skin is warm and dry.  Large, mobile, rubbery mass noted to right posterior mid-thoracic region. Size  of approximately 6 cm. No tenderness to palpation. No fluctuance, induration, erythema noted. No dimpling of the skin. No warmth.   Psychiatric: She has a normal mood and affect. Her behavior is normal.  Nursing note and vitals reviewed.   ED Course  Procedures (including critical care time) DIAGNOSTIC STUDIES: Oxygen Saturation is 96% on RA,  normal by my interpretation.    COORDINATION OF CARE: 1:49 PM Discussed treatment plan which includes discharge to establish primary care with pt at bedside and pt agreed to plan.  Labs Review Labs Reviewed - No  data to display  Imaging Review No results found.   EKG Interpretation None      MDM   Final diagnoses:  Mass   55 year old female presenting with a "knot" to her mid back 5 years. No acute changes today. Afebrile and nontoxic appearing. Large, rubbery mass noted to posterior thoracic back. No fluctuance or induration. 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 Given the length of time mass has been present and no acute changes today, patient is appropriate for outpatient workup. Discussed with patient that she will need to follow-up with her PCP and may need a surgical consult eventually for removal. Patient states understanding. Return precautions given in discharge paperwork and discussed with pt at bedside. Pt stable for discharge  I personally performed the services described in this documentation, which was scribed in my presence. The recorded information has been reviewed and is accurate.    Gina Gala Kynnedy Carreno, PA-C 01/10/16 1403  Gina Berkshire, MD 01/10/16 [phone]

## 2016-01-26 DIAGNOSIS — F1920 Other psychoactive substance dependence, uncomplicated: Secondary | ICD-10-CM | POA: Diagnosis not present

## 2016-01-26 DIAGNOSIS — Z5181 Encounter for therapeutic drug level monitoring: Secondary | ICD-10-CM | POA: Diagnosis not present

## 2016-01-31 DIAGNOSIS — Z5181 Encounter for therapeutic drug level monitoring: Secondary | ICD-10-CM | POA: Diagnosis not present

## 2016-01-31 DIAGNOSIS — F1920 Other psychoactive substance dependence, uncomplicated: Secondary | ICD-10-CM | POA: Diagnosis not present

## 2016-02-10 DIAGNOSIS — Z139 Encounter for screening, unspecified: Secondary | ICD-10-CM | POA: Diagnosis not present

## 2016-03-02 DIAGNOSIS — Z139 Encounter for screening, unspecified: Secondary | ICD-10-CM | POA: Diagnosis not present

## 2016-03-16 DIAGNOSIS — Z5181 Encounter for therapeutic drug level monitoring: Secondary | ICD-10-CM | POA: Diagnosis not present

## 2016-03-16 DIAGNOSIS — F1920 Other psychoactive substance dependence, uncomplicated: Secondary | ICD-10-CM | POA: Diagnosis not present

## 2016-03-30 DIAGNOSIS — F1920 Other psychoactive substance dependence, uncomplicated: Secondary | ICD-10-CM | POA: Diagnosis not present

## 2016-03-30 DIAGNOSIS — Z5181 Encounter for therapeutic drug level monitoring: Secondary | ICD-10-CM | POA: Diagnosis not present

## 2016-04-11 DIAGNOSIS — J45909 Unspecified asthma, uncomplicated: Secondary | ICD-10-CM | POA: Diagnosis not present

## 2016-04-11 DIAGNOSIS — M545 Low back pain: Secondary | ICD-10-CM | POA: Diagnosis not present

## 2016-04-11 DIAGNOSIS — I10 Essential (primary) hypertension: Secondary | ICD-10-CM | POA: Diagnosis not present

## 2016-08-23 ENCOUNTER — Encounter (HOSPITAL_COMMUNITY): Payer: Self-pay | Admitting: Emergency Medicine

## 2016-08-23 DIAGNOSIS — J069 Acute upper respiratory infection, unspecified: Secondary | ICD-10-CM | POA: Insufficient documentation

## 2016-08-23 DIAGNOSIS — F17210 Nicotine dependence, cigarettes, uncomplicated: Secondary | ICD-10-CM | POA: Diagnosis not present

## 2016-08-23 DIAGNOSIS — Z79899 Other long term (current) drug therapy: Secondary | ICD-10-CM | POA: Diagnosis not present

## 2016-08-23 DIAGNOSIS — I10 Essential (primary) hypertension: Secondary | ICD-10-CM | POA: Diagnosis not present

## 2016-08-23 DIAGNOSIS — R112 Nausea with vomiting, unspecified: Secondary | ICD-10-CM | POA: Diagnosis not present

## 2016-08-23 DIAGNOSIS — R05 Cough: Secondary | ICD-10-CM | POA: Diagnosis not present

## 2016-08-23 DIAGNOSIS — R030 Elevated blood-pressure reading, without diagnosis of hypertension: Secondary | ICD-10-CM | POA: Diagnosis not present

## 2016-08-23 NOTE — ED Triage Notes (Signed)
Pt presents to ED for assessment of a cough x 3 weeks with increasing discomfort this evening.  Pt c/o cough with clear phlegm.  EMS states rhonchi in lower lung fields.  Pt c/o n/v with coughing as well as headache.  Pt denies any home remedies.  100% on RA.

## 2016-08-23 NOTE — ED Notes (Signed)
Patient began to complain of chest pain, nausea, and light-headedness in triage.  States she feels like she is going to pass out.

## 2016-08-24 ENCOUNTER — Emergency Department (HOSPITAL_COMMUNITY)
Admission: EM | Admit: 2016-08-24 | Discharge: 2016-08-24 | Disposition: A | Discharge status: Home / Self Care | Payer: Medicare Other | Attending: Emergency Medicine | Admitting: Emergency Medicine

## 2016-08-24 ENCOUNTER — Emergency Department (HOSPITAL_COMMUNITY): Payer: Medicare Other

## 2016-08-24 DIAGNOSIS — J069 Acute upper respiratory infection, unspecified: Secondary | ICD-10-CM

## 2016-08-24 DIAGNOSIS — R05 Cough: Secondary | ICD-10-CM | POA: Diagnosis not present

## 2016-08-24 LAB — BASIC METABOLIC PANEL
Anion gap: 12 (ref 5–15)
BUN: 7 mg/dL (ref 6–20)
CO2: 24 mmol/L (ref 22–32)
CREATININE: 0.68 mg/dL (ref 0.44–1.00)
Calcium: 9.1 mg/dL (ref 8.9–10.3)
Chloride: 102 mmol/L (ref 101–111)
Glucose, Bld: 96 mg/dL (ref 65–99)
POTASSIUM: 2.9 mmol/L — AB (ref 3.5–5.1)
SODIUM: 138 mmol/L (ref 135–145)

## 2016-08-24 LAB — CBC
HCT: 40.9 % (ref 36.0–46.0)
Hemoglobin: 13.9 g/dL (ref 12.0–15.0)
MCH: 32.1 pg (ref 26.0–34.0)
MCHC: 34 g/dL (ref 30.0–36.0)
MCV: 94.5 fL (ref 78.0–100.0)
PLATELETS: 232 10*3/uL (ref 150–400)
RBC: 4.33 MIL/uL (ref 3.87–5.11)
RDW: 13.8 % (ref 11.5–15.5)
WBC: 6 10*3/uL (ref 4.0–10.5)

## 2016-08-24 LAB — ETHANOL: Alcohol, Ethyl (B): 11 mg/dL — ABNORMAL HIGH (ref <5)

## 2016-08-24 LAB — I-STAT TROPONIN, ED: Troponin i, poc: 0 ng/mL (ref 0.00–0.08)

## 2016-08-24 MED ORDER — BENZONATATE 100 MG PO CAPS: 100.0000 mg | ORAL_CAPSULE | Freq: Three times a day (TID) | ORAL | 0 refills | Status: DC

## 2016-08-24 MED ORDER — POTASSIUM CHLORIDE CRYS ER 20 MEQ PO TBCR
40.0000 meq | EXTENDED_RELEASE_TABLET | Freq: Once | ORAL | Status: AC
  Administered 2016-08-24: 40 meq via ORAL
  Filled 2016-08-24: qty 2

## 2016-08-24 NOTE — ED Provider Notes (Signed)
[facility]-EMERGENCY DEPT Provider Note   CSN: [id number] Arrival date & time: 08/23/16  2340     History   Chief Complaint Chief Complaint  Patient presents with  . URI    HPI Gina Richards is a 56 y.o. female.  Patient presents with complaint of "I'm sick and I don't feel well." She reports symptoms of body aches, cough that is productive, chest pain with cough, nausea and vomiting that is not associated with cough. No sick family members. She does not feel she has had a fever. She is a smoker and continues active smoking during illness. No urinary symptoms, diarrhea or significant nasal congestion, sore throat or sinus pressure. She has a history asthma/COPD with inhaler use at home but denies using her inhaler for current symptoms.   The history is provided by the patient. No language interpreter was used.    Past Medical History:  Diagnosis Date  . Anxiety   . Depression   . Hypertension     There are no active problems to display for this patient.   History reviewed. No pertinent surgical history.  OB History    No data available       Home Medications    Prior to Admission medications   Medication Sig Start Date End Date Taking? Authorizing Provider  benzonatate (TESSALON) 100 MG capsule Take 1 capsule (100 mg total) by mouth every 8 (eight) hours. 06/15/15   Everlene Farrier, PA-C  cetirizine (ZYRTEC ALLERGY) 10 MG tablet Take 1 tablet (10 mg total) by mouth daily. 06/15/15   Everlene Farrier, PA-C  clotrimazole (LOTRIMIN) 1 % cream Apply to affected area 2 times daily 06/15/15   Everlene Farrier, PA-C  diclofenac (VOLTAREN) 75 MG EC tablet Take 75 mg by mouth 2 (two) times daily as needed. pain 05/14/15   Historical Provider, MD  fluticasone (FLONASE) 50 MCG/ACT nasal spray Place 2 sprays into both nostrils daily. 06/15/15   Everlene Farrier, PA-C  lisinopril (PRINIVIL,ZESTRIL) 5 MG tablet Take 1 tablet (5 mg total) by mouth daily. 07/05/15   Mady Gemma, PA-C     Family History History reviewed. No pertinent family history.  Social History Social History  Substance Use Topics  . Smoking status: Current Every Day Smoker    Packs/day: 0.25    Types: Cigarettes  . Smokeless tobacco: Never Used  . Alcohol use Yes     Allergies   Patient has no known allergies.   Review of Systems Review of Systems  Constitutional: Negative for chills and fever.  HENT: Negative.  Negative for congestion, sinus pain and sore throat.   Respiratory: Positive for cough.   Cardiovascular: Positive for chest pain.  Gastrointestinal: Positive for nausea and vomiting. Negative for abdominal pain.  Genitourinary: Negative.   Musculoskeletal: Positive for myalgias.  Skin: Negative.  Negative for rash.  Neurological: Negative.  Negative for weakness.     Physical Exam Updated Vital Signs BP (!) 179/109 (BP Location: Right Arm)   Pulse 81   Temp 99.8 F (37.7 C) (Oral)   Resp 18   Ht 5\' 2"  (1.575 m)   Wt 67.1 kg   SpO2 98%   BMI 27.07 kg/m   Physical Exam  Constitutional: She is oriented to person, place, and time. She appears well-developed and well-nourished.  HENT:  Nose: Nose normal.  Eyes: Conjunctivae are normal.  Neck: Normal range of motion. Neck supple.  Cardiovascular: Normal rate.   Pulmonary/Chest: Effort normal. No respiratory distress.  No tachypnea.  Neurological: She is alert and oriented to person, place, and time.  Skin: Skin is warm and dry.     ED Treatments / Results  Labs (all labs ordered are listed, but only abnormal results are displayed) Labs Reviewed  BASIC METABOLIC PANEL - Abnormal; Notable for the following:       Result Value   Potassium 2.9 (*)    All other components within normal limits  ETHANOL - Abnormal; Notable for the following:    Alcohol, Ethyl (B) 11 (*)    All other components within normal limits  CBC  I-STAT TROPOININ, ED    EKG  EKG Interpretation  Date/Time:  Wednesday August 23 2016 23:53:03 EST Ventricular Rate:  87 PR Interval:  160 QRS Duration: 156 QT Interval:  486 QTC Calculation: 584 R Axis:   -105 Text Interpretation:  Normal sinus rhythm Right atrial enlargement Right bundle branch block Abnormal ECG Interpretation limited secondary to artifact Confirmed by Bebe ShaggyWICKLINE  MD, DONALD ([id number]) on 08/24/2016 1:52:39 AM       Radiology Dg Chest 2 View  Result Date: 08/24/2016 CLINICAL DATA:  Cough for 3 weeks EXAM: CHEST  2 VIEW COMPARISON:  07/05/2015 FINDINGS: No consolidation or effusion. Stable cardiomegaly without overt failure. No pneumothorax. IMPRESSION: No acute infiltrate Electronically Signed   By: Jasmine PangKim  Fujinaga M.D.   On: 08/24/2016 00:35    Procedures Procedures (including critical care time)  Medications Ordered in ED Medications  potassium chloride SA (K-DUR,KLOR-CON) CR tablet 40 mEq (40 mEq Oral Given 08/24/16 0205)     Initial Impression / Assessment and Plan / ED Course  I have reviewed the triage vital signs and the nursing notes.  Pertinent labs & imaging results that were available during my care of the patient were reviewed by me and considered in my medical decision making (see chart for details).  Clinical Course     Patient presents with URI symptoms of cough, general malaise, without fever. On exam, she appears non-toxic. VS have been stable, no currently tachypneic. CXR clear, labs reassuring.   She can be discharged home with supportive care and is encouraged to see her doctor for recheck if no better in a week.   Final Clinical Impressions(s) / ED Diagnoses   Final diagnoses:  None   1. URI, viral  New Prescriptions New Prescriptions   No medications on file     Elpidio AnisShari Deontaye Civello, PA-C 08/24/16 0425    Zadie Rhineonald Wickline, MD 08/25/16 [phone]

## 2016-08-24 NOTE — Discharge Instructions (Signed)
Your chest x-ray and lab studies are all normal, indicating that you have a viral illness. It would help if you stopped smoking. Get plenty of rest, drink lots of fluids - juice, water. Follow up with Dr. Concepcion ElkAvbuere if you are no better in 1 week.

## 2016-08-24 NOTE — ED Notes (Signed)
Pt admitted to ETOH on board with Delila Pereyrayrone, Phlebotomist.  Pt snapped at [employer] and Phlebotomist during assessment/triage.

## 2016-08-24 NOTE — ED Notes (Signed)
ED Provider at bedside. 

## 2017-01-19 DIAGNOSIS — Z720 Tobacco use: Secondary | ICD-10-CM | POA: Diagnosis not present

## 2017-01-19 DIAGNOSIS — Z0289 Encounter for other administrative examinations: Secondary | ICD-10-CM | POA: Diagnosis not present

## 2017-02-14 DIAGNOSIS — M79672 Pain in left foot: Secondary | ICD-10-CM | POA: Diagnosis not present

## 2017-02-14 DIAGNOSIS — I10 Essential (primary) hypertension: Secondary | ICD-10-CM | POA: Diagnosis not present

## 2017-02-14 DIAGNOSIS — Z9114 Patient's other noncompliance with medication regimen: Secondary | ICD-10-CM | POA: Diagnosis not present

## 2017-02-15 DIAGNOSIS — Z79899 Other long term (current) drug therapy: Secondary | ICD-10-CM | POA: Diagnosis not present

## 2017-02-15 DIAGNOSIS — E784 Other hyperlipidemia: Secondary | ICD-10-CM | POA: Diagnosis not present

## 2017-03-05 DIAGNOSIS — J45909 Unspecified asthma, uncomplicated: Secondary | ICD-10-CM | POA: Diagnosis not present

## 2017-03-05 DIAGNOSIS — I10 Essential (primary) hypertension: Secondary | ICD-10-CM | POA: Diagnosis not present

## 2017-03-05 DIAGNOSIS — Z79899 Other long term (current) drug therapy: Secondary | ICD-10-CM | POA: Diagnosis not present

## 2017-03-05 DIAGNOSIS — G4700 Insomnia, unspecified: Secondary | ICD-10-CM | POA: Diagnosis not present

## 2017-03-14 ENCOUNTER — Emergency Department (HOSPITAL_COMMUNITY): Payer: Medicare Other

## 2017-03-14 ENCOUNTER — Encounter (HOSPITAL_COMMUNITY): Payer: Self-pay | Admitting: *Deleted

## 2017-03-14 ENCOUNTER — Emergency Department (HOSPITAL_COMMUNITY)
Admission: EM | Admit: 2017-03-14 | Discharge: 2017-03-14 | Disposition: A | Discharge status: Home / Self Care | Payer: Medicare Other | Attending: Emergency Medicine | Admitting: Emergency Medicine

## 2017-03-14 DIAGNOSIS — M25512 Pain in left shoulder: Secondary | ICD-10-CM | POA: Diagnosis not present

## 2017-03-14 DIAGNOSIS — M25551 Pain in right hip: Secondary | ICD-10-CM | POA: Insufficient documentation

## 2017-03-14 DIAGNOSIS — Y9389 Activity, other specified: Secondary | ICD-10-CM | POA: Diagnosis not present

## 2017-03-14 DIAGNOSIS — S4992XA Unspecified injury of left shoulder and upper arm, initial encounter: Secondary | ICD-10-CM | POA: Insufficient documentation

## 2017-03-14 DIAGNOSIS — M546 Pain in thoracic spine: Secondary | ICD-10-CM | POA: Diagnosis not present

## 2017-03-14 DIAGNOSIS — S99922A Unspecified injury of left foot, initial encounter: Secondary | ICD-10-CM | POA: Diagnosis not present

## 2017-03-14 DIAGNOSIS — M79662 Pain in left lower leg: Secondary | ICD-10-CM | POA: Diagnosis not present

## 2017-03-14 DIAGNOSIS — M542 Cervicalgia: Secondary | ICD-10-CM | POA: Insufficient documentation

## 2017-03-14 DIAGNOSIS — S069X0A Unspecified intracranial injury without loss of consciousness, initial encounter: Secondary | ICD-10-CM | POA: Diagnosis not present

## 2017-03-14 DIAGNOSIS — M25511 Pain in right shoulder: Secondary | ICD-10-CM | POA: Diagnosis not present

## 2017-03-14 DIAGNOSIS — Z79899 Other long term (current) drug therapy: Secondary | ICD-10-CM | POA: Insufficient documentation

## 2017-03-14 DIAGNOSIS — F17210 Nicotine dependence, cigarettes, uncomplicated: Secondary | ICD-10-CM | POA: Insufficient documentation

## 2017-03-14 DIAGNOSIS — S79911A Unspecified injury of right hip, initial encounter: Secondary | ICD-10-CM | POA: Diagnosis not present

## 2017-03-14 DIAGNOSIS — I10 Essential (primary) hypertension: Secondary | ICD-10-CM | POA: Diagnosis not present

## 2017-03-14 DIAGNOSIS — S199XXA Unspecified injury of neck, initial encounter: Secondary | ICD-10-CM | POA: Diagnosis not present

## 2017-03-14 DIAGNOSIS — Y999 Unspecified external cause status: Secondary | ICD-10-CM | POA: Insufficient documentation

## 2017-03-14 DIAGNOSIS — S4991XA Unspecified injury of right shoulder and upper arm, initial encounter: Secondary | ICD-10-CM | POA: Insufficient documentation

## 2017-03-14 DIAGNOSIS — Y92410 Unspecified street and highway as the place of occurrence of the external cause: Secondary | ICD-10-CM | POA: Insufficient documentation

## 2017-03-14 DIAGNOSIS — S0090XA Unspecified superficial injury of unspecified part of head, initial encounter: Secondary | ICD-10-CM | POA: Diagnosis not present

## 2017-03-14 DIAGNOSIS — T148XXA Other injury of unspecified body region, initial encounter: Secondary | ICD-10-CM | POA: Diagnosis not present

## 2017-03-14 DIAGNOSIS — S8992XA Unspecified injury of left lower leg, initial encounter: Secondary | ICD-10-CM | POA: Diagnosis not present

## 2017-03-14 MED ORDER — ACETAMINOPHEN 500 MG PO TABS: 500.0000 mg | ORAL_TABLET | Freq: Four times a day (QID) | ORAL | 0 refills | Status: DC | PRN

## 2017-03-14 MED ORDER — CYCLOBENZAPRINE HCL 5 MG PO TABS: 5.0000 mg | ORAL_TABLET | Freq: Two times a day (BID) | ORAL | 0 refills | Status: DC | PRN

## 2017-03-14 MED ORDER — MORPHINE SULFATE (PF) 4 MG/ML IV SOLN
4.0000 mg | Freq: Once | INTRAVENOUS | Status: AC
  Administered 2017-03-14: 4 mg via INTRAMUSCULAR
  Filled 2017-03-14: qty 1
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
## 2017-05-23 ENCOUNTER — Ambulatory Visit (HOSPITAL_COMMUNITY)
Admission: EM | Admit: 2017-05-23 | Discharge: 2017-05-23 | Disposition: A | Discharge status: Home / Self Care | Payer: Medicare Other | Attending: Family Medicine | Admitting: Family Medicine

## 2017-05-23 ENCOUNTER — Encounter (HOSPITAL_COMMUNITY): Payer: Self-pay | Admitting: Emergency Medicine

## 2017-05-23 DIAGNOSIS — R05 Cough: Secondary | ICD-10-CM

## 2017-05-23 DIAGNOSIS — R059 Cough, unspecified: Secondary | ICD-10-CM

## 2017-05-23 MED ORDER — DOXYCYCLINE HYCLATE 100 MG PO CAPS: 100.0000 mg | ORAL_CAPSULE | Freq: Two times a day (BID) | ORAL | 0 refills | Status: DC

## 2017-05-23 MED ORDER — HYDROCODONE-HOMATROPINE 5-1.5 MG/5ML PO SYRP: 5.0000 mL | ORAL_SOLUTION | Freq: Four times a day (QID) | ORAL | 0 refills | Status: DC | PRN

## 2017-05-23 NOTE — ED Triage Notes (Signed)
The patient presented to the [facility] with a complaint of a cough and back and leg cramps x 2 weeks.

## 2017-05-26 NOTE — ED Provider Notes (Signed)
  [facility]   [id number] 05/23/17 Arrival Time: 1655  ASSESSMENT & PLAN:  1. Cough     Meds ordered this encounter  Medications  . HYDROcodone-homatropine (HYCODAN) 5-1.5 MG/5ML syrup    Sig: Take 5 mLs by mouth every 6 (six) hours as needed for cough.    Dispense:  60 mL    Refill:  0  . doxycycline (VIBRAMYCIN) 100 MG capsule    Sig: Take 1 capsule (100 mg total) by mouth 2 (two) times daily.    Dispense:  14 capsule    Refill:  0    OTC analgesics and symptom care as needed. [place] Controlled Substances Registry consulted for this patient. I feel the risk/benefit ratio today is favorable for proceeding with this prescription for a controlled substance. Medication sedation precautions given.  Reviewed expectations re: course of current medical issues. Questions answered. Outlined signs and symptoms indicating need for more acute intervention. Patient verbalized understanding. After Visit Summary given.   SUBJECTIVE:   Gina Richards is a 56 y.o. female who presents with complaint of nasal congestion, post-nasal drainage, and a persistent cough for the past 2 weeks. No SOB. No wheezing. Cough interfering with sleep. OTC medications without relief.  ROS: As per HPI.   OBJECTIVE:  Vitals:   05/23/17 1727  BP: (!) 147/86  Pulse: 62  Resp: 18  Temp: 98.7 F (37.1 C)  TempSrc: Oral  SpO2: 100%     General appearance: alert; no distress HEENT: nasal congestion; clear runny nose; throat irritation secondary to post-nasal drainage Neck: supple without LAD Lungs: clear to auscultation bilaterally Skin: warm and dry Psychological: alert and cooperative; normal mood and affect  No Known Allergies  Past Medical History:  Diagnosis Date  . Anxiety   . Depression   . Hypertension    Social History   Social History  . Marital status: Single    Spouse name: N/A  . Number of children: N/A  . Years of education: N/A   Occupational History  . Not on  file.   Social History Main Topics  . Smoking status: Current Every Day Smoker    Packs/day: 0.25    Types: Cigarettes  . Smokeless tobacco: Never Used  . Alcohol use Yes  . Drug use: No  . Sexual activity: Not on file   Other Topics Concern  . Not on file   Social History Narrative  . No narrative on file           Mardella Layman, MD 05/26/17 [phone]

## 2017-07-02 DIAGNOSIS — Z5181 Encounter for therapeutic drug level monitoring: Secondary | ICD-10-CM | POA: Diagnosis not present

## 2017-07-02 DIAGNOSIS — Z79899 Other long term (current) drug therapy: Secondary | ICD-10-CM | POA: Diagnosis not present

## 2017-07-04 DIAGNOSIS — Z5181 Encounter for therapeutic drug level monitoring: Secondary | ICD-10-CM | POA: Diagnosis not present

## 2017-07-04 DIAGNOSIS — Z79899 Other long term (current) drug therapy: Secondary | ICD-10-CM | POA: Diagnosis not present

## 2017-07-09 ENCOUNTER — Emergency Department (HOSPITAL_COMMUNITY)
Admission: EM | Admit: 2017-07-09 | Discharge: 2017-07-10 | Disposition: A | Discharge status: Home / Self Care | Payer: Medicare Other | Attending: Emergency Medicine | Admitting: Emergency Medicine

## 2017-07-09 ENCOUNTER — Encounter (HOSPITAL_COMMUNITY): Payer: Self-pay | Admitting: Family Medicine

## 2017-07-09 DIAGNOSIS — Z560 Unemployment, unspecified: Secondary | ICD-10-CM | POA: Diagnosis not present

## 2017-07-09 DIAGNOSIS — F1414 Cocaine abuse with cocaine-induced mood disorder: Secondary | ICD-10-CM | POA: Diagnosis not present

## 2017-07-09 DIAGNOSIS — F1910 Other psychoactive substance abuse, uncomplicated: Secondary | ICD-10-CM | POA: Insufficient documentation

## 2017-07-09 DIAGNOSIS — Z79899 Other long term (current) drug therapy: Secondary | ICD-10-CM | POA: Insufficient documentation

## 2017-07-09 DIAGNOSIS — F1220 Cannabis dependence, uncomplicated: Secondary | ICD-10-CM | POA: Diagnosis not present

## 2017-07-09 DIAGNOSIS — F329 Major depressive disorder, single episode, unspecified: Secondary | ICD-10-CM | POA: Diagnosis not present

## 2017-07-09 DIAGNOSIS — F17210 Nicotine dependence, cigarettes, uncomplicated: Secondary | ICD-10-CM | POA: Insufficient documentation

## 2017-07-09 DIAGNOSIS — I10 Essential (primary) hypertension: Secondary | ICD-10-CM | POA: Insufficient documentation

## 2017-07-09 DIAGNOSIS — Z599 Problem related to housing and economic circumstances, unspecified: Secondary | ICD-10-CM | POA: Diagnosis not present

## 2017-07-09 DIAGNOSIS — Z5181 Encounter for therapeutic drug level monitoring: Secondary | ICD-10-CM | POA: Diagnosis not present

## 2017-07-09 DIAGNOSIS — R45851 Suicidal ideations: Secondary | ICD-10-CM | POA: Diagnosis not present

## 2017-07-09 HISTORY — DX: Bipolar disorder, unspecified: F31.9

## 2017-07-09 NOTE — ED Triage Notes (Signed)
Patient is being escorted via [employer] [employer]. A call was made that patient was suicidal. On arrival by [employer], she was crying on the bed and initially refused to answer questions. Later, patient reported to [employer] she had got into an argument with a gentleman across the street. When she arrived back home from argument, she verbalized suicidal thoughts to her room mate. While [employer] was talking with the gentleman who was in the argument, she was hanging out of a window and acting like she was going to jump out of the window. She has a history of bi-polar and schizophrenia. Unsure of how compliment she is with taking medication. Patient has verbalized she was not going to jump out of the window.

## 2017-07-09 NOTE — ED Notes (Signed)
Bed: WTR6 Expected date:  Expected time:  Means of arrival:  Comments: 

## 2017-07-10 ENCOUNTER — Encounter (HOSPITAL_COMMUNITY): Payer: Self-pay | Admitting: Emergency Medicine

## 2017-07-10 DIAGNOSIS — F329 Major depressive disorder, single episode, unspecified: Secondary | ICD-10-CM | POA: Diagnosis not present

## 2017-07-10 DIAGNOSIS — F1220 Cannabis dependence, uncomplicated: Secondary | ICD-10-CM | POA: Diagnosis not present

## 2017-07-10 DIAGNOSIS — Z560 Unemployment, unspecified: Secondary | ICD-10-CM

## 2017-07-10 DIAGNOSIS — R4587 Impulsiveness: Secondary | ICD-10-CM

## 2017-07-10 DIAGNOSIS — Z599 Problem related to housing and economic circumstances, unspecified: Secondary | ICD-10-CM

## 2017-07-10 DIAGNOSIS — F1414 Cocaine abuse with cocaine-induced mood disorder: Secondary | ICD-10-CM | POA: Diagnosis not present

## 2017-07-10 LAB — COMPREHENSIVE METABOLIC PANEL
ALK PHOS: 79 U/L (ref 38–126)
ALT: 16 U/L (ref 14–54)
AST: 23 U/L (ref 15–41)
Albumin: 4.5 g/dL (ref 3.5–5.0)
Anion gap: 8 (ref 5–15)
BILIRUBIN TOTAL: 0.8 mg/dL (ref 0.3–1.2)
BUN: 17 mg/dL (ref 6–20)
CALCIUM: 9.2 mg/dL (ref 8.9–10.3)
CO2: 26 mmol/L (ref 22–32)
CREATININE: 0.69 mg/dL (ref 0.44–1.00)
Chloride: 104 mmol/L (ref 101–111)
GFR calc non Af Amer: >60 mL/min (ref >60)
Glucose, Bld: 108 mg/dL — ABNORMAL HIGH (ref 65–99)
Potassium: 3.3 mmol/L — ABNORMAL LOW (ref 3.5–5.1)
SODIUM: 138 mmol/L (ref 135–145)
Total Protein: 7.8 g/dL (ref 6.5–8.1)

## 2017-07-10 LAB — CBC
HEMATOCRIT: 41.7 % (ref 36.0–46.0)
HEMOGLOBIN: 14.4 g/dL (ref 12.0–15.0)
MCH: 32.6 pg (ref 26.0–34.0)
MCHC: 34.5 g/dL (ref 30.0–36.0)
MCV: 94.3 fL (ref 78.0–100.0)
Platelets: 283 10*3/uL (ref 150–400)
RBC: 4.42 MIL/uL (ref 3.87–5.11)
RDW: 13.4 % (ref 11.5–15.5)
WBC: 6.4 10*3/uL (ref 4.0–10.5)

## 2017-07-10 LAB — I-STAT BETA HCG BLOOD, ED (MC, WL, AP ONLY)

## 2017-07-10 LAB — ACETAMINOPHEN LEVEL: Acetaminophen (Tylenol), Serum: <10 ug/mL — ABNORMAL LOW (ref 10–30)

## 2017-07-10 LAB — RAPID URINE DRUG SCREEN, HOSP PERFORMED
AMPHETAMINES: NOT DETECTED
BARBITURATES: NOT DETECTED
Benzodiazepines: NOT DETECTED
Cocaine: POSITIVE — AB
Opiates: NOT DETECTED
TETRAHYDROCANNABINOL: POSITIVE — AB

## 2017-07-10 LAB — ETHANOL: Alcohol, Ethyl (B): <10 mg/dL (ref <10)

## 2017-07-10 LAB — SALICYLATE LEVEL

## 2017-07-10 MED ORDER — ALUM & MAG HYDROXIDE-SIMETH 200-200-20 MG/5ML PO SUSP: 30.0000 mL | Freq: Four times a day (QID) | ORAL | Status: DC | PRN

## 2017-07-10 MED ORDER — LISINOPRIL 5 MG PO TABS
5.0000 mg | ORAL_TABLET | Freq: Every day | ORAL | Status: DC
  Administered 2017-07-10: 5 mg via ORAL
  Filled 2017-07-10: qty 1

## 2017-07-10 MED ORDER — GABAPENTIN 300 MG PO CAPS
300.0000 mg | ORAL_CAPSULE | Freq: Two times a day (BID) | ORAL | Status: DC
  Administered 2017-07-10: 300 mg via ORAL
  Filled 2017-07-10: qty 1

## 2017-07-10 MED ORDER — ACETAMINOPHEN 325 MG PO TABS: 650.0000 mg | ORAL_TABLET | ORAL | Status: DC | PRN

## 2017-07-10 MED ORDER — ONDANSETRON HCL 4 MG PO TABS: 4.0000 mg | ORAL_TABLET | Freq: Three times a day (TID) | ORAL | Status: DC | PRN

## 2017-07-10 MED ORDER — NICOTINE 21 MG/24HR TD PT24
21.0000 mg | MEDICATED_PATCH | Freq: Every day | TRANSDERMAL | Status: DC
  Administered 2017-07-10: 21 mg via TRANSDERMAL
  Filled 2017-07-10: qty 1

## 2017-07-10 MED ORDER — QUETIAPINE FUMARATE 300 MG PO TABS: 300.0000 mg | ORAL_TABLET | Freq: Every day | ORAL | Status: DC
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
## 2017-07-10 NOTE — [facility] Assessment (Signed)
[facility] Assessment Progress Note   Case discussed with Donell SievertSpencer Simon, PA who recommends continued observation for safety. Pt's nurse Joanie CoddingtonLatricia, RN notified of recommendation. EDP Dr. Nicanor AlconPalumbo, MD made aware of disposition.   Princess BruinsAquicha Marlies Ligman, MSW, LCSW Therapeutic Triage Specialist  [phone]

## 2017-07-10 NOTE — ED Notes (Signed)
Pt A&O x 3, no distress, cooperative but anxious, constantly stating she is in a looney bin, presents IVCed by [employer].  Pt stated she was going to jump out of window to harm self.  Monitoring for safety, Q 15 min checks in effect.  Safety check for contraband completed, no items found.
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
## 2017-07-10 NOTE — BHH Suicide Risk Assessment (Signed)
Suicide Risk Assessment  Discharge Assessment   [facility] Discharge Suicide Risk Assessment   Principal Problem: Cocaine abuse with cocaine-induced mood disorder [facility]) Discharge Diagnoses:  Patient Active Problem List   Diagnosis Date Noted  . Cocaine abuse with cocaine-induced mood disorder ([place]) [F14.14] 07/10/2017  . Cannabis use disorder, severe, dependence ([place]) [F12.20] 07/10/2017   Pt was seen and chart reviewed with treatment team and Dr Darleene Cleaver. Pt stated she got in an argument with her boyfriend and threatened to jump out the second floor window. Pt denied drug use but UDS positive for cocaine and THC, BAL negative. Pt stated she has been going to [facility] for seven months and will return there upon discharge.  Pt denies suicidal/homicidal ideation, denies auditory/visual hallucinations and does not appear to be responding to internal stimuli. Pt met with Peer Support and has additional substance abuse treatment resources. Pt is stable and psychiatrically clear for discharge.   Total Time spent with patient: 45 minutes  Musculoskeletal: Strength & Muscle Tone: within normal limits Gait & Station: normal Patient leans: N/A  Psychiatric Specialty Exam:   Blood pressure (!) 149/97, pulse 74, temperature 98 F (36.7 C), temperature source Oral, resp. rate 18, height _0  (1.575 m), weight 72.6 kg (160 lb), SpO2 97 %.Body mass index is 29.26 kg/m.  General Appearance: Casual  Eye Contact::  Good  Speech:  Clear and Coherent and Normal Rate409  Volume:  Normal  Mood:  Anxious  Affect:  Congruent  Thought Process:  Coherent, Goal Directed and Linear  Orientation:  Full (Time, Place, and Person)  Thought Content:  Logical  Suicidal Thoughts:  No  Homicidal Thoughts:  No  Memory:  Immediate;   Good Recent;   Good Remote;   Fair  Judgement:  Fair  Insight:  Fair  Psychomotor Activity:  Normal  Concentration:  Good  Recall:  Good  Fund of Knowledge:Fair  Language:  Good  Akathisia:  No  Handed:  Right  AIMS (if indicated):     Assets:  Agricultural consultant Housing Physical Health  Sleep:     Cognition: WNL  ADL's:  Intact   Mental Status Per Nursing Assessment::   On Admission:     Demographic Factors:  Low socioeconomic status and Unemployed  Loss Factors: Financial problems/change in socioeconomic status  Historical Factors: Impulsivity  Risk Reduction Factors:   Sense of responsibility to family  Continued Clinical Symptoms:  Alcohol/Substance Abuse/Dependencies  Cognitive Features That Contribute To Risk:  Closed-mindedness    Suicide Risk:  Minimal: No identifiable suicidal ideation.  Patients presenting with no risk factors but with morbid ruminations; may be classified as minimal risk based on the severity of the depressive symptoms    Plan Of Care/Follow-up recommendations:  Activity:  as tolerated Diet:  Heart Healthy  Ethelene Hal, NP 07/10/2017, 12:05 PM

## 2017-07-10 NOTE — ED Provider Notes (Signed)
[facility]-EMERGENCY DEPT Provider Note   CSN: [id number] Arrival date & time: 07/09/17  2312     History   Chief Complaint Chief Complaint  Patient presents with  . Psychiatric Evaluation    HPI Gina Richards is a 56 y.o. female.  The history is provided by the patient.  Depression  Chronicity: unknown. The problem occurs constantly. The problem has not changed since onset.Pertinent negatives include no chest pain, no abdominal pain, no headaches and no shortness of breath. Nothing aggravates the symptoms. Nothing relieves the symptoms. She has tried nothing for the symptoms. The treatment provided no relief.  Under IVC for making suicidal statements.  Patient is speaking about people she knows using her phone to make drug deals but denies that she is suicidal.  She states she " feels some kind of way." She is " in her feelings."   Past Medical History:  Diagnosis Date  . Anxiety   . Bipolar 1 disorder ([facility])   . Depression   . Hypertension     There are no active problems to display for this patient.   History reviewed. No pertinent surgical history.  OB History    No data available       Home Medications    Prior to Admission medications   Medication Sig Start Date End Date Taking? Authorizing Provider  escitalopram (LEXAPRO) 20 MG tablet Take 20 mg by mouth daily. 03/13/17  Yes [provider]  lisinopril (PRINIVIL,ZESTRIL) 5 MG tablet Take 1 tablet (5 mg total) by mouth daily. 07/05/15  Yes Mady GemmaWestfall, Elizabeth C, PA-C  PROAIR HFA 108 (90 Base) MCG/ACT inhaler INHALE 1 TO 2 PUFFS BY MOUTH  EVERY 4 TO 6 HOURS AS NEEDED FOR SOB/WHEEZING 02/15/17  Yes [provider]  QUEtiapine (SEROQUEL) 300 MG tablet Take 300 mg by mouth at bedtime. 03/13/17  Yes [provider]  traZODone (DESYREL) 50 MG tablet Take 50 mg by mouth at bedtime. 03/13/17  Yes [provider]  acetaminophen (TYLENOL) 500 MG tablet Take 1 tablet  (500 mg total) by mouth every 6 (six) hours as needed. Patient not taking: Reported on 07/09/2017 03/14/17   Emi HolesLaw, Alexandra M, PA-C  cetirizine (ZYRTEC ALLERGY) 10 MG tablet Take 1 tablet (10 mg total) by mouth daily. Patient not taking: Reported on 07/09/2017 06/15/15   Everlene Farrieransie, William, PA-C  doxycycline (VIBRAMYCIN) 100 MG capsule Take 1 capsule (100 mg total) by mouth 2 (two) times daily. Patient not taking: Reported on 07/09/2017 05/23/17   Mardella LaymanHagler, Brian, MD  HYDROcodone-homatropine [facility](HYCODAN) 5-1.5 MG/5ML syrup Take 5 mLs by mouth every 6 (six) hours as needed for cough. Patient not taking: Reported on 07/09/2017 05/23/17   Mardella LaymanHagler, Brian, MD    Family History History reviewed. No pertinent family history.  Social History Social History   Tobacco Use  . Smoking status: Current Every Day Smoker    Packs/day: 1.50    Types: Cigarettes  . Smokeless tobacco: Never Used  Substance Use Topics  . Alcohol use: Yes    Comment: One beer but she will not inform how often.   . Drug use: No     Allergies   Patient has no known allergies.   Review of Systems Review of Systems  Respiratory: Negative for shortness of breath.   Cardiovascular: Negative for chest pain.  Gastrointestinal: Negative for abdominal pain.  Neurological: Negative for headaches.  Psychiatric/Behavioral: Positive for decreased concentration and depression. Negative for hallucinations.  All other systems reviewed and  are negative.    Physical Exam Updated Vital Signs BP (!) 178/121 (BP Location: Left Arm)   Pulse 82   Temp 98.8 F (37.1 C) (Oral)   Resp 18   Ht 5\' 2"  (1.575 m)   Wt 72.6 kg (160 lb)   SpO2 100%   BMI 29.26 kg/m   Physical Exam  Constitutional: She is oriented to person, place, and time. She appears well-developed and well-nourished. No distress.  HENT:  Head: Normocephalic and atraumatic.  Mouth/Throat: No oropharyngeal exudate.  Eyes: Conjunctivae are normal. Pupils are equal, round,  and reactive to light.  Neck: Normal range of motion. Neck supple.  Cardiovascular: Normal rate, regular rhythm, normal heart sounds and intact distal pulses.  Pulmonary/Chest: Effort normal and breath sounds normal. No stridor. She has no wheezes. She has no rales.  Abdominal: Soft. Bowel sounds are normal. She exhibits no mass. There is no tenderness. There is no rebound and no guarding.  Musculoskeletal: Normal range of motion.  Neurological: She is alert and oriented to person, place, and time.  Skin: Skin is warm and dry. Capillary refill takes less than 2 seconds.  Psychiatric: Her speech is rapid and/or pressured and tangential.     ED Treatments / Results   Vitals:   07/09/17 2348  BP: (!) 178/121  Pulse: 82  Resp: 18  Temp: 98.8 F (37.1 C)  SpO2: 100%    Labs (all labs ordered are listed, but only abnormal results are displayed)  Results for orders placed or performed during the hospital encounter of 07/09/17  Comprehensive metabolic panel  Result Value Ref Range   Sodium 138 135 - 145 mmol/L   Potassium 3.3 (L) 3.5 - 5.1 mmol/L   Chloride 104 101 - 111 mmol/L   CO2 26 22 - 32 mmol/L   Glucose, Bld 108 (H) 65 - 99 mg/dL   BUN 17 6 - 20 mg/dL   Creatinine, Ser 4.09 0.44 - 1.00 mg/dL   Calcium 9.2 8.9 - 81.1 mg/dL   Total Protein 7.8 6.5 - 8.1 g/dL   Albumin 4.5 3.5 - 5.0 g/dL   AST 23 15 - 41 U/L   ALT 16 14 - 54 U/L   Alkaline Phosphatase 79 38 - 126 U/L   Total Bilirubin 0.8 0.3 - 1.2 mg/dL   GFR calc non Af Amer >60 >60 mL/min   GFR calc Af Amer >60 >60 mL/min   Anion gap 8 5 - 15  Ethanol  Result Value Ref Range   Alcohol, Ethyl (B) <10 <10 mg/dL  Salicylate level  Result Value Ref Range   Salicylate Lvl <7.0 2.8 - 30.0 mg/dL  Acetaminophen level  Result Value Ref Range   Acetaminophen (Tylenol), Serum <10 (L) 10 - 30 ug/mL  cbc  Result Value Ref Range   WBC 6.4 4.0 - 10.5 K/uL   RBC 4.42 3.87 - 5.11 MIL/uL   Hemoglobin 14.4 12.0 - 15.0 g/dL    HCT 91.4 78.2 - 95.6 %   MCV 94.3 78.0 - 100.0 fL   MCH 32.6 26.0 - 34.0 pg   MCHC 34.5 30.0 - 36.0 g/dL   RDW 21.3 08.6 - 57.8 %   Platelets 283 150 - 400 K/uL  Rapid urine drug screen (hospital performed)  Result Value Ref Range   Opiates NONE DETECTED NONE DETECTED   Cocaine POSITIVE (A) NONE DETECTED   Benzodiazepines NONE DETECTED NONE DETECTED   Amphetamines NONE DETECTED NONE DETECTED   Tetrahydrocannabinol POSITIVE (A) NONE  DETECTED   Barbiturates NONE DETECTED NONE DETECTED  I-Stat beta hCG blood, ED  Result Value Ref Range   I-stat hCG, quantitative <5.0 <5 mIU/mL   Comment 3           No results found.    Procedures Procedures (including critical care time)    Final Clinical Impressions(s) / ED Diagnoses   Final diagnoses:  Suicidal ideation   Under IVC is medically cleared for psychiatry.  Holding ordered placed.    Kenney Going, MD 07/10/17 [id number]

## 2017-07-10 NOTE — Discharge Instructions (Signed)
For your behavioral health needs, you are advised to continue treatment with [facility]:       [facility]      [address]      [place], [place] [address]      [phone]

## 2017-07-10 NOTE — ED Notes (Signed)
Pt d/c from hospital with a friend. All items returned. D/C instructions given. Pt denies si and hi.

## 2017-07-10 NOTE — ED Notes (Signed)
[place] [facility]. Patient wanded by security. Ambulated with a steady gait to room 36. Belongings transported with patient.

## 2017-07-10 NOTE — BH Assessment (Signed)
B[facility] Assessment Progress Note  Per Thedore MinsMojeed Akintayo, MD, this pt does not require psychiatric hospitalization at this time.  Pt presents under IVC initiated by law enforcement, which Dr Jannifer FranklinAkintayo has rescinded.  [facility] with [facility].  This has been included in pt's discharge instructions.  Pt would also benefit from seeing Peer Support Specialists; they will be asked to speak to pt.  Pt's nurse, Jan, has been notified.  Gina Canninghomas Lucely Leard, MA Triage Specialist [phone]

## 2017-07-11 DIAGNOSIS — Z79899 Other long term (current) drug therapy: Secondary | ICD-10-CM | POA: Diagnosis not present

## 2017-07-24 DIAGNOSIS — Z79899 Other long term (current) drug therapy: Secondary | ICD-10-CM | POA: Diagnosis not present

## 2017-07-25 ENCOUNTER — Emergency Department (HOSPITAL_COMMUNITY)
Admission: EM | Admit: 2017-07-25 | Discharge: 2017-07-25 | Disposition: A | Discharge status: Home / Self Care | Payer: Medicare Other | Attending: Emergency Medicine | Admitting: Emergency Medicine

## 2017-07-25 ENCOUNTER — Emergency Department (HOSPITAL_COMMUNITY): Payer: Medicare Other

## 2017-07-25 ENCOUNTER — Other Ambulatory Visit: Payer: Self-pay

## 2017-07-25 ENCOUNTER — Encounter (HOSPITAL_COMMUNITY): Payer: Self-pay | Admitting: Emergency Medicine

## 2017-07-25 DIAGNOSIS — M545 Low back pain: Secondary | ICD-10-CM | POA: Diagnosis not present

## 2017-07-25 DIAGNOSIS — F1410 Cocaine abuse, uncomplicated: Secondary | ICD-10-CM | POA: Insufficient documentation

## 2017-07-25 DIAGNOSIS — M5431 Sciatica, right side: Secondary | ICD-10-CM | POA: Diagnosis not present

## 2017-07-25 DIAGNOSIS — Z79899 Other long term (current) drug therapy: Secondary | ICD-10-CM | POA: Diagnosis not present

## 2017-07-25 DIAGNOSIS — F419 Anxiety disorder, unspecified: Secondary | ICD-10-CM | POA: Insufficient documentation

## 2017-07-25 DIAGNOSIS — F1220 Cannabis dependence, uncomplicated: Secondary | ICD-10-CM | POA: Insufficient documentation

## 2017-07-25 DIAGNOSIS — Z5181 Encounter for therapeutic drug level monitoring: Secondary | ICD-10-CM | POA: Diagnosis not present

## 2017-07-25 DIAGNOSIS — I10 Essential (primary) hypertension: Secondary | ICD-10-CM | POA: Insufficient documentation

## 2017-07-25 DIAGNOSIS — F17210 Nicotine dependence, cigarettes, uncomplicated: Secondary | ICD-10-CM | POA: Diagnosis not present

## 2017-07-25 DIAGNOSIS — M79604 Pain in right leg: Secondary | ICD-10-CM | POA: Diagnosis present

## 2017-07-25 DIAGNOSIS — F319 Bipolar disorder, unspecified: Secondary | ICD-10-CM | POA: Insufficient documentation

## 2017-07-25 MED ORDER — CYCLOBENZAPRINE HCL 10 MG PO TABS: 10.0000 mg | ORAL_TABLET | Freq: Two times a day (BID) | ORAL | 0 refills | Status: DC | PRN

## 2017-07-25 MED ORDER — ACETAMINOPHEN 325 MG PO TABS
650.0000 mg | ORAL_TABLET | Freq: Once | ORAL | Status: AC
  Administered 2017-07-25: 650 mg via ORAL
  Filled 2017-07-25: qty 2

## 2017-07-25 NOTE — ED Notes (Signed)
Patient transported to X-ray 

## 2017-07-25 NOTE — ED Provider Notes (Signed)
[facility] Provider Note   CSN: [id number] Arrival date & time: 07/25/17  1250     History   Chief Complaint Chief Complaint  Patient presents with  . Leg Pain  . Foot Pain  . Arm Pain    HPI Gina Richards is a 56 y.o. female.  HPI   56 year old female presents today with complaints of right leg pain.  Patient notes that symptoms have been going on "from days to months".  She reports pain in her lower back radiating down the buttocks and to the back of the knee, no swelling or edema.  Worse with lifting her leg, worse with ambulation improved with rest.  Patient notes no trauma, no fever.  She notes some chronic back pain, reports wearing a brace.  She denies any lower extremity swelling or edema.   Past Medical History:  Diagnosis Date  . Anxiety   . Bipolar 1 disorder (HCC)   . Depression   . Hypertension     Patient Active Problem List   Diagnosis Date Noted  . Cocaine abuse with cocaine-induced mood disorder (HCC) 07/10/2017  . Cannabis use disorder, severe, dependence (HCC) 07/10/2017    History reviewed. No pertinent surgical history.  OB History    No data available      Home Medications    Prior to Admission medications   Medication Sig Start Date End Date Taking? Authorizing Provider  acetaminophen (TYLENOL) 500 MG tablet Take 1 tablet (500 mg total) by mouth every 6 (six) hours as needed. Patient not taking: Reported on 07/09/2017 03/14/17   Emi HolesLaw, Alexandra M, PA-C  cetirizine (ZYRTEC ALLERGY) 10 MG tablet Take 1 tablet (10 mg total) by mouth daily. Patient not taking: Reported on 07/09/2017 06/15/15   Everlene Farrieransie, William, PA-C  cyclobenzaprine (FLEXERIL) 10 MG tablet Take 1 tablet (10 mg total) by mouth 2 (two) times daily as needed for muscle spasms. 07/25/17   Ronie Barnhart, Tinnie GensJeffrey, PA-C  doxycycline (VIBRAMYCIN) 100 MG capsule Take 1 capsule (100 mg total) by mouth 2 (two) times daily. Patient not taking: Reported on  07/09/2017 05/23/17   Mardella LaymanHagler, Brian, MD  escitalopram (LEXAPRO) 20 MG tablet Take 20 mg by mouth daily. 03/13/17   [provider]  HYDROcodone-homatropine (HYCODAN) 5-1.5 MG/5ML syrup Take 5 mLs by mouth every 6 (six) hours as needed for cough. Patient not taking: Reported on 07/09/2017 05/23/17   Mardella LaymanHagler, Brian, MD  lisinopril (PRINIVIL,ZESTRIL) 5 MG tablet Take 1 tablet (5 mg total) by mouth daily. 07/05/15   Mady GemmaWestfall, Elizabeth C, PA-C  PROAIR HFA 108 (90 Base) MCG/ACT inhaler INHALE 1 TO 2 PUFFS BY MOUTH  EVERY 4 TO 6 HOURS AS NEEDED FOR SOB/WHEEZING 02/15/17   [provider]  QUEtiapine (SEROQUEL) 300 MG tablet Take 300 mg by mouth at bedtime. 03/13/17   [provider]  traZODone (DESYREL) 50 MG tablet Take 50 mg by mouth at bedtime. 03/13/17   [provider]    Family History History reviewed. No pertinent family history.  Social History Social History   Tobacco Use  . Smoking status: Current Every Day Smoker    Packs/day: 1.50    Types: Cigarettes  . Smokeless tobacco: Never Used  Substance Use Topics  . Alcohol use: Yes    Comment: One beer but she will not inform how often.   . Drug use: No     Allergies   Patient has no known allergies.   Review of Systems Review of Systems  All other systems reviewed and are negative.    Physical Exam Updated Vital Signs BP (!) 157/95 (BP Location: Right Arm)   Pulse 61   Temp 97.9 F (36.6 C) (Oral)   Resp 18   SpO2 100%   Physical Exam  Constitutional: She is oriented to person, place, and time. She appears well-developed and well-nourished.  HENT:  Head: Normocephalic and atraumatic.  Eyes: Conjunctivae are normal. Pupils are equal, round, and reactive to light. Right eye exhibits no discharge. Left eye exhibits no discharge. No scleral icterus.  Neck: Normal range of motion. No JVD present. No tracheal deviation present.  Pulmonary/Chest: Effort normal. No stridor.  Musculoskeletal:   Minor tenderness to left lower lumbar soft tissue-left lower leg without swelling or edema, no significant tenderness to palpation, straight leg positive, pedal pulse 2+, sensation intact  Neurological: She is alert and oriented to person, place, and time. Coordination normal.  Psychiatric: She has a normal mood and affect. Her behavior is normal. Judgment and thought content normal.  Nursing note and vitals reviewed.    ED Treatments / Results  Labs (all labs ordered are listed, but only abnormal results are displayed) Labs Reviewed - No data to display  EKG  EKG Interpretation None       Radiology Dg Lumbar Spine Complete  Result Date: 07/25/2017 CLINICAL DATA:  Lower back and leg pain for 2-3 weeks. EXAM: LUMBAR SPINE - COMPLETE 4+ VIEW COMPARISON:  Abdominal CT 11/10/2014 FINDINGS: No acute findings such as fracture or endplate erosion. No evidence of aggressive bone lesion. Mid and lower lumbar spondylosis. Degenerative disc narrowing greatest at L5-S1 where it is moderate. Pelvic calcifications from fibroid uterus, also seen in 2016. Prominent stool throughout the colon with ascending and rectal distention. IMPRESSION: 1. No acute osseous finding. 2. Spondylosis and degenerative disc narrowing. 3. Large stool volume suggesting constipation. Electronically Signed   By: Marnee SpringJonathon  Watts M.D.   On: 07/25/2017 14:52    Procedures Procedures (including critical care time)  Medications Ordered in ED Medications  acetaminophen (TYLENOL) tablet 650 mg (650 mg Oral Given 07/25/17 1505)     Initial Impression / Assessment and Plan / ED Course  I have reviewed the triage vital signs and the nursing notes.  Pertinent labs & imaging results that were available during my care of the patient were reviewed by me and considered in my medical decision making (see chart for details).     Final Clinical Impressions(s) / ED Diagnoses   Final diagnoses:  Sciatica of right side    Labs:    Imaging:  Consults:  Therapeutics: Tylenol  Discharge Meds: Flexeril  Assessment/Plan: 56 year old female presents today with likely sciatica.  No red flags here today.  Patient discharged with outpatient follow-up and strict return precautions.  She verbalized understanding and agreement to today's plan had no further questions or concerns.      ED Discharge Orders        Ordered    cyclobenzaprine (FLEXERIL) 10 MG tablet  2 times daily PRN     07/25/17 1550       Eyvonne MechanicHedges, Dmonte Maher, PA-C 07/25/17 2141    Maia PlanLong, Joshua G, MD 07/26/17 1056

## 2017-07-25 NOTE — Discharge Instructions (Signed)
Please read attached information. If you experience any new or worsening signs or symptoms please return to the emergency room for evaluation. Please follow-up with your primary care provider or specialist as discussed. Please use medication prescribed only as directed and discontinue taking if you have any concerning signs or symptoms.   °

## 2017-07-25 NOTE — ED Triage Notes (Signed)
Pt here with multiple complaints - states arm pain, left foot pain, and right leg pain extending from lower back all the way down right leg. VSS. Pt a/o x4.

## 2017-07-30 DIAGNOSIS — Z5181 Encounter for therapeutic drug level monitoring: Secondary | ICD-10-CM | POA: Diagnosis not present

## 2017-07-30 DIAGNOSIS — Z79899 Other long term (current) drug therapy: Secondary | ICD-10-CM | POA: Diagnosis not present

## 2017-08-01 DIAGNOSIS — Z79891 Long term (current) use of opiate analgesic: Secondary | ICD-10-CM | POA: Diagnosis not present

## 2017-08-01 DIAGNOSIS — Z5181 Encounter for therapeutic drug level monitoring: Secondary | ICD-10-CM | POA: Diagnosis not present

## 2017-08-06 ENCOUNTER — Ambulatory Visit (HOSPITAL_COMMUNITY)
Admission: EM | Admit: 2017-08-06 | Discharge: 2017-08-06 | Disposition: A | Discharge status: Home / Self Care | Payer: Medicare Other | Attending: Family Medicine | Admitting: Family Medicine

## 2017-08-06 ENCOUNTER — Encounter (HOSPITAL_COMMUNITY): Payer: Self-pay | Admitting: Emergency Medicine

## 2017-08-06 ENCOUNTER — Other Ambulatory Visit: Payer: Self-pay

## 2017-08-06 DIAGNOSIS — M791 Myalgia, unspecified site: Secondary | ICD-10-CM | POA: Diagnosis not present

## 2017-08-06 DIAGNOSIS — M79604 Pain in right leg: Secondary | ICD-10-CM | POA: Diagnosis not present

## 2017-08-06 DIAGNOSIS — T148XXA Other injury of unspecified body region, initial encounter: Secondary | ICD-10-CM | POA: Diagnosis not present

## 2017-08-06 MED ORDER — KETOROLAC TROMETHAMINE 60 MG/2ML IM SOLN
INTRAMUSCULAR | Status: AC
  Filled 2017-08-06: qty 2

## 2017-08-06 MED ORDER — TRAMADOL HCL 50 MG PO TABS: 50.0000 mg | ORAL_TABLET | Freq: Four times a day (QID) | ORAL | 0 refills | Status: DC | PRN

## 2017-08-06 MED ORDER — DICLOFENAC SODIUM 50 MG PO TBEC: 50.0000 mg | DELAYED_RELEASE_TABLET | Freq: Two times a day (BID) | ORAL | 0 refills | Status: DC

## 2017-08-06 MED ORDER — KETOROLAC TROMETHAMINE 60 MG/2ML IM SOLN
45.0000 mg | Freq: Once | INTRAMUSCULAR | Status: AC
  Administered 2017-08-06: 45 mg via INTRAMUSCULAR

## 2017-08-06 MED ORDER — DEXAMETHASONE SODIUM PHOSPHATE 10 MG/ML IJ SOLN
INTRAMUSCULAR | Status: AC
  Filled 2017-08-06: qty 1

## 2017-08-06 MED ORDER — DEXAMETHASONE SODIUM PHOSPHATE 10 MG/ML IJ SOLN
10.0000 mg | Freq: Once | INTRAMUSCULAR | Status: AC
  Administered 2017-08-06: 10 mg via INTRAMUSCULAR

## 2017-08-06 NOTE — ED Provider Notes (Signed)
[facility]    CSN: [id number] Arrival date & time: 08/06/17  1245     History   Chief Complaint No chief complaint on file.   HPI Gina Richards is a 56 y.o. female.   56 year old female complaining of right leg pain for several months. Radiates from the right buttock to the hamstring muscles Muscles and involves portion of the right knee. Denies trauma. She states that after it started a few months ago she continues to walk. She states she walks everywhere, to the store or wherever she needs to go. She is continue to walk even though she has persistent right lower extremity pain.      Past Medical History:  Diagnosis Date  . Anxiety   . Bipolar 1 disorder (HCC)   . Depression   . Hypertension     Patient Active Problem List   Diagnosis Date Noted  . Cocaine abuse with cocaine-induced mood disorder (HCC) 07/10/2017  . Cannabis use disorder, severe, dependence (HCC) 07/10/2017    History reviewed. No pertinent surgical history.  OB History    No data available       Home Medications    Prior to Admission medications   Medication Sig Start Date End Date Taking? Authorizing Provider  cyclobenzaprine (FLEXERIL) 10 MG tablet Take 1 tablet (10 mg total) by mouth 2 (two) times daily as needed for muscle spasms. 07/25/17   Hedges, Tinnie GensJeffrey, PA-C  diclofenac (VOLTAREN) 50 MG EC tablet Take 1 tablet (50 mg total) by mouth 2 (two) times daily. Take with food 08/06/17   Hayden RasmussenMabe, Kynlei Piontek, NP  escitalopram (LEXAPRO) 20 MG tablet Take 20 mg by mouth daily. 03/13/17   [provider]  lisinopril (PRINIVIL,ZESTRIL) 5 MG tablet Take 1 tablet (5 mg total) by mouth daily. 07/05/15   Mady GemmaWestfall, Elizabeth C, PA-C  PROAIR HFA 108 (90 Base) MCG/ACT inhaler INHALE 1 TO 2 PUFFS BY MOUTH  EVERY 4 TO 6 HOURS AS NEEDED FOR SOB/WHEEZING 02/15/17   [provider]  QUEtiapine (SEROQUEL) 300 MG tablet Take 300 mg by mouth at bedtime. 03/13/17   [provider]    traMADol (ULTRAM) 50 MG tablet Take 1 tablet (50 mg total) by mouth every 6 (six) hours as needed. 08/06/17   Hayden RasmussenMabe, Laurey Salser, NP  traZODone (DESYREL) 50 MG tablet Take 50 mg by mouth at bedtime. 03/13/17   [provider]    Family History No family history on file.  Social History Social History   Tobacco Use  . Smoking status: Current Every Day Smoker    Packs/day: 1.50    Types: Cigarettes  . Smokeless tobacco: Never Used  Substance Use Topics  . Alcohol use: Yes    Comment: One beer but she will not inform how often.   . Drug use: No     Allergies   Patient has no known allergies.   Review of Systems Review of Systems  Constitutional: Negative.  Negative for activity change, chills and fever.  HENT: Negative.   Respiratory: Negative.   Cardiovascular: Negative.   Musculoskeletal: Positive for myalgias. Negative for back pain, joint swelling, neck pain and neck stiffness.       As per HPI  Skin: Negative for color change, pallor and rash.  Neurological: Negative.   All other systems reviewed and are negative.    Physical Exam Triage Vital Signs ED Triage Vitals [08/06/17 1354]  Enc Vitals Group     BP (!) 160/110     Pulse  Rate 83     Resp 18     Temp 98.5 F (36.9 C)     Temp src      SpO2 100 %     Weight      Height      Head Circumference      Peak Flow      Pain Score 10     Pain Loc      Pain Edu?      Excl. in GC?    No data found.  Updated Vital Signs BP (!) 160/110   Pulse 83   Temp 98.5 F (36.9 C)   Resp 18   SpO2 100%   Visual Acuity Right Eye Distance:   Left Eye Distance:   Bilateral Distance:    Right Eye Near:   Left Eye Near:    Bilateral Near:     Physical Exam  Constitutional: She is oriented to person, place, and time. She appears well-developed and well-nourished. No distress.  HENT:  Head: Normocephalic and atraumatic.  Neck: Neck supple.  Pulmonary/Chest: Breath sounds normal.  Musculoskeletal: She  exhibits tenderness. She exhibits no edema or deformity.  Tenderness to the right hamstring muscles. Tenderness to the calf muscle. No swelling, tension or stiffness to these muscles. Minor tenderness over the right patella but no swelling. No tenderness to the anterior thigh musculature. No tenderness to the lower back, spine or paralumbar musculature. No tenderness to the upper right buttock musculature. Patient is noted to walk stiff legged in with pain. Needs assistance getting onto and off the exam table. Distal neurovascular motor sensory is grossly intact.  Neurological: She is alert and oriented to person, place, and time.  Skin: Skin is warm and dry.  Nursing note and vitals reviewed.    UC Treatments / Results  Labs (all labs ordered are listed, but only abnormal results are displayed) Labs Reviewed - No data to display  EKG  EKG Interpretation None       Radiology No results found.  Procedures Procedures (including critical care time)  Medications Ordered in UC Medications  ketorolac (TORADOL) injection 45 mg (not administered)  dexamethasone (DECADRON) injection 10 mg (not administered)     Initial Impression / Assessment and Plan / UC Course  I have reviewed the triage vital signs and the nursing notes.  Pertinent labs & imaging results that were available during my care of the patient were reviewed by me and considered in my medical decision making (see chart for details).    Limit walking and weightbearing for the next few days. Take the medication as directed. Continue to apply heat off and on but not too long one time. Need to see a primary care doctor as soon as possible.    Final Clinical Impressions(s) / UC Diagnoses   Final diagnoses:  Muscle strain  Myalgia    ED Discharge Orders        Ordered    diclofenac (VOLTAREN) 50 MG EC tablet  2 times daily     08/06/17 1455    traMADol (ULTRAM) 50 MG tablet  Every 6 hours PRN     08/06/17 1455         Controlled Substance Prescriptions  Controlled Substance Registry consulted? Not Applicable   Hayden RasmussenMabe, Brandyn Lowrey, NP 08/06/17 [phone]

## 2017-08-06 NOTE — ED Triage Notes (Signed)
Pt c/o R leg pain ongoing for a few months. Pt seen in the ER on the 5th for same issue.

## 2017-08-06 NOTE — Discharge Instructions (Signed)
Limit walking and weightbearing for the next few days. Take the medication as directed. Continue to apply heat off and on but not too long one time. Need to see a primary care doctor as soon as possible.

## 2017-08-07 ENCOUNTER — Telehealth (HOSPITAL_COMMUNITY): Payer: Self-pay | Admitting: Emergency Medicine

## 2017-08-07 DIAGNOSIS — Z5181 Encounter for therapeutic drug level monitoring: Secondary | ICD-10-CM | POA: Diagnosis not present

## 2017-08-07 DIAGNOSIS — Z79899 Other long term (current) drug therapy: Secondary | ICD-10-CM | POA: Diagnosis not present

## 2017-08-07 MED ORDER — DICLOFENAC SODIUM 50 MG PO TBEC: 50.0000 mg | DELAYED_RELEASE_TABLET | Freq: Two times a day (BID) | ORAL | 0 refills | Status: DC

## 2017-08-07 NOTE — Telephone Encounter (Signed)
Resent RX to pharmacy per pt request

## 2017-08-09 DIAGNOSIS — Z5181 Encounter for therapeutic drug level monitoring: Secondary | ICD-10-CM | POA: Diagnosis not present

## 2017-08-09 DIAGNOSIS — Z79899 Other long term (current) drug therapy: Secondary | ICD-10-CM | POA: Diagnosis not present

## 2017-08-15 DIAGNOSIS — Z5181 Encounter for therapeutic drug level monitoring: Secondary | ICD-10-CM | POA: Diagnosis not present

## 2017-08-15 DIAGNOSIS — Z79899 Other long term (current) drug therapy: Secondary | ICD-10-CM | POA: Diagnosis not present

## 2017-08-17 DIAGNOSIS — Z5181 Encounter for therapeutic drug level monitoring: Secondary | ICD-10-CM | POA: Diagnosis not present

## 2017-08-17 DIAGNOSIS — Z79899 Other long term (current) drug therapy: Secondary | ICD-10-CM | POA: Diagnosis not present

## 2017-08-22 DIAGNOSIS — Z79899 Other long term (current) drug therapy: Secondary | ICD-10-CM | POA: Diagnosis not present

## 2017-08-22 DIAGNOSIS — Z5181 Encounter for therapeutic drug level monitoring: Secondary | ICD-10-CM | POA: Diagnosis not present

## 2017-08-24 DIAGNOSIS — Z5181 Encounter for therapeutic drug level monitoring: Secondary | ICD-10-CM | POA: Diagnosis not present

## 2017-08-24 DIAGNOSIS — Z79899 Other long term (current) drug therapy: Secondary | ICD-10-CM | POA: Diagnosis not present

## 2017-08-28 DIAGNOSIS — Z79899 Other long term (current) drug therapy: Secondary | ICD-10-CM | POA: Diagnosis not present

## 2017-08-28 DIAGNOSIS — Z5181 Encounter for therapeutic drug level monitoring: Secondary | ICD-10-CM | POA: Diagnosis not present

## 2017-08-30 ENCOUNTER — Other Ambulatory Visit: Payer: Self-pay

## 2017-08-30 ENCOUNTER — Emergency Department (HOSPITAL_COMMUNITY)
Admission: EM | Admit: 2017-08-30 | Discharge: 2017-08-30 | Disposition: A | Discharge status: Home / Self Care | Payer: Medicare Other | Attending: Emergency Medicine | Admitting: Emergency Medicine

## 2017-08-30 ENCOUNTER — Emergency Department (HOSPITAL_COMMUNITY): Payer: Medicare Other

## 2017-08-30 ENCOUNTER — Encounter (HOSPITAL_COMMUNITY): Payer: Self-pay

## 2017-08-30 DIAGNOSIS — Z5181 Encounter for therapeutic drug level monitoring: Secondary | ICD-10-CM | POA: Diagnosis not present

## 2017-08-30 DIAGNOSIS — Z79899 Other long term (current) drug therapy: Secondary | ICD-10-CM | POA: Insufficient documentation

## 2017-08-30 DIAGNOSIS — G8929 Other chronic pain: Secondary | ICD-10-CM | POA: Insufficient documentation

## 2017-08-30 DIAGNOSIS — F17210 Nicotine dependence, cigarettes, uncomplicated: Secondary | ICD-10-CM | POA: Diagnosis not present

## 2017-08-30 DIAGNOSIS — I10 Essential (primary) hypertension: Secondary | ICD-10-CM | POA: Insufficient documentation

## 2017-08-30 DIAGNOSIS — M79604 Pain in right leg: Secondary | ICD-10-CM | POA: Insufficient documentation

## 2017-08-30 DIAGNOSIS — M25561 Pain in right knee: Secondary | ICD-10-CM | POA: Diagnosis not present

## 2017-08-30 MED ORDER — TRAMADOL HCL 50 MG PO TABS
50.0000 mg | ORAL_TABLET | Freq: Once | ORAL | Status: AC
  Administered 2017-08-30: 50 mg via ORAL
  Filled 2017-08-30: qty 1

## 2017-08-30 MED ORDER — KETOROLAC TROMETHAMINE 10 MG PO TABS: 10.0000 mg | ORAL_TABLET | Freq: Four times a day (QID) | ORAL | 0 refills | Status: DC | PRN

## 2017-08-30 MED ORDER — ACETAMINOPHEN 500 MG PO TABS
1000.0000 mg | ORAL_TABLET | Freq: Once | ORAL | Status: AC
  Administered 2017-08-30: 1000 mg via ORAL
  Filled 2017-08-30: qty 2

## 2017-08-30 NOTE — ED Provider Notes (Signed)
[facility] EMERGENCY DEPARTMENT Provider Note   CSN: [id number] Arrival date & time: 08/30/17  4098     History   Chief Complaint Chief Complaint  Patient presents with  . Leg Pain    HPI Gina Richards is a 57 y.o. female with history of anxiety, depression, hypertension, cocaine abuse presents for evaluation of right leg pain for 2 months. States "she can't take it anymore" has been evaluated at urgent care and emergency department for this twice, states they have never done an x-ray and she doesn't know why she has pain. He has been prescribed tramadol in the past which helps the pain but ran out several days ago. Has not tried any ibuprofen or Tylenol. States she can't sleep because of the pain which is making her insomnia worse. Denies known trauma, numbness, weakness, tingling distally, leg swelling or calf swelling with pain, fevers, chills. States she was walking to the emergency department and heard a clicking/popping noise from her right knee, she denies previous injuries or surgeries. No IV drug use. No history of DVT/PE. No recent prolonged immobilization, travel, malignancy, estrogen use. Aggravating factors include walking and flexion at the knee. Alleviated with rest and tramadol. Severity is 9/10.  HPI  Past Medical History:  Diagnosis Date  . Anxiety   . Bipolar 1 disorder (HCC)   . Depression   . Hypertension     Patient Active Problem List   Diagnosis Date Noted  . Cocaine abuse with cocaine-induced mood disorder (HCC) 07/10/2017  . Cannabis use disorder, severe, dependence (HCC) 07/10/2017    History reviewed. No pertinent surgical history.  OB History    No data available       Home Medications    Prior to Admission medications   Medication Sig Start Date End Date Taking? Authorizing Provider  cyclobenzaprine (FLEXERIL) 10 MG tablet Take 1 tablet (10 mg total) by mouth 2 (two) times daily as needed for muscle spasms. 07/25/17    Hedges, Tinnie Gens, PA-C  diclofenac (VOLTAREN) 50 MG EC tablet Take 1 tablet (50 mg total) by mouth 2 (two) times daily. Take with food 08/07/17   Hayden Rasmussen, NP  escitalopram (LEXAPRO) 20 MG tablet Take 20 mg by mouth daily. 03/13/17   [provider]  ketorolac (TORADOL) 10 MG tablet Take 1 tablet (10 mg total) by mouth every 6 (six) hours as needed. 08/30/17   Liberty Handy, PA-C  lisinopril (PRINIVIL,ZESTRIL) 5 MG tablet Take 1 tablet (5 mg total) by mouth daily. 07/05/15   Mady Gemma, PA-C  PROAIR HFA 108 (90 Base) MCG/ACT inhaler INHALE 1 TO 2 PUFFS BY MOUTH  EVERY 4 TO 6 HOURS AS NEEDED FOR SOB/WHEEZING 02/15/17   [provider]  QUEtiapine (SEROQUEL) 300 MG tablet Take 300 mg by mouth at bedtime. 03/13/17   [provider]  traMADol (ULTRAM) 50 MG tablet Take 1 tablet (50 mg total) by mouth every 6 (six) hours as needed. 08/06/17   Hayden Rasmussen, NP  traZODone (DESYREL) 50 MG tablet Take 50 mg by mouth at bedtime. 03/13/17   [provider]    Family History No family history on file.  Social History Social History   Tobacco Use  . Smoking status: Current Every Day Smoker    Packs/day: 1.50    Types: Cigarettes  . Smokeless tobacco: Never Used  Substance Use Topics  . Alcohol use: Yes    Comment: One beer but she will not inform how often.   Marland Kitchen  Drug use: No     Allergies   Patient has no known allergies.   Review of Systems Review of Systems  Musculoskeletal: Positive for myalgias.  All other systems reviewed and are negative.    Physical Exam Updated Vital Signs BP (!) 143/97 (BP Location: Right Arm)   Pulse 73   Temp 97.7 F (36.5 C) (Oral)   Resp 18   Ht 5' 2.5" (1.588 m)   Wt 72.6 kg (160 lb)   SpO2 96%   BMI 28.80 kg/m   Physical Exam  Constitutional: She is oriented to person, place, and time. She appears well-developed and well-nourished. No distress.  NAD.  HENT:  Head: Normocephalic and atraumatic.    Right Ear: External ear normal.  Left Ear: External ear normal.  Nose: Nose normal.  Eyes: Conjunctivae and EOM are normal. No scleral icterus.  Neck: Normal range of motion. Neck supple.  Cardiovascular: Normal rate, regular rhythm and normal heart sounds.  No murmur heard. 2+ DP and popliteal pulses bilaterally. No asymmetric lower extremity edema or calf tenderness. No varicosities seen.  Pulmonary/Chest: Effort normal and breath sounds normal. She has no wheezes.  Musculoskeletal: Normal range of motion. She exhibits tenderness. She exhibits no deformity.       Right knee: Tenderness found.       Legs: Diffuse right leg pain from mid thigh to mid shin, anteriorly. Mild focal tenderness to the left medial knee. Mild pain with passive range of motion of the knee. No popliteal space or calf tenderness. No valgus or varus laxity. Negative Lachman's and posterior drawer.  Neurological: She is alert and oriented to person, place, and time.  Sensation to light touch intact in lower extremities. 5/5 strength in lower extremities. Normal DTR in knees bilaterally.  Skin: Skin is warm and dry. Capillary refill takes less than 2 seconds.  Psychiatric: She has a normal mood and affect. Her behavior is normal. Judgment and thought content normal.  Nursing note and vitals reviewed.    ED Treatments / Results  Labs (all labs ordered are listed, but only abnormal results are displayed) Labs Reviewed - No data to display  EKG  EKG Interpretation None       Radiology Dg Knee Complete 4 Views Right  Result Date: 08/30/2017 CLINICAL DATA:  Anterior right knee pain for the past year. EXAM: RIGHT KNEE - COMPLETE 4+ VIEW COMPARISON:  None. FINDINGS: No evidence of fracture, dislocation, or joint effusion. No evidence of arthropathy or other focal bone abnormality. Soft tissues are unremarkable. IMPRESSION: Negative. Electronically Signed   By: Obie DredgeWilliam T Derry M.D.   On: 08/30/2017 10:57     Procedures Procedures (including critical care time)  Medications Ordered in ED Medications  traMADol (ULTRAM) tablet 50 mg (50 mg Oral Given 08/30/17 1118)  acetaminophen (TYLENOL) tablet 1,000 mg (1,000 mg Oral Given 08/30/17 1118)     Initial Impression / Assessment and Plan / ED Course  I have reviewed the triage vital signs and the nursing notes.  Pertinent labs & imaging results that were available during my care of the patient were reviewed by me and considered in my medical decision making (see chart for details).     57 year old here with chronic right leg pain, atraumatic. Has been prescribed tramadol in the past for pain and she ran out several days ago. No new trauma. No history of DVT/PE, septic arthritis, IV drug use.  And exam, she has diffuse tenderness to right lower extremity. Mild focal  right medial knee pain. No asymmetric lower extremity edema or calf tenderness. No tenderness along deep venous system of the legs. Neurologically intact. No midline CTL spine or SI/sciatic notch tenderness. Negative SLR. She is drowsy during exam, keeps her eyes closed during musculoskeletal exam and passive range of motion of the knee and does not look to have significant pain during my exam.  Final Clinical Impressions(s) / ED Diagnoses  X-ray of right knee unremarkable. Doubt septic arthritis or DVT. I discussed to manic management with patient for chronic pain. We'll defer prescription of narcotic pain medicines today, discussed strict policy in ED for chronic pain management with opiates. Encouraged her to follow-up with PCP or pedal for continued pain. Final diagnoses:  Chronic pain of right lower extremity    ED Discharge Orders        Ordered    ketorolac (TORADOL) 10 MG tablet  Every 6 hours PRN     08/30/17 1123       Liberty Handy, [place] 08/30/17 1124    Tegeler, Canary Brim, MD 08/30/17 1145

## 2017-08-30 NOTE — ED Notes (Signed)
Pt roused from sleep\.

## 2017-08-30 NOTE — ED Notes (Addendum)
Pt speaking on phone when entered room. Continues talkking

## 2017-08-30 NOTE — ED Notes (Signed)
Patient transported to X-ray 

## 2017-08-30 NOTE — ED Notes (Signed)
Patient sleeping while vitals were obtained

## 2017-08-30 NOTE — Discharge Instructions (Addendum)
Take 1000 mg tylenol every 8 hours for pain. Additionally take toradol 10 mg every 8 hours. Ice. Over the counter lidocaine patch will also help.  The emergency department has strict policy regarding treatment of chronic pain. We are unable to continuously prescribe narcotic pain medications like tramadol, oxycodone, hydrocodone. Follow up with primary care provider for continued management of chronic pain. See [facility] to establish care with primary care provider.

## 2017-08-30 NOTE — ED Notes (Signed)
Pt states she understands instructions. Home stable with steady gait. 

## 2017-08-30 NOTE — ED Triage Notes (Signed)
Per Pt, Pt is coming from home with complaints of right leg pain for "quite some time and I can't take it." Pt reports being given tramadol last time, and it has note helped. The pain started in the right buttocks and radiates to the right leg.

## 2017-09-04 DIAGNOSIS — Z79899 Other long term (current) drug therapy: Secondary | ICD-10-CM | POA: Diagnosis not present

## 2017-09-04 DIAGNOSIS — Z5181 Encounter for therapeutic drug level monitoring: Secondary | ICD-10-CM | POA: Diagnosis not present

## 2017-09-06 DIAGNOSIS — Z79899 Other long term (current) drug therapy: Secondary | ICD-10-CM | POA: Diagnosis not present

## 2017-09-06 DIAGNOSIS — Z5181 Encounter for therapeutic drug level monitoring: Secondary | ICD-10-CM | POA: Diagnosis not present

## 2017-09-11 DIAGNOSIS — Z79899 Other long term (current) drug therapy: Secondary | ICD-10-CM | POA: Diagnosis not present

## 2017-09-11 DIAGNOSIS — Z5181 Encounter for therapeutic drug level monitoring: Secondary | ICD-10-CM | POA: Diagnosis not present

## 2017-09-13 DIAGNOSIS — Z79899 Other long term (current) drug therapy: Secondary | ICD-10-CM | POA: Diagnosis not present

## 2017-09-13 DIAGNOSIS — Z5181 Encounter for therapeutic drug level monitoring: Secondary | ICD-10-CM | POA: Diagnosis not present

## 2017-09-17 DIAGNOSIS — Z79899 Other long term (current) drug therapy: Secondary | ICD-10-CM | POA: Diagnosis not present

## 2017-09-17 DIAGNOSIS — Z5181 Encounter for therapeutic drug level monitoring: Secondary | ICD-10-CM | POA: Diagnosis not present

## 2017-09-19 DIAGNOSIS — Z79899 Other long term (current) drug therapy: Secondary | ICD-10-CM | POA: Diagnosis not present

## 2017-09-19 DIAGNOSIS — Z5181 Encounter for therapeutic drug level monitoring: Secondary | ICD-10-CM | POA: Diagnosis not present

## 2019-04-01 ENCOUNTER — Ambulatory Visit (HOSPITAL_COMMUNITY)
Admission: EM | Admit: 2019-04-01 | Discharge: 2019-04-01 | Disposition: A | Discharge status: Home / Self Care | Payer: Medicare HMO | Attending: Urgent Care | Admitting: Urgent Care

## 2019-04-01 ENCOUNTER — Other Ambulatory Visit: Payer: Self-pay

## 2019-04-01 ENCOUNTER — Encounter (HOSPITAL_COMMUNITY): Payer: Self-pay

## 2019-04-01 DIAGNOSIS — M545 Low back pain, unspecified: Secondary | ICD-10-CM

## 2019-04-01 DIAGNOSIS — M5136 Other intervertebral disc degeneration, lumbar region: Secondary | ICD-10-CM

## 2019-04-01 DIAGNOSIS — M1612 Unilateral primary osteoarthritis, left hip: Secondary | ICD-10-CM

## 2019-04-01 MED ORDER — METHYLPREDNISOLONE SODIUM SUCC 125 MG IJ SOLR
INTRAMUSCULAR | Status: AC
  Filled 2019-04-01: qty 2

## 2019-04-01 MED ORDER — DICLOFENAC SODIUM 50 MG PO TBEC: 50.0000 mg | DELAYED_RELEASE_TABLET | Freq: Two times a day (BID) | ORAL | 0 refills | Status: DC

## 2019-04-01 MED ORDER — CYCLOBENZAPRINE HCL 10 MG PO TABS: 10.0000 mg | ORAL_TABLET | Freq: Two times a day (BID) | ORAL | 0 refills | Status: DC | PRN

## 2019-04-01 MED ORDER — METHYLPREDNISOLONE SODIUM SUCC 125 MG IJ SOLR
125.0000 mg | Freq: Once | INTRAMUSCULAR | Status: AC
  Administered 2019-04-01: 125 mg via INTRAMUSCULAR

## 2019-04-01 NOTE — ED Triage Notes (Signed)
Pt presents with recurrent right side leg pain that starts at her hip and radiates down her leg for past few weeks

## 2019-04-01 NOTE — ED Provider Notes (Signed)
MRN: [id number] DOB: 1961/04/30  Subjective:   Prudencio BurlyVivian T Kutsch is a 58 y.o. female presenting for 1 day history acute onset recurrent low back and right hip pain.  Reports that symptoms are moderate to severe, constant, sharp/achy/throbbing.  Has used tramadol, Flexeril with minimal relief.  Chart review demonstrates that patient has moderate osteoarthritis of the right hip joint and degenerative disc disease at the lumbosacral level.  Patient is currently retired, did not know that she had arthritis.  She does not have an orthopedist.  Denies fever, weakness, trauma, falls, recent car accidents, recent strenuous work activities.  No current facility-administered medications for this encounter.   Current Outpatient Medications:  .  cyclobenzaprine (FLEXERIL) 10 MG tablet, Take 1 tablet (10 mg total) by mouth 2 (two) times daily as needed for muscle spasms., Disp: 20 tablet, Rfl: 0 .  diclofenac (VOLTAREN) 50 MG EC tablet, Take 1 tablet (50 mg total) by mouth 2 (two) times daily. Take with food, Disp: 15 tablet, Rfl: 0 .  escitalopram (LEXAPRO) 20 MG tablet, Take 20 mg by mouth daily., Disp: , Rfl: 3 .  ketorolac (TORADOL) 10 MG tablet, Take 1 tablet (10 mg total) by mouth every 6 (six) hours as needed., Disp: 20 tablet, Rfl: 0 .  lisinopril (PRINIVIL,ZESTRIL) 5 MG tablet, Take 1 tablet (5 mg total) by mouth daily., Disp: 30 tablet, Rfl: 0 .  PROAIR HFA 108 (90 Base) MCG/ACT inhaler, INHALE 1 TO 2 PUFFS BY MOUTH  EVERY 4 TO 6 HOURS AS NEEDED FOR SOB/WHEEZING, Disp: , Rfl: 0 .  QUEtiapine (SEROQUEL) 300 MG tablet, Take 300 mg by mouth at bedtime., Disp: , Rfl: 3 .  traMADol (ULTRAM) 50 MG tablet, Take 1 tablet (50 mg total) by mouth every 6 (six) hours as needed., Disp: 15 tablet, Rfl: 0 .  traZODone (DESYREL) 50 MG tablet, Take 50 mg by mouth at bedtime., Disp: , Rfl: 3    No Known Allergies   Past Medical History:  Diagnosis Date  . Anxiety   . Bipolar 1 disorder (HCC)   . Depression   .  Hypertension      No past surgical history on file.   ROS  Objective:   Vitals: BP (!) 129/101 (BP Location: Right Arm)   Pulse 70   Temp 98.5 F (36.9 C) (Oral)   Resp 17   SpO2 97%   Physical Exam Constitutional:      General: She is not in acute distress.    Appearance: Normal appearance. She is well-developed. She is not ill-appearing.  HENT:     Head: Normocephalic and atraumatic.     Nose: Nose normal.     Mouth/Throat:     Mouth: Mucous membranes are moist.     Pharynx: Oropharynx is clear.  Eyes:     General: No scleral icterus.    Extraocular Movements: Extraocular movements intact.     Pupils: Pupils are equal, round, and reactive to light.  Cardiovascular:     Rate and Rhythm: Normal rate.  Pulmonary:     Effort: Pulmonary effort is normal.  Musculoskeletal:     Right hip: She exhibits decreased range of motion and tenderness. She exhibits no swelling, no crepitus and no deformity.     Lumbar back: She exhibits decreased range of motion, tenderness and spasm. She exhibits no bony tenderness, no swelling and no edema.  Skin:    General: Skin is warm and dry.  Neurological:     General: No focal  deficit present.     Mental Status: She is alert and oriented to person, place, and time.  Psychiatric:        Mood and Affect: Mood normal.        Behavior: Behavior normal.     Assessment and Plan :   1. Degenerative disc disease, lumbar   2. Osteoarthritis of left hip, unspecified osteoarthritis type   3. Acute right-sided low back pain without sciatica     IM Solu-Medrol in clinic.  I will have patient use diclofenac and Flexeril otherwise.  Medication refills provided.  Patient is to contact [facility] or another orthopedic practice for consult on her osteoarthritis and degenerative disc disease.  Counseled on appropriate joint and back care. Counseled patient on potential for adverse effects with medications prescribed/recommended today, ER  and return-to-clinic precautions discussed, patient verbalized understanding.    Jaynee Eagles, [place] 04/01/19 1209

## 2019-04-19 ENCOUNTER — Ambulatory Visit (HOSPITAL_COMMUNITY)
Admission: EM | Admit: 2019-04-19 | Discharge: 2019-04-19 | Disposition: A | Discharge status: Home / Self Care | Payer: Medicare HMO | Attending: Family Medicine | Admitting: Family Medicine

## 2019-04-19 ENCOUNTER — Encounter (HOSPITAL_COMMUNITY): Payer: Self-pay

## 2019-04-19 ENCOUNTER — Other Ambulatory Visit: Payer: Self-pay

## 2019-04-19 DIAGNOSIS — F329 Major depressive disorder, single episode, unspecified: Secondary | ICD-10-CM

## 2019-04-19 DIAGNOSIS — F32A Depression, unspecified: Secondary | ICD-10-CM

## 2019-04-19 DIAGNOSIS — M79604 Pain in right leg: Secondary | ICD-10-CM | POA: Diagnosis not present

## 2019-04-19 DIAGNOSIS — I10 Essential (primary) hypertension: Secondary | ICD-10-CM

## 2019-04-19 MED ORDER — TRAMADOL HCL 50 MG PO TABS: 50.0000 mg | ORAL_TABLET | Freq: Four times a day (QID) | ORAL | 0 refills | Status: DC | PRN

## 2019-04-19 MED ORDER — LISINOPRIL 5 MG PO TABS: 5.0000 mg | ORAL_TABLET | Freq: Every day | ORAL | 2 refills | Status: DC

## 2019-04-19 MED ORDER — ESCITALOPRAM OXALATE 20 MG PO TABS: 20.0000 mg | ORAL_TABLET | Freq: Every day | ORAL | 2 refills | Status: DC

## 2019-04-19 NOTE — ED Provider Notes (Addendum)
[name]   [id number] 04/19/19 Arrival Time: [place]:  1. Right leg pain   2. Depression, unspecified depression type   3. Uncontrolled hypertension     No indication for imaging of leg today; acute on chronic pain. No new injury. Discussed. Recommend f/u with sports medicine as below. May benefit from PT. Will begin HTN medication again. Recommend rechecking BP within the next two weeks. May recheck here. Depression stable. No SI or extreme sadness. Will start Lexapro again. May recheck here as needed. Refilled medications as requested. Encouraged her to establish care with PCP.  Meds ordered this encounter  Medications  . lisinopril (ZESTRIL) 5 MG tablet    Sig: Take 1 tablet (5 mg total) by mouth daily.    Dispense:  30 tablet    Refill:  2  . escitalopram (LEXAPRO) 20 MG tablet    Sig: Take 1 tablet (20 mg total) by mouth daily.    Dispense:  30 tablet    Refill:  2  . traMADol (ULTRAM) 50 MG tablet    Sig: Take 1 tablet (50 mg total) by mouth every 6 (six) hours as needed.    Dispense:  15 tablet    Refill:  0    Follow-up Information    Schedule an appointment as soon as possible for a visit  with [place].   Contact information: [address] [phone]         [place] Controlled Substances Registry consulted for this patient. I feel the risk/benefit ratio today is favorable for proceeding with this prescription for a controlled substance. Medication sedation precautions given.  Reviewed expectations re: course of current medical issues. Questions answered. Outlined signs and symptoms indicating need for more acute intervention. Patient verbalized understanding. After Visit Summary given.  SUBJECTIVE: History from: patient. Gina Richards is a 58 y.o. female with chronic right leg pain who reports an acute exacerbation over the past few days; gradual onset.  Right leg pain intermittent without reported injury. Occasional radiation down right leg. No recent trauma or injury. Acute pain has waxed and waned since beginning. Aggravating factors: certain leg movements and prolonged walking/standing. Alleviating factors: "sitting helps a little". Associated symptoms: none reported. Extremity sensation changes or weakness: none. Self treatment: tried OTCs without relief of pain. History of similar: yes.  History reviewed. No pertinent surgical history.   Increased blood pressure noted today. Reports that she is out of BP medicaiton. Requests refill. She reports no chest pain on exertion, no dyspnea on exertion, no swelling of ankles, no orthostatic dizziness or lightheadedness, no orthopnea or paroxysmal nocturnal dyspnea, no palpitations and no intermittent claudication symptoms.  Also requests refill of Lexapro. Has been out of her Lexapro but reports no sadness. Wishes to continue. Stable depression without recent exacerbation. No SI. Functions well at normal daily activities. Does feel depression more prominent when she smokes marijuana regularly. Denies current cocaine use.  ROS: As per HPI. All other systems negative.    OBJECTIVE:  Vitals:   04/19/19 1319  BP: (!) 153/98  Pulse: 74  Resp: 15  Temp: 98.7 F (37.1 C)  TempSrc: Oral  SpO2: 98%    General appearance: alert; no distress HEENT: [place]; AT Neck: supple with FROM CV: RRR Resp: unlabored respirations Abd: soft; non-tender Extremities: . RLE: warm and well perfused; reports poorly localized mild to moderate tenderness over right posterior and lateral thigh; without  gross deformities; with no swelling; with no bruising; ROM: normal with reported discomfort CV: brisk extremity capillary refill of RLE; 2+ DP/PT pulse of RLE. Skin: warm and dry; no visible rashes Neurologic: gait normal; normal reflexes of RLE and LLE; normal sensation of RLE and LLE; normal strength of RLE and  LLE Psychological: alert and cooperative; normal mood and affect  No Known Allergies  Past Medical History:  Diagnosis Date  . Anxiety   . Bipolar 1 disorder (HCC)   . Depression   . Hypertension    Social History   Socioeconomic History  . Marital status: Single    Spouse name: Not on file  . Number of children: Not on file  . Years of education: Not on file  . Highest education level: Not on file  Occupational History  . Not on file  Social Needs  . Financial resource strain: Not on file  . Food insecurity    Worry: Not on file    Inability: Not on file  . Transportation needs    Medical: Not on file    Non-medical: Not on file  Tobacco Use  . Smoking status: Current Every Day Smoker    Packs/day: 1.50    Types: Cigarettes  . Smokeless tobacco: Never Used  Substance and Sexual Activity  . Alcohol use: Yes    Comment: One beer but she will not inform how often.   . Drug use: No  . Sexual activity: Not on file  Lifestyle  . Physical activity    Days per week: Not on file    Minutes per session: Not on file  . Stress: Not on file  Relationships  . Social Musicianconnections    Talks on phone: Not on file    Gets together: Not on file    Attends religious service: Not on file    Active member of club or organization: Not on file    Attends meetings of clubs or organizations: Not on file    Relationship status: Not on file  Other Topics Concern  . Not on file  Social History Narrative  . Not on file   She does question FH of depression and HTN.  History reviewed. No pertinent surgical history.    Mardella LaymanHagler, Kevin Space, MD 04/21/19 [id number]    Mardella LaymanHagler, Yakub Lodes, MD 04/21/19 [phone]

## 2019-04-19 NOTE — Discharge Instructions (Signed)
Be aware, pain medications may cause drowsiness. Please do not drive, operate heavy machinery or make important decisions while on this medication, it can cloud your judgement.  

## 2019-04-19 NOTE — ED Triage Notes (Signed)
Patient presents to Urgent Care with complaints of right leg/groin pain, chronically. Patient reports she has been having slight numbness to the bottom of her left foot for the past several weeks as well.

## 2019-04-24 ENCOUNTER — Ambulatory Visit: Payer: Medicare HMO | Admitting: Sports Medicine

## 2019-06-06 ENCOUNTER — Encounter (HOSPITAL_COMMUNITY): Payer: Self-pay

## 2019-06-06 ENCOUNTER — Other Ambulatory Visit: Payer: Self-pay

## 2019-06-06 ENCOUNTER — Ambulatory Visit (HOSPITAL_COMMUNITY)
Admission: EM | Admit: 2019-06-06 | Discharge: 2019-06-06 | Disposition: A | Discharge status: Home / Self Care | Payer: Medicare HMO | Attending: Family Medicine | Admitting: Family Medicine

## 2019-06-06 DIAGNOSIS — I10 Essential (primary) hypertension: Secondary | ICD-10-CM | POA: Diagnosis not present

## 2019-06-06 DIAGNOSIS — F17210 Nicotine dependence, cigarettes, uncomplicated: Secondary | ICD-10-CM | POA: Insufficient documentation

## 2019-06-06 DIAGNOSIS — Z79899 Other long term (current) drug therapy: Secondary | ICD-10-CM | POA: Insufficient documentation

## 2019-06-06 DIAGNOSIS — R202 Paresthesia of skin: Secondary | ICD-10-CM | POA: Insufficient documentation

## 2019-06-06 DIAGNOSIS — M5431 Sciatica, right side: Secondary | ICD-10-CM | POA: Diagnosis not present

## 2019-06-06 DIAGNOSIS — R05 Cough: Secondary | ICD-10-CM | POA: Diagnosis not present

## 2019-06-06 DIAGNOSIS — Z1159 Encounter for screening for other viral diseases: Secondary | ICD-10-CM

## 2019-06-06 DIAGNOSIS — F419 Anxiety disorder, unspecified: Secondary | ICD-10-CM | POA: Diagnosis not present

## 2019-06-06 DIAGNOSIS — Z20828 Contact with and (suspected) exposure to other viral communicable diseases: Secondary | ICD-10-CM | POA: Insufficient documentation

## 2019-06-06 DIAGNOSIS — R200 Anesthesia of skin: Secondary | ICD-10-CM | POA: Insufficient documentation

## 2019-06-06 DIAGNOSIS — M79606 Pain in leg, unspecified: Secondary | ICD-10-CM | POA: Insufficient documentation

## 2019-06-06 DIAGNOSIS — F319 Bipolar disorder, unspecified: Secondary | ICD-10-CM | POA: Diagnosis not present

## 2019-06-06 MED ORDER — KETOROLAC TROMETHAMINE 60 MG/2ML IM SOLN
60.0000 mg | Freq: Once | INTRAMUSCULAR | Status: AC
  Administered 2019-06-06: 60 mg via INTRAMUSCULAR

## 2019-06-06 MED ORDER — PREDNISONE 10 MG (21) PO TBPK: ORAL_TABLET | ORAL | 0 refills | Status: DC

## 2019-06-06 MED ORDER — KETOROLAC TROMETHAMINE 60 MG/2ML IM SOLN
INTRAMUSCULAR | Status: AC
  Filled 2019-06-06: qty 2

## 2019-06-06 NOTE — ED Triage Notes (Signed)
Pt states she is having right leg pain x 1 year. Pt reports tingling and numbness in her right leg and numbness in her left foot x 4 moths.

## 2019-06-06 NOTE — ED Provider Notes (Signed)
[facility]    CSN: [id number] Arrival date & time: 06/06/19  1225      History   Chief Complaint Chief Complaint  Patient presents with  . Cough  . Leg Pain    HPI Gina Richards is a 58 y.o. female.   Pt is a 58 year old female that presents with chronic right leg pain, sciatic pain. She has been treated for this in the past. This episode has lasted a few days. Pain is on the right side with associated numbness and tingling. She has not been taking anything for the pain. No saddle paraesthesias or loss of bowel or bladder function. No fever or urinary symptoms.   ROS per HPI      Past Medical History:  Diagnosis Date  . Anxiety   . Bipolar 1 disorder (HCC)   . Depression   . Hypertension     Patient Active Problem List   Diagnosis Date Noted  . Cocaine abuse with cocaine-induced mood disorder (HCC) 07/10/2017  . Cannabis use disorder, severe, dependence (HCC) 07/10/2017    History reviewed. No pertinent surgical history.  OB History   No obstetric history on file.      Home Medications    Prior to Admission medications   Medication Sig Start Date End Date Taking? Authorizing Provider  escitalopram (LEXAPRO) 20 MG tablet Take 1 tablet (20 mg total) by mouth daily. 04/19/19   Mardella LaymanHagler, Brian, MD  lisinopril (ZESTRIL) 5 MG tablet Take 1 tablet (5 mg total) by mouth daily. 04/19/19   Mardella LaymanHagler, Brian, MD  naproxen (NAPROSYN) 375 MG tablet Take 375 mg by mouth 2 (two) times daily as needed. for pain 02/17/19   [provider]  predniSONE (STERAPRED UNI-PAK 21 TAB) 10 MG (21) TBPK tablet 6 tabs for 1 day, then 5 tabs for 1 das, then 4 tabs for 1 day, then 3 tabs for 1 day, 2 tabs for 1 day, then 1 tab for 1 day 06/06/19   Dahlia ByesBast, Kayleena Eke A, NP  PROAIR HFA 108 (90 Base) MCG/ACT inhaler INHALE 1 TO 2 PUFFS BY MOUTH  EVERY 4 TO 6 HOURS AS NEEDED FOR SOB/WHEEZING 02/15/17   [provider]  traMADol (ULTRAM) 50 MG tablet Take 1 tablet (50 mg  total) by mouth every 6 (six) hours as needed. 04/19/19   Mardella LaymanHagler, Brian, MD  promethazine (PHENERGAN) 25 MG tablet Take 1 tablet (25 mg total) by mouth every 8 (eight) hours as needed for nausea or vomiting. Patient not taking: Reported on 05/10/2015 11/10/14 06/15/15  Charlestine NightLawyer, Christopher, PA-C  QUEtiapine (SEROQUEL) 300 MG tablet Take 300 mg by mouth at bedtime. 03/13/17 04/19/19  [provider]  traZODone (DESYREL) 50 MG tablet Take 50 mg by mouth at bedtime. 03/13/17 04/19/19  [provider]    Family History Family History  Family history unknown: Yes    Social History Social History   Tobacco Use  . Smoking status: Current Every Day Smoker    Packs/day: 1.50    Types: Cigarettes  . Smokeless tobacco: Never Used  Substance Use Topics  . Alcohol use: Yes    Comment: One beer but she will not inform how often.   . Drug use: No     Allergies   Patient has no known allergies.   Review of Systems Review of Systems   Physical Exam Triage Vital Signs ED Triage Vitals  Enc Vitals Group     BP 06/06/19 1317 (!) 140/95  Pulse Rate 06/06/19 1317 62     Resp 06/06/19 1317 16     Temp 06/06/19 1317 (!) 97.5 F (36.4 C)     Temp Source 06/06/19 1317 Temporal     SpO2 06/06/19 1317 99 %     Weight --      Height --      Head Circumference --      Peak Flow --      Pain Score 06/06/19 1315 8     Pain Loc --      Pain Edu? --      Excl. in [place]? --    No data found.  Updated Vital Signs BP (!) 140/95 (BP Location: Left Arm)   Pulse 62   Temp (!) 97.5 F (36.4 C) (Temporal)   Resp 16   SpO2 99%   Visual Acuity Right Eye Distance:   Left Eye Distance:   Bilateral Distance:    Right Eye Near:   Left Eye Near:    Bilateral Near:     Physical Exam Vitals signs and nursing note reviewed.  Constitutional:      General: She is not in acute distress.    Appearance: Normal appearance. She is not ill-appearing, toxic-appearing or diaphoretic.   HENT:     Head: Normocephalic.     Nose: Nose normal.     Mouth/Throat:     Pharynx: Oropharynx is clear.  Eyes:     Conjunctiva/sclera: Conjunctivae normal.  Neck:     Musculoskeletal: Normal range of motion.  Pulmonary:     Effort: Pulmonary effort is normal.  Musculoskeletal: Normal range of motion.     Comments: TTP right lower lumbar paravertebral muscles.  Positive right straight leg raise.   Skin:    General: Skin is warm and dry.     Findings: No rash.  Neurological:     Mental Status: She is alert.  Psychiatric:        Mood and Affect: Mood normal.      UC Treatments / Results  Labs (all labs ordered are listed, but only abnormal results are displayed) Labs Reviewed  NOVEL CORONAVIRUS, NAA (HOSP ORDER, SEND-OUT TO REF LAB; TAT 18-24 HRS)    EKG   Radiology No results found.  Procedures Procedures (including critical care time)  Medications Ordered in UC Medications  ketorolac (TORADOL) injection 60 mg (60 mg Intramuscular Given 06/06/19 1407)  ketorolac (TORADOL) 60 MG/2ML injection (has no administration in time range)    Initial Impression / Assessment and Plan / UC Course  I have reviewed the triage vital signs and the nursing notes.  Pertinent labs & imaging results that were available during my care of the patient were reviewed by me and considered in my medical decision making (see chart for details).     Right side sciatic nerve pain- treated with prednisone taper. Toradol given here in the clinic.  No red flags.  Follow up as needed for continued or worsening symptoms  Final Clinical Impressions(s) / UC Diagnoses   Final diagnoses:  Sciatica of right side     Discharge Instructions     Treating you for sciatic nerve pain. Toradol injection given here in clinic.  Sending you home with prednisone taper to take over the next 6 days with food. Follow up as needed for continued or worsening symptoms      ED Prescriptions     Medication Sig Dispense Auth. Provider   predniSONE (STERAPRED UNI-PAK 21 TAB) 10  MG (21) TBPK tablet 6 tabs for 1 day, then 5 tabs for 1 das, then 4 tabs for 1 day, then 3 tabs for 1 day, 2 tabs for 1 day, then 1 tab for 1 day 21 tablet Aysa Larivee A, NP     I have reviewed the PDMP during this encounter.   Janace Aris, NP 06/09/19 [phone]

## 2019-06-06 NOTE — Discharge Instructions (Addendum)
Treating you for sciatic nerve pain. Toradol injection given here in clinic.  Sending you home with prednisone taper to take over the next 6 days with food. Follow up as needed for continued or worsening symptoms

## 2019-06-08 LAB — NOVEL CORONAVIRUS, NAA (HOSP ORDER, SEND-OUT TO REF LAB; TAT 18-24 HRS): SARS-CoV-2, NAA: NOT DETECTED

## 2019-10-31 ENCOUNTER — Other Ambulatory Visit: Payer: Self-pay | Admitting: Family Medicine

## 2019-10-31 DIAGNOSIS — Z1231 Encounter for screening mammogram for malignant neoplasm of breast: Secondary | ICD-10-CM

## 2019-11-17 ENCOUNTER — Ambulatory Visit
Admission: RE | Admit: 2019-11-17 | Discharge: 2019-11-17 | Disposition: A | Discharge status: Home / Self Care | Payer: Medicare HMO | Source: Ambulatory Visit | Attending: Family Medicine | Admitting: Family Medicine

## 2019-11-17 ENCOUNTER — Other Ambulatory Visit: Payer: Self-pay | Admitting: Family Medicine

## 2019-11-17 DIAGNOSIS — F17210 Nicotine dependence, cigarettes, uncomplicated: Secondary | ICD-10-CM

## 2019-11-17 DIAGNOSIS — G8929 Other chronic pain: Secondary | ICD-10-CM

## 2019-11-18 ENCOUNTER — Ambulatory Visit
Admission: RE | Admit: 2019-11-18 | Discharge: 2019-11-18 | Disposition: A | Discharge status: Home / Self Care | Payer: Medicare HMO | Source: Ambulatory Visit | Attending: Family Medicine | Admitting: Family Medicine

## 2019-11-18 ENCOUNTER — Other Ambulatory Visit: Payer: Self-pay | Admitting: Family Medicine

## 2019-11-18 DIAGNOSIS — R52 Pain, unspecified: Secondary | ICD-10-CM

## 2019-11-28 ENCOUNTER — Ambulatory Visit: Payer: Medicare HMO

## 2019-12-16 ENCOUNTER — Other Ambulatory Visit: Payer: Self-pay

## 2019-12-16 ENCOUNTER — Ambulatory Visit (HOSPITAL_COMMUNITY)
Admission: EM | Admit: 2019-12-16 | Discharge: 2019-12-16 | Disposition: A | Discharge status: Home / Self Care | Payer: Medicare HMO | Attending: Family Medicine | Admitting: Family Medicine

## 2019-12-16 ENCOUNTER — Encounter (HOSPITAL_COMMUNITY): Payer: Self-pay

## 2019-12-16 DIAGNOSIS — M5431 Sciatica, right side: Secondary | ICD-10-CM

## 2019-12-16 MED ORDER — METHYLPREDNISOLONE 4 MG PO TBPK: ORAL_TABLET | ORAL | 0 refills | Status: DC

## 2019-12-16 MED ORDER — TIZANIDINE HCL 4 MG PO TABS: 4.0000 mg | ORAL_TABLET | Freq: Four times a day (QID) | ORAL | 0 refills | Status: DC | PRN

## 2019-12-16 MED ORDER — KETOROLAC TROMETHAMINE 60 MG/2ML IM SOLN
INTRAMUSCULAR | Status: AC
  Filled 2019-12-16: qty 2

## 2019-12-16 MED ORDER — KETOROLAC TROMETHAMINE 60 MG/2ML IM SOLN
60.0000 mg | Freq: Once | INTRAMUSCULAR | Status: AC
  Administered 2019-12-16: 60 mg via INTRAMUSCULAR

## 2019-12-16 NOTE — ED Triage Notes (Signed)
Patient reports right sided lower back, hip, and leg pain.

## 2019-12-16 NOTE — Discharge Instructions (Signed)
Call  the spine doctor for follow up

## 2019-12-16 NOTE — ED Provider Notes (Signed)
[facility]    CSN: [id number] Arrival date & time: 12/16/19  1148      History   Chief Complaint Chief Complaint  Patient presents with  . Leg Pain  . Hip Pain  . Back Pain    HPI Gina Richards is a 59 y.o. female.   HPI  Patient has chronic back pain with a right sciatica complaint She often receives a shot of Toradol and a course of steroids She states that this is helpful for her She does not have pain medication, takes over-the-counter medicines at home She states that she has not seen a spine specialist She has not had any recent x-rays She states she does not think she has had any imaging with a CAT scan or MRI She does not have a primary care doctor Currently she has right-sided low back radiating to her hip and leg.  She is here in a wheelchair.  She states the pain is "severe".  Its been building for the last 3 days.  No new accident or injury  Past Medical History:  Diagnosis Date  . Anxiety   . Bipolar 1 disorder (HCC)   . Depression   . Hypertension     Patient Active Problem List   Diagnosis Date Noted  . Cocaine abuse with cocaine-induced mood disorder (HCC) 07/10/2017  . Cannabis use disorder, severe, dependence (HCC) 07/10/2017    History reviewed. No pertinent surgical history.  OB History   No obstetric history on file.      Home Medications    Prior to Admission medications   Medication Sig Start Date End Date Taking? Authorizing Provider  escitalopram (LEXAPRO) 20 MG tablet Take 1 tablet (20 mg total) by mouth daily. 04/19/19   Mardella Layman, MD  lisinopril (ZESTRIL) 5 MG tablet Take 1 tablet (5 mg total) by mouth daily. 04/19/19   Mardella Layman, MD  methylPREDNISolone (MEDROL DOSEPAK) 4 MG TBPK tablet tad 12/16/19   Eustace Moore, MD  naproxen (NAPROSYN) 375 MG tablet Take 375 mg by mouth 2 (two) times daily as needed. for pain 02/17/19   [provider]  predniSONE (STERAPRED UNI-PAK 21 TAB) 10 MG (21) TBPK  tablet 6 tabs for 1 day, then 5 tabs for 1 das, then 4 tabs for 1 day, then 3 tabs for 1 day, 2 tabs for 1 day, then 1 tab for 1 day 06/06/19   Dahlia Byes A, NP  PROAIR HFA 108 (90 Base) MCG/ACT inhaler INHALE 1 TO 2 PUFFS BY MOUTH  EVERY 4 TO 6 HOURS AS NEEDED FOR SOB/WHEEZING 02/15/17   [provider]  tiZANidine (ZANAFLEX) 4 MG tablet Take 1-2 tablets (4-8 mg total) by mouth every 6 (six) hours as needed for muscle spasms. 12/16/19   Eustace Moore, MD  traMADol (ULTRAM) 50 MG tablet Take 1 tablet (50 mg total) by mouth every 6 (six) hours as needed. 04/19/19   Mardella Layman, MD  promethazine (PHENERGAN) 25 MG tablet Take 1 tablet (25 mg total) by mouth every 8 (eight) hours as needed for nausea or vomiting. Patient not taking: Reported on 05/10/2015 11/10/14 06/15/15  Charlestine Night, PA-C  QUEtiapine (SEROQUEL) 300 MG tablet Take 300 mg by mouth at bedtime. 03/13/17 04/19/19  [provider]  traZODone (DESYREL) 50 MG tablet Take 50 mg by mouth at bedtime. 03/13/17 04/19/19  [provider]    Family History Family History  Family history unknown: Yes    Social History Social History  Tobacco Use  . Smoking status: Current Every Day Smoker    Packs/day: 0.50    Types: Cigarettes  . Smokeless tobacco: Never Used  Substance Use Topics  . Alcohol use: Yes    Comment: One beer but she will not inform how often.   . Drug use: No     Allergies   Patient has no known allergies.   Review of Systems Review of Systems  Musculoskeletal: Positive for back pain and gait problem.    Physical Exam Triage Vital Signs ED Triage Vitals  Enc Vitals Group     BP 12/16/19 1208 106/71     Pulse Rate 12/16/19 1208 93     Resp 12/16/19 1208 14     Temp 12/16/19 1208 98.6 F (37 C)     Temp Source 12/16/19 1208 Oral     SpO2 12/16/19 1208 98 %     Weight --      Height --      Head Circumference --      Peak Flow --      Pain Score 12/16/19 1206 9      Pain Loc --      Pain Edu? --      Excl. in [place]? --    No data found.  Updated Vital Signs BP 106/71 (BP Location: Right Arm)   Pulse 93   Temp 98.6 F (37 C) (Oral)   Resp 14   SpO2 98%      Physical Exam Constitutional:      General: She is not in acute distress.    Appearance: She is well-developed and normal weight.     Comments: Patient is in wheelchair  HENT:     Head: Normocephalic and atraumatic.     Mouth/Throat:     Comments: Mask is in place Eyes:     Conjunctiva/sclera: Conjunctivae normal.     Pupils: Pupils are equal, round, and reactive to light.  Cardiovascular:     Rate and Rhythm: Normal rate.  Pulmonary:     Effort: Pulmonary effort is normal. No respiratory distress.  Musculoskeletal:        General: Normal range of motion.     Cervical back: Normal range of motion.     Comments: No tenderness to palpation.  No muscle spasm palpated.  Patient has limited movement secondary to pain and declines getting out of the wheelchair.  She does have a positive straight leg raise on the right.  No sensory deficit.  No reflex deficits  Skin:    General: Skin is warm and dry.  Neurological:     Mental Status: She is alert.      UC Treatments / Results  Labs (all labs ordered are listed, but only abnormal results are displayed) Labs Reviewed - No data to display  EKG   Radiology No results found.  Procedures Procedures (including critical care time)  Medications Ordered in UC Medications  ketorolac (TORADOL) injection 60 mg (60 mg Intramuscular Given 12/16/19 1253)    Initial Impression / Assessment and Plan / UC Course  I have reviewed the triage vital signs and the nursing notes.  Pertinent labs & imaging results that were available during my care of the patient were reviewed by me and considered in my medical decision making (see chart for details).     Recommend follow-up with spine specialty Final Clinical Impressions(s) / UC Diagnoses    Final diagnoses:  Sciatica of right side  Discharge Instructions     Call  the spine doctor for follow up   ED Prescriptions    Medication Sig Dispense Auth. Provider   methylPREDNISolone (MEDROL DOSEPAK) 4 MG TBPK tablet tad 21 tablet Raylene Everts, MD   tiZANidine (ZANAFLEX) 4 MG tablet Take 1-2 tablets (4-8 mg total) by mouth every 6 (six) hours as needed for muscle spasms. 21 tablet Raylene Everts, MD     PDMP not reviewed this encounter.   Raylene Everts, MD 12/16/19 [phone]

## 2020-01-10 ENCOUNTER — Other Ambulatory Visit: Payer: Self-pay

## 2020-01-10 ENCOUNTER — Ambulatory Visit (HOSPITAL_COMMUNITY)
Admission: EM | Admit: 2020-01-10 | Discharge: 2020-01-10 | Disposition: A | Discharge status: Home / Self Care | Payer: Medicare HMO | Attending: Family Medicine | Admitting: Family Medicine

## 2020-01-10 ENCOUNTER — Encounter (HOSPITAL_COMMUNITY): Payer: Self-pay | Admitting: Gynecology

## 2020-01-10 DIAGNOSIS — M5416 Radiculopathy, lumbar region: Secondary | ICD-10-CM | POA: Diagnosis not present

## 2020-01-10 MED ORDER — KETOROLAC TROMETHAMINE 60 MG/2ML IM SOLN
INTRAMUSCULAR | Status: AC
  Filled 2020-01-10: qty 2

## 2020-01-10 MED ORDER — KETOROLAC TROMETHAMINE 60 MG/2ML IM SOLN
60.0000 mg | Freq: Once | INTRAMUSCULAR | Status: AC
  Administered 2020-01-10: 60 mg via INTRAMUSCULAR

## 2020-01-10 MED ORDER — PREDNISONE 5 MG PO TABS: ORAL_TABLET | ORAL | 0 refills | Status: DC

## 2020-01-10 NOTE — ED Provider Notes (Signed)
[facility]    CSN: [id number] Arrival date & time: 01/10/20  1138      History   Chief Complaint No chief complaint on file.   HPI Gina Richards is a 59 y.o. female.   She is presenting with acute on chronic right leg pain.  Pain is extending from the right hip down the lateral aspect of the leg.  She has had similar pain in the past.  She has been starting a new job where she has to stand most all day.  Pain is intermittent in nature.  Seems worse if she lies on the affected side.    HPI  Past Medical History:  Diagnosis Date  . Anxiety   . Bipolar 1 disorder (HCC)   . Depression   . Hypertension     Patient Active Problem List   Diagnosis Date Noted  . Cocaine abuse with cocaine-induced mood disorder (HCC) 07/10/2017  . Cannabis use disorder, severe, dependence (HCC) 07/10/2017    History reviewed. No pertinent surgical history.  OB History   No obstetric history on file.      Home Medications    Prior to Admission medications   Medication Sig Start Date End Date Taking? Authorizing Provider  lisinopril (ZESTRIL) 5 MG tablet Take 1 tablet (5 mg total) by mouth daily. 04/19/19  Yes Mardella Layman, MD  tiZANidine (ZANAFLEX) 4 MG tablet Take 1-2 tablets (4-8 mg total) by mouth every 6 (six) hours as needed for muscle spasms. 12/16/19  Yes Eustace Moore, MD  predniSONE (DELTASONE) 5 MG tablet Take 6 pills for first day, 5 pills second day, 4 pills third day, 3 pills fourth day, 2 pills the fifth day, and 1 pill sixth day. 01/10/20   Myra Rude, MD  PROAIR HFA 108 [phone] Base) MCG/ACT inhaler INHALE 1 TO 2 PUFFS BY MOUTH  EVERY 4 TO 6 HOURS AS NEEDED FOR SOB/WHEEZING 02/15/17   [provider]  escitalopram (LEXAPRO) 20 MG tablet Take 1 tablet (20 mg total) by mouth daily. 04/19/19 01/10/20  Mardella Layman, MD  promethazine (PHENERGAN) 25 MG tablet Take 1 tablet (25 mg total) by mouth every 8 (eight) hours as needed for nausea or  vomiting. Patient not taking: Reported on 05/10/2015 11/10/14 06/15/15  Charlestine Night, PA-C  QUEtiapine (SEROQUEL) 300 MG tablet Take 300 mg by mouth at bedtime. 03/13/17 04/19/19  [provider]  traZODone (DESYREL) 50 MG tablet Take 50 mg by mouth at bedtime. 03/13/17 04/19/19  [provider]    Family History Family History  Family history unknown: Yes    Social History Social History   Tobacco Use  . Smoking status: Current Every Day Smoker    Packs/day: 0.50    Types: Cigarettes  . Smokeless tobacco: Never Used  Substance Use Topics  . Alcohol use: Yes    Comment: One beer but she will not inform how often.   . Drug use: No     Allergies   Patient has no known allergies.   Review of Systems Review of Systems  See HPI  Physical Exam Triage Vital Signs ED Triage Vitals  Enc Vitals Group     BP 01/10/20 1246 (!) 142/93     Pulse Rate 01/10/20 1246 72     Resp 01/10/20 1246 16     Temp 01/10/20 1246 98.4 F (36.9 C)     Temp Source 01/10/20 1246 Oral     SpO2 --  Weight 01/10/20 1247 158 lb (71.7 kg)     Height 01/10/20 1247 5\' 2"  (1.575 m)     Head Circumference --      Peak Flow --      Pain Score 01/10/20 1247 10     Pain Loc --      Pain Edu? --      Excl. in [place]? --    No data found.  Updated Vital Signs BP (!) 142/93 (BP Location: Left Arm)   Pulse 72   Temp 98.4 F (36.9 C) (Oral)   Resp 16   Ht 5\' 2"  (1.575 m)   Wt 71.7 kg   BMI 28.90 kg/m   Visual Acuity Right Eye Distance:   Left Eye Distance:   Bilateral Distance:    Right Eye Near:   Left Eye Near:    Bilateral Near:     Physical Exam Gen: NAD, alert, cooperative with exam, well-appearing ENT: normal lips, normal nasal mucosa,  Eye: normal EOM, normal conjunctiva and lids Skin: no rashes, no areas of induration  Neuro: normal tone, normal sensation to touch Psych:  normal insight, alert and oriented MSK:  Back/right hip: Normal internal and  external rotation of the hip. Normal strength resistance with hip flexion. Normal plantarflexion and dorsiflexion. Negative straight leg raise. No signs of atrophy. Symmetric reflex at Achilles bilaterally. Neurovascular intact   UC Treatments / Results  Labs (all labs ordered are listed, but only abnormal results are displayed) Labs Reviewed - No data to display  EKG   Radiology No results found.  Procedures Procedures (including critical care time)  Medications Ordered in UC Medications  ketorolac (TORADOL) injection 60 mg (60 mg Intramuscular Given 01/10/20 1315)    Initial Impression / Assessment and Plan / UC Course  I have reviewed the triage vital signs and the nursing notes.  Pertinent labs & imaging results that were available during my care of the patient were reviewed by me and considered in my medical decision making (see chart for details).     Gina Richards is presenting with symptoms suggestive of lumbar radiculopathy.  Provided Toradol.  Given a course of prednisone.  Advised follow-up for the consideration of further imaging or physical therapy.  Given occasions to follow-up return.  Final Clinical Impressions(s) / UC Diagnoses   Final diagnoses:  Lumbar radiculopathy     Discharge Instructions     Please try the exercises  Please follow up with the office provided  Please try heat  Please follow up if your symptoms fail to improve.     ED Prescriptions    Medication Sig Dispense Auth. Provider   predniSONE (DELTASONE) 5 MG tablet Take 6 pills for first day, 5 pills second day, 4 pills third day, 3 pills fourth day, 2 pills the fifth day, and 1 pill sixth day. 21 tablet Rosemarie Ax, MD     PDMP not reviewed this encounter.   Rosemarie Ax, MD 01/10/20 1356

## 2020-01-10 NOTE — Discharge Instructions (Signed)
Please try the exercises  Please follow up with the office provided  Please try heat  Please follow up if your symptoms fail to improve.

## 2020-01-10 NOTE — ED Triage Notes (Signed)
Per patient with right hip/ leg pain x couple months. Pt. Stated that she was seen x 1 month ago, but pain is still worse.

## 2020-01-28 ENCOUNTER — Other Ambulatory Visit: Payer: Self-pay

## 2020-01-28 ENCOUNTER — Encounter: Payer: Self-pay | Admitting: Family

## 2020-01-28 ENCOUNTER — Encounter: Payer: Self-pay | Admitting: Physical Medicine and Rehabilitation

## 2020-01-28 ENCOUNTER — Ambulatory Visit: Payer: Self-pay

## 2020-01-28 ENCOUNTER — Ambulatory Visit (INDEPENDENT_AMBULATORY_CARE_PROVIDER_SITE_OTHER): Payer: Medicare HMO | Admitting: Family

## 2020-01-28 ENCOUNTER — Ambulatory Visit (INDEPENDENT_AMBULATORY_CARE_PROVIDER_SITE_OTHER): Payer: Medicare HMO | Admitting: Physical Medicine and Rehabilitation

## 2020-01-28 VITALS — Ht 62.0 in | Wt 158.0 lb

## 2020-01-28 VITALS — BP 150/93 | HR 62

## 2020-01-28 DIAGNOSIS — M25551 Pain in right hip: Secondary | ICD-10-CM

## 2020-01-28 DIAGNOSIS — M25559 Pain in unspecified hip: Secondary | ICD-10-CM | POA: Diagnosis not present

## 2020-01-28 DIAGNOSIS — M5416 Radiculopathy, lumbar region: Secondary | ICD-10-CM | POA: Diagnosis not present

## 2020-01-28 MED ORDER — TRIAMCINOLONE ACETONIDE 40 MG/ML IJ SUSP
60.0000 mg | INTRAMUSCULAR | Status: AC | PRN
  Administered 2020-01-28: 60 mg via INTRA_ARTICULAR

## 2020-01-28 MED ORDER — BUPIVACAINE HCL 0.25 % IJ SOLN
4.0000 mL | INTRAMUSCULAR | Status: AC | PRN
  Administered 2020-01-28: 4 mL via INTRA_ARTICULAR

## 2020-01-28 MED ORDER — METHYLPREDNISOLONE ACETATE 80 MG/ML IJ SUSP: 80.0000 mg | Freq: Once | INTRAMUSCULAR | Status: DC

## 2020-01-28 MED ORDER — HYDROCODONE-ACETAMINOPHEN 5-325 MG PO TABS: 1.0000 | ORAL_TABLET | Freq: Four times a day (QID) | ORAL | 0 refills | Status: DC | PRN

## 2020-01-28 NOTE — Progress Notes (Signed)
Office Visit Note   Patient: Gina Richards           Date of Birth: July 21, 1961           MRN: [id number] Visit Date: 01/28/2020              Requested by: No referring provider defined for this encounter. PCP: Patient, No Pcp Per  Chief Complaint  Patient presents with  . Right Hip - Pain, New Patient (Initial Visit)  . Right Knee - Pain      HPI: The patient is a 59 year old woman who presents today complaining of right lateral hip thigh and knee pain.  The pain occasionally radiates into her groin she has chronic back pain.  She complains that her pain feels stabbing shooting numbing tingling at times her leg is heavy with ambulation she has no positions of comfort she is unable to get comfortable to sleep.  Cannot lay on her side.    This has been persistent during her most recent episode.  She is complaining of 10 out of 10 pain.  Unfortunately this has been ongoing throughout the spring waxing and waning she has had several encounters with emergency department other providers for the same.  She has tried narcotics as well as prednisone without any relief.  She has not had any recent falls no recent injuries.  She has tried icy hot as well as a knee brace these are not helpful.  She feels to understand the origin of her pain.  It appears as though she has previously been diagnosed with lumbar radiculopathy.  Assessment & Plan: Visit Diagnoses:  1. Radiculopathy, lumbar region   2. Hip pain     Plan: Offered Pred burst.  Patient declined.  We have spoken with Dr. Alvester Morin and will get her in for same-day epidural steroid injection.  Follow-Up Instructions: No follow-ups on file.   Back Exam   Tenderness  The patient is experiencing tenderness in the sacroiliac.  Range of Motion  The patient has normal back ROM.  Tests  Straight leg raise right: positive Straight leg raise left: negative  Other  Gait: abnormal       Patient is alert, oriented, no adenopathy,  well-dressed, normal affect, normal respiratory effort.   Imaging: No results found. No images are attached to the encounter.  Labs: Lab Results  Component Value Date   REPTSTATUS 11/12/2014 FINAL 11/10/2014   CULT  11/10/2014    ESCHERICHIA COLI Performed at [facility]    [facility] ESCHERICHIA COLI 11/10/2014     Lab Results  Component Value Date   ALBUMIN 4.5 07/10/2017   ALBUMIN 3.1 (L) 11/10/2014    No results found for: MG No results found for: VD25OH  No results found for: PREALBUMIN CBC EXTENDED Latest Ref Rng & Units 07/10/2017 08/24/2016 07/05/2015  WBC 4.0 - 10.5 K/uL 6.4 6.0 4.9  RBC 3.87 - 5.11 MIL/uL 4.42 4.33 4.33  HGB 12.0 - 15.0 g/dL 34.1 93.7 90.2  HCT 40.9 - 46.0 % 41.7 40.9 42.3  PLT 150 - 400 K/uL 283 232 262  NEUTROABS 1.7 - 7.7 K/uL - - -  LYMPHSABS 0.7 - 4.0 K/uL - - -     Body mass index is 28.9 kg/m.  Orders:  Orders Placed This Encounter  Procedures  . XR HIP UNILAT W OR W/O PELVIS 2-3 VIEWS RIGHT  . Ambulatory referral to Physical Medicine Rehab   No orders of the defined types were placed  in this encounter.    Procedures: No procedures performed  Clinical Data: No additional findings.  ROS:  All other systems negative, except as noted in the HPI. Review of Systems  Constitutional: Negative for chills and fever.  Musculoskeletal: Positive for back pain.  Neurological: Positive for numbness. Negative for weakness.    Objective: Vital Signs: Ht 5\' 2"  (1.575 m)   Wt 158 lb (71.7 kg)   BMI 28.90 kg/m   Specialty Comments:  No specialty comments available.  PMFS History: Patient Active Problem List   Diagnosis Date Noted  . Cocaine abuse with cocaine-induced mood disorder ([place]) 07/10/2017  . Cannabis use disorder, severe, dependence ([place]) 07/10/2017   Past Medical History:  Diagnosis Date  . Anxiety   . Bipolar 1 disorder ([place])   . Depression   . Hypertension     Family History  Family history  unknown: Yes    History reviewed. No pertinent surgical history. Social History   Occupational History  . Not on file  Tobacco Use  . Smoking status: Current Every Day Smoker    Packs/day: 0.50    Types: Cigarettes  . Smokeless tobacco: Never Used  Substance and Sexual Activity  . Alcohol use: Yes    Comment: One beer but she will not inform how often.   . Drug use: No  . Sexual activity: Not on file

## 2020-01-28 NOTE — Progress Notes (Signed)
Gina Richards - 59 y.o. female MRN [id number]  Date of birth: 14-May-1961  Office Visit Note: Visit Date: 01/28/2020 PCP: Patient, No Pcp Per Referred by: No ref. provider found  Subjective: Chief Complaint  Patient presents with  . Lower Back - Pain  . Right Hip - Pain  . Right Thigh - Pain   HPI:  Gina Richards is a 59 y.o. female who comes in today for planned Right L5-S1 lumbar epidural steroid injection with fluoroscopic guidance.  The patient has failed conservative care including home exercise, medications, time and activity modification.  This injection will be diagnostic and hopefully therapeutic.  Please see requesting physician notes for further details and justification.  I was able to work the patient in today for a semiurgent epidural injection.  Once I did see the patient and evaluate her independently she is having what I feel like is pretty classic pain referred in the right hip joint.  She has to actually pick her leg up to get in and out of the car.  She has pain with internal rotation if you flex and move the hip.  She gets pain referred from the groin into the knee area nothing really past the knee.  She is not telling me any paresthesias.  She has no left-sided complaints.  I reviewed radiographs of the lumbar spine from this year and she does have degenerative disc height loss at L5-S1 with facet arthropathy but no listhesis noted.  She has no MRI of the lumbar spine.  X-ray imaging of the right hip does show extensive arthritis of the right hip.   ROS Otherwise per HPI.  Assessment & Plan: Visit Diagnoses:  1. Lumbar radiculopathy     Plan: Findings:  Diagnostically she was better and had pretty significant pain relief initially getting up and down out of the chair after the diagnostic hip injection.  If she does not get any more relief than that she can always call us back and I would do an epidural injection this was discussed with her.    Meds & Orders:   Meds ordered this encounter  Medications  . DISCONTD: methylPREDNISolone acetate (DEPO-MEDROL) injection 80 mg    Orders Placed This Encounter  Procedures  . Large Joint Inj  . XR C-ARM NO REPORT    Follow-up: No follow-ups on file.   Procedures: Large Joint Inj: R hip joint on 01/28/2020 2:25 PM Indications: pain and diagnostic evaluation Details: 22 G needle, anterior approach  Arthrogram: Yes  Medications: 4 mL bupivacaine 0.25 %; 60 mg triamcinolone acetonide 40 MG/ML Outcome: tolerated well, no immediate complications  Arthrogram demonstrated excellent flow of contrast throughout the joint surface without extravasation or obvious defect.  The patient had relief of symptoms during the anesthetic phase of the injection.  Procedure, treatment alternatives, risks and benefits explained, specific risks discussed. Consent was given by the patient. Immediately prior to procedure a time out was called to verify the correct patient, procedure, equipment, support staff and site/side marked as required. Patient was prepped and draped in the usual sterile fashion.      No notes on file   Clinical History: No specialty comments available.     Objective:  VS:  HT:    WT:   BMI:     BP:(!) 150/93  HR:62bpm  TEMP: ( )  RESP:  Physical Exam Constitutional:      General: She is not in acute distress.    Appearance: Normal appearance.  She is not ill-appearing.  HENT:     Head: Normocephalic and atraumatic.     Right Ear: External ear normal.     Left Ear: External ear normal.  Eyes:     Extraocular Movements: Extraocular movements intact.  Cardiovascular:     Rate and Rhythm: Normal rate.     Pulses: Normal pulses.  Musculoskeletal:     Right lower leg: No edema.     Left lower leg: No edema.     Comments: Patient has good distal strength.  She is sitting in wheelchair to pain.  We had her stand up she is very slow to rise from a seated position with antalgic pain to the  right when standing.  She sits on the exam table and has to move her right leg by picking her thigh up and down with her right hand.  Initial internal rotation on the right is not as painful but with forward flexion and internal rotation she does get concordant pain in the anterior part of the hip and thigh.  She has some pain over the greater trochanter.  Skin:    Findings: No erythema, lesion or rash.  Neurological:     General: No focal deficit present.     Mental Status: She is alert and oriented to person, place, and time.     Sensory: No sensory deficit.     Motor: No weakness or abnormal muscle tone.     Coordination: Coordination normal.  Psychiatric:        Mood and Affect: Mood normal.        Behavior: Behavior normal.      Imaging: No results found.

## 2020-01-28 NOTE — Progress Notes (Signed)
Pt states pain in the lower back on the right side that radiates into the right buttocks, right hip (groin pain), and right thigh. Pt states pain started less than 6 months ago. Pain medication helps with some pain. Standing for a long period of time makes pain worse.   .Numeric Pain Rating Scale and Functional Assessment Average Pain 10   In the last MONTH (on 0-10 scale) has pain interfered with the following?  1. General activity like being  able to carry out your everyday physical activities such as walking, climbing stairs, carrying groceries, or moving a chair?  Rating(10)   +Driver, -BT, -Dye Allergies.

## 2020-02-04 ENCOUNTER — Encounter (HOSPITAL_COMMUNITY): Payer: Self-pay | Admitting: Emergency Medicine

## 2020-02-04 ENCOUNTER — Ambulatory Visit (INDEPENDENT_AMBULATORY_CARE_PROVIDER_SITE_OTHER): Payer: Medicare HMO

## 2020-02-04 ENCOUNTER — Ambulatory Visit (HOSPITAL_COMMUNITY)
Admission: EM | Admit: 2020-02-04 | Discharge: 2020-02-04 | Disposition: A | Discharge status: Home / Self Care | Payer: Medicare HMO | Attending: Emergency Medicine | Admitting: Emergency Medicine

## 2020-02-04 ENCOUNTER — Other Ambulatory Visit: Payer: Self-pay

## 2020-02-04 DIAGNOSIS — Z20822 Contact with and (suspected) exposure to covid-19: Secondary | ICD-10-CM | POA: Diagnosis not present

## 2020-02-04 DIAGNOSIS — F17210 Nicotine dependence, cigarettes, uncomplicated: Secondary | ICD-10-CM | POA: Insufficient documentation

## 2020-02-04 DIAGNOSIS — R05 Cough: Secondary | ICD-10-CM | POA: Insufficient documentation

## 2020-02-04 DIAGNOSIS — Z79899 Other long term (current) drug therapy: Secondary | ICD-10-CM | POA: Insufficient documentation

## 2020-02-04 DIAGNOSIS — I10 Essential (primary) hypertension: Secondary | ICD-10-CM | POA: Insufficient documentation

## 2020-02-04 DIAGNOSIS — R059 Cough, unspecified: Secondary | ICD-10-CM

## 2020-02-04 DIAGNOSIS — R0602 Shortness of breath: Secondary | ICD-10-CM | POA: Insufficient documentation

## 2020-02-04 DIAGNOSIS — F319 Bipolar disorder, unspecified: Secondary | ICD-10-CM | POA: Insufficient documentation

## 2020-02-04 DIAGNOSIS — K920 Hematemesis: Secondary | ICD-10-CM | POA: Insufficient documentation

## 2020-02-04 MED ORDER — PANTOPRAZOLE SODIUM 40 MG PO TBEC: 40.0000 mg | DELAYED_RELEASE_TABLET | Freq: Every day | ORAL | 0 refills | Status: DC

## 2020-02-04 MED ORDER — FAMOTIDINE 20 MG PO TABS: 20.0000 mg | ORAL_TABLET | Freq: Two times a day (BID) | ORAL | 0 refills | Status: DC

## 2020-02-04 NOTE — ED Triage Notes (Signed)
Patient started feeling bad when she got up this morning.  .  Patient reports vomiting 2 times today with "gobs" of blood in vomit.  Reports blood was red.  Denies diarrhea.  Patient complains of headaches.  Patient does not know if she has had a fever.  Patient points to top of stomach as painful area

## 2020-02-04 NOTE — ED Provider Notes (Signed)
HPI  SUBJECTIVE:  Gina Richards is a 59 y.o. female who presents with two episodes of a deep cough followed by "vomiting" bright red blood with clots and mucus.  Patient states that this is what she coughed up.  Patient denies posttussive emesis.  She reports nausea, dizziness and epigastric intermittent nonmigratory nonradiating abdominal pain that lasts hours.  She has not tried anything for symptoms.  There are no other aggravating or alleviating factors.  She has had this cough for the past 2 to 3 months.  No other vomiting.  No fevers, chest pain, shortness of breath, wheezing, epistaxis, vaginal bleeding, melena, hematochezia, hematuria.  She has never had symptoms like this before.  Denies NSAID use.  She is not on any anticoagulants or antiplatelets.  She reports occasional alcohol use.  No cocaine use in the past 6 months.  Reports occasional marijuana use.  She has a history of hypertension.  Patient denies smoking tobacco.  No history of asthma, emphysema, COPD, peptic ulcer disease, alcohol abuse, diabetes, cancer, coagulopathy, esophageal varices, Mallory-Weiss tear, cirrhosis.  PMD: None.  Past Medical History:  Diagnosis Date  . Anxiety   . Bipolar 1 disorder (HCC)   . Depression   . Hypertension     History reviewed. No pertinent surgical history.  Family History  Family history unknown: Yes    Social History   Tobacco Use  . Smoking status: Current Every Day Smoker    Packs/day: 0.50    Types: Cigarettes  . Smokeless tobacco: Never Used  Vaping Use  . Vaping Use: Never used  Substance Use Topics  . Alcohol use: Yes    Comment: One beer but she will not inform how often.   . Drug use: No    No current facility-administered medications for this encounter.  Current Outpatient Medications:  .  famotidine (PEPCID) 20 MG tablet, Take 1 tablet (20 mg total) by mouth 2 (two) times daily., Disp: 40 tablet, Rfl: 0 .  lisinopril (ZESTRIL) 5 MG tablet, Take 1 tablet (5  mg total) by mouth daily., Disp: 30 tablet, Rfl: 2 .  pantoprazole (PROTONIX) 40 MG tablet, Take 1 tablet (40 mg total) by mouth daily., Disp: 20 tablet, Rfl: 0 .  PROAIR HFA 108 (90 Base) MCG/ACT inhaler, INHALE 1 TO 2 PUFFS BY MOUTH  EVERY 4 TO 6 HOURS AS NEEDED FOR SOB/WHEEZING, Disp: , Rfl: 0  No Known Allergies   ROS  As noted in HPI.   Physical Exam  BP 114/88 (BP Location: Right Arm)   Pulse 89   Temp 98.2 F (36.8 C) (Oral)   Resp 20   SpO2 97%   Constitutional: Well developed, well nourished, no acute distress Eyes:  EOMI, conjunctiva normal bilaterally HENT: Normocephalic, atraumatic,mucus membranes moist Respiratory: Normal inspiratory effort. Questionable rhonchi right lower lobe posteriorly.  Otherwise good air movement. Cardiovascular: Normal rate regular rhythm no murmurs rubs or gallops  GI: nondistended soft.  Positive mild epigastric tenderness with deep palpation.  No other abdominal tenderness.  No guarding, rebound.  Active bowel sounds. skin: No rash, skin intact Musculoskeletal: no deformities Neurologic: Alert & oriented x 3, no focal neuro deficits Psychiatric: Speech and behavior appropriate   ED Course   Medications - No data to display  Orders Placed This Encounter  Procedures  . SARS CORONAVIRUS 2 (TAT 6-24 HRS) Nasopharyngeal Nasopharyngeal Swab    Standing Status:   Standing    Number of Occurrences:   1    Order  Specific Question:   Is this test for diagnosis or screening    Answer:   Screening    Order Specific Question:   Symptomatic for COVID-19 as defined by CDC    Answer:   No    Order Specific Question:   Hospitalized for COVID-19    Answer:   No    Order Specific Question:   Admitted to ICU for COVID-19    Answer:   No    Order Specific Question:   Previously tested for COVID-19    Answer:   Yes    Order Specific Question:   Resident in a congregate (group) care setting    Answer:   No    Order Specific Question:   Employed  in healthcare setting    Answer:   No    Order Specific Question:   Pregnant    Answer:   No    Order Specific Question:   Has patient completed COVID vaccination(s) (2 doses of Pfizer/Moderna 1 dose of [employer])    Answer:   No  . DG Chest 2 View    Standing Status:   Standing    Number of Occurrences:   1    Order Specific Question:   Reason for Exam (SYMPTOM  OR DIAGNOSIS REQUIRED)    Answer:   hemoptysis vs hemetemisis r/o PNA bronchitis CA pneumonitis  . Ambulatory referral to Gastroenterology    Referral Priority:   Urgent    Referral Type:   Consultation    Referral Reason:   Specialty Services Required    Number of Visits Requested:   1  . Ambulatory referral to Internal Medicine    Referral Priority:   Routine    Referral Type:   Consultation    Referral Reason:   Specialty Services Required    Requested Specialty:   Internal Medicine    Number of Visits Requested:   1    Results for orders placed or performed during the hospital encounter of 02/04/20 (from the past 24 hour(s))  SARS CORONAVIRUS 2 (TAT 6-24 HRS) Nasopharyngeal Nasopharyngeal Swab     Status: None   Collection Time: 02/04/20  3:54 PM   Specimen: Nasopharyngeal Swab  Result Value Ref Range   SARS Coronavirus 2 NEGATIVE NEGATIVE   DG Chest 2 View  Result Date: 02/04/2020 CLINICAL DATA:  Hemoptysis versus hematemesis. Evaluate for pneumonia. Shortness of breath. EXAM: CHEST - 2 VIEW COMPARISON:  Radiographs 11/17/2019 FINDINGS: The heart size and mediastinal contours are stable. The heart size is at the upper limits of normal, and there is aortic atherosclerosis. The lungs are clear. There is no pleural effusion or pneumothorax. Stable degenerative changes within the spine. IMPRESSION: Stable chest. No active cardiopulmonary process. Electronically Signed   By: Carey Bullocks M.D.   On: 02/04/2020 16:53    ED Clinical Impression  1. Hematemesis, presence of nausea not specified   2. Cough       ED Assessment/Plan  Vitals are normal.  She has questionable rhonchi in the right posterior lower lobe.  It sounds as if this is more of a hemoptysis rather than hematemesis.  Will check chest x-ray to evaluate for pneumonia, malignancy, pneumonitis.  In the differential is Mallory-Weiss tear, bleeding peptic ulcer, upper GI bleed with the epigastric tenderness.  Doubt esophageal varices.  Given that her vitals are normal  will start with an x-ray here.  Will reevaluate.  Covid PCR sent.  Reviewed imaging independently.  Normal  chest.  See radiology report for full details.  Chest x-ray is normal, making a pulmonary source less likely.  Discussed with patient possibility of going to the ED versus starting Pepcid 20 mg bid, Protonix 40 mg qd, close outpatient follow-up with GI. Patient has opted for the latter with the understanding that if this happens again she will go immediately to the ED. Will place GI and primary care referral. Also providing primary care list .  Discussed imaging, MDM, treatment plan, and plan for follow-up with patient. Discussed sn/sx that should prompt return to the ED. patient agrees with plan.   Meds ordered this encounter  Medications  . pantoprazole (PROTONIX) 40 MG tablet    Sig: Take 1 tablet (40 mg total) by mouth daily.    Dispense:  20 tablet    Refill:  0  . famotidine (PEPCID) 20 MG tablet    Sig: Take 1 tablet (20 mg total) by mouth 2 (two) times daily.    Dispense:  40 tablet    Refill:  0    *This clinic note was created using [employer]. Therefore, there may be occasional mistakes despite careful proofreading.   ?    Melynda Ripple, MD 02/05/20 [phone]

## 2020-02-04 NOTE — Discharge Instructions (Addendum)
Go immediately to the ER if this happens again, if your abdominal pain gets worse, or for any concerns. I am starting you on some Pepcid and some Protonix in case this is coming from your stomach. This should help with the nausea and abdominal pain. Follow-up with [facility] as soon as you can and with a primary care provider also as soon as you can. See list below.  Below is a list of primary care practices who are taking new patients for you to follow-up with.  [facility] internal medicine clinic Ground Floor - [facility], [address] [place], [place], [place] [address] [phone]  [facility] Primary Care at [facility] [address] [place], [place] [address] [phone]  [facility] [address] [place], [place] [address] [phone]  [facility] [phone] [address] [place] [address]  [facility]: [address] [place] [address]  [phone]  [facility]: [address] [place] [address]   [phone]  [facility]: [address] [place] [address]  [phone]  [facility] : [address] [place] [address] [phone]  [facility] Primary Care: [address] [place] [address] [phone]  [facility]: [address] [place] [address] [phone]  Dr. Oneal Grout [address] [facility] [place] [address]  [phone]  Dr. Jackie Plum, [facility]. [address] [place], [place] [address]  [phone]  Go to www.goodrx.com to look up your medications. This will give you a list of where you can find your prescriptions at the most affordable prices. Or ask the pharmacist what the cash  price is, or if they have any other discount programs available to help make your medication more affordable. This can be less expensive than what you would pay with insurance.

## 2020-02-05 LAB — SARS CORONAVIRUS 2 (TAT 6-24 HRS): SARS Coronavirus 2: NEGATIVE

## 2020-02-27 ENCOUNTER — Ambulatory Visit: Payer: Medicare HMO | Admitting: Family

## 2020-03-09 ENCOUNTER — Ambulatory Visit: Payer: Medicare HMO | Admitting: Family

## 2020-03-12 ENCOUNTER — Ambulatory Visit (HOSPITAL_COMMUNITY)
Admission: EM | Admit: 2020-03-12 | Discharge: 2020-03-12 | Disposition: A | Discharge status: Home / Self Care | Payer: Medicare HMO | Attending: Family Medicine | Admitting: Family Medicine

## 2020-03-12 ENCOUNTER — Other Ambulatory Visit: Payer: Self-pay

## 2020-03-12 ENCOUNTER — Encounter (HOSPITAL_COMMUNITY): Payer: Self-pay | Admitting: Emergency Medicine

## 2020-03-12 DIAGNOSIS — M5431 Sciatica, right side: Secondary | ICD-10-CM

## 2020-03-12 MED ORDER — PREDNISONE 10 MG (21) PO TBPK: ORAL_TABLET | ORAL | 0 refills | Status: DC

## 2020-03-12 MED ORDER — TRAMADOL HCL 50 MG PO TABS: 50.0000 mg | ORAL_TABLET | Freq: Two times a day (BID) | ORAL | 0 refills | Status: DC | PRN

## 2020-03-12 MED ORDER — KETOROLAC TROMETHAMINE 30 MG/ML IJ SOLN
30.0000 mg | Freq: Once | INTRAMUSCULAR | Status: AC
  Administered 2020-03-12: 30 mg via INTRAMUSCULAR

## 2020-03-12 MED ORDER — KETOROLAC TROMETHAMINE 30 MG/ML IJ SOLN
INTRAMUSCULAR | Status: AC
  Filled 2020-03-12: qty 1

## 2020-03-12 NOTE — ED Triage Notes (Signed)
Pt c/o right leg pain x 4 months. Pt states she is unsure where the pain is coming from but the pain is not allowing her to sleep anymore. Pt states the pain is like a shooting pain that comes from her inner thigh all down her leg. Pt states she was using a topical cream and heating pad which did not help and taking tylenol.

## 2020-03-12 NOTE — Discharge Instructions (Addendum)
Treating you for your chronic pain. You need to follow-up with orthopedic specialist for further management Take the medication as prescribed

## 2020-03-15 NOTE — ED Provider Notes (Signed)
[facility]    CSN: [id number] Arrival date & time: 03/12/20  1102      History   Chief Complaint Chief Complaint  Patient presents with  . Leg Pain    HPI Gina Richards is a 59 y.o. female.   Patient is a 59 year old female that presents today with chronic back pain with radiation down the right leg, numbness and tingling.  History of sciatic nerve pain.  Reports this is been ongoing issue waxing waning for the past 4 months.  Unable to sleep due to the pain.  Has been using topical pain reliever, heme pad and Tylenol which have not relieved her symptoms.  No fever, chills or urinary symptoms.  Recent steroid injection of the right hip.  Reporting this helped momentarily.  ROS per HPI      Past Medical History:  Diagnosis Date  . Anxiety   . Bipolar 1 disorder (HCC)   . Depression   . Hypertension     Patient Active Problem List   Diagnosis Date Noted  . Cocaine abuse with cocaine-induced mood disorder (HCC) 07/10/2017  . Cannabis use disorder, severe, dependence (HCC) 07/10/2017    History reviewed. No pertinent surgical history.  OB History   No obstetric history on file.      Home Medications    Prior to Admission medications   Medication Sig Start Date End Date Taking? Authorizing Provider  famotidine (PEPCID) 20 MG tablet Take 1 tablet (20 mg total) by mouth 2 (two) times daily. 02/04/20   Domenick Gong, MD  lisinopril (ZESTRIL) 5 MG tablet Take 1 tablet (5 mg total) by mouth daily. 04/19/19   Mardella Layman, MD  pantoprazole (PROTONIX) 40 MG tablet Take 1 tablet (40 mg total) by mouth daily. 02/04/20   Domenick Gong, MD  predniSONE (STERAPRED UNI-PAK 21 TAB) 10 MG (21) TBPK tablet 6 tabs for 1 day, then 5 tabs for 1 das, then 4 tabs for 1 day, then 3 tabs for 1 day, 2 tabs for 1 day, then 1 tab for 1 day 03/12/20   Dahlia Byes A, NP  PROAIR HFA 108 (90 Base) MCG/ACT inhaler INHALE 1 TO 2 PUFFS BY MOUTH  EVERY 4 TO 6 HOURS AS NEEDED  FOR SOB/WHEEZING 02/15/17   [provider]  traMADol (ULTRAM) 50 MG tablet Take 1 tablet (50 mg total) by mouth every 12 (twelve) hours as needed. 03/12/20   Philmore Lepore, Gloris Manchester A, NP  escitalopram (LEXAPRO) 20 MG tablet Take 1 tablet (20 mg total) by mouth daily. 04/19/19 01/10/20  Mardella Layman, MD  promethazine (PHENERGAN) 25 MG tablet Take 1 tablet (25 mg total) by mouth every 8 (eight) hours as needed for nausea or vomiting. Patient not taking: Reported on 05/10/2015 11/10/14 06/15/15  Charlestine Night, PA-C  QUEtiapine (SEROQUEL) 300 MG tablet Take 300 mg by mouth at bedtime. 03/13/17 04/19/19  [provider]  traZODone (DESYREL) 50 MG tablet Take 50 mg by mouth at bedtime. 03/13/17 04/19/19  [provider]    Family History Family History  Family history unknown: Yes    Social History Social History   Tobacco Use  . Smoking status: Current Every Day Smoker    Packs/day: 0.50    Types: Cigarettes  . Smokeless tobacco: Never Used  Vaping Use  . Vaping Use: Never used  Substance Use Topics  . Alcohol use: Yes    Comment: One beer but she will not inform how often.   . Drug use: No  Allergies   Patient has no known allergies.   Review of Systems Review of Systems   Physical Exam Triage Vital Signs ED Triage Vitals  Enc Vitals Group     BP 03/12/20 1152 (!) 145/93     Pulse Rate 03/12/20 1152 85     Resp 03/12/20 1152 20     Temp 03/12/20 1152 98.7 F (37.1 C)     Temp Source 03/12/20 1152 Oral     SpO2 03/12/20 1152 98 %     Weight --      Height --      Head Circumference --      Peak Flow --      Pain Score 03/12/20 1151 10     Pain Loc --      Pain Edu? --      Excl. in GC? --    No data found.  Updated Vital Signs BP (!) 145/93 (BP Location: Left Arm)   Pulse 85   Temp 98.7 F (37.1 C) (Oral)   Resp 20   SpO2 98%   Visual Acuity Right Eye Distance:   Left Eye Distance:   Bilateral Distance:    Right Eye Near:   Left  Eye Near:    Bilateral Near:     Physical Exam Vitals and nursing note reviewed.  Constitutional:      General: She is not in acute distress.    Appearance: Normal appearance. She is not ill-appearing, toxic-appearing or diaphoretic.     Comments: Pt in wheelchair, crying   HENT:     Head: Normocephalic.     Nose: Nose normal.  Eyes:     Conjunctiva/sclera: Conjunctivae normal.  Pulmonary:     Effort: Pulmonary effort is normal.  Musculoskeletal:        General: Normal range of motion.     Cervical back: Normal range of motion.  Skin:    General: Skin is warm and dry.     Findings: No rash.  Neurological:     Mental Status: She is alert.  Psychiatric:        Mood and Affect: Mood normal.      UC Treatments / Results  Labs (all labs ordered are listed, but only abnormal results are displayed) Labs Reviewed - No data to display  EKG   Radiology No results found.  Procedures Procedures (including critical care time)  Medications Ordered in UC Medications  ketorolac (TORADOL) 30 MG/ML injection 30 mg (30 mg Intramuscular Given 03/12/20 1219)    Initial Impression / Assessment and Plan / UC Course  I have reviewed the triage vital signs and the nursing notes.  Pertinent labs & imaging results that were available during my care of the patient were reviewed by me and considered in my medical decision making (see chart for details).     Chronic sciatic nerve pain Treating with prednisone taper over the next 6 days. Tramadol for severe pain as needed. Recommend follow-up with her doctor for continued problems Final Clinical Impressions(s) / UC Diagnoses   Final diagnoses:  Sciatic nerve pain, right     Discharge Instructions     Treating you for your chronic pain. You need to follow-up with orthopedic specialist for further management Take the medication as prescribed    ED Prescriptions    Medication Sig Dispense Auth. Provider   traMADol (ULTRAM)  50 MG tablet Take 1 tablet (50 mg total) by mouth every 12 (twelve) hours as needed. 15 tablet  Ann-Marie Kluge A, NP   predniSONE (STERAPRED UNI-PAK 21 TAB) 10 MG (21) TBPK tablet 6 tabs for 1 day, then 5 tabs for 1 das, then 4 tabs for 1 day, then 3 tabs for 1 day, 2 tabs for 1 day, then 1 tab for 1 day 21 tablet Daneli Butkiewicz A, NP     I have reviewed the PDMP during this encounter.   Dahlia Byes A, NP 03/15/20 [phone]

## 2020-04-16 ENCOUNTER — Encounter (HOSPITAL_COMMUNITY): Payer: Self-pay

## 2020-04-16 ENCOUNTER — Ambulatory Visit (HOSPITAL_COMMUNITY)
Admission: EM | Admit: 2020-04-16 | Discharge: 2020-04-16 | Disposition: A | Discharge status: Home / Self Care | Payer: Medicare Other | Attending: Family Medicine | Admitting: Family Medicine

## 2020-04-16 ENCOUNTER — Other Ambulatory Visit: Payer: Self-pay

## 2020-04-16 DIAGNOSIS — M79604 Pain in right leg: Secondary | ICD-10-CM

## 2020-04-16 DIAGNOSIS — M5431 Sciatica, right side: Secondary | ICD-10-CM

## 2020-04-16 MED ORDER — NABUMETONE 750 MG PO TABS: 750.0000 mg | ORAL_TABLET | Freq: Every day | ORAL | 0 refills | Status: DC

## 2020-04-16 MED ORDER — HYDROCODONE-ACETAMINOPHEN 5-325 MG PO TABS: 1.0000 | ORAL_TABLET | Freq: Four times a day (QID) | ORAL | 0 refills | Status: AC | PRN

## 2020-04-16 MED ORDER — CYCLOBENZAPRINE HCL 10 MG PO TABS: 10.0000 mg | ORAL_TABLET | Freq: Two times a day (BID) | ORAL | 0 refills | Status: DC | PRN

## 2020-04-16 MED ORDER — DEXAMETHASONE SODIUM PHOSPHATE 10 MG/ML IJ SOLN
INTRAMUSCULAR | Status: AC
  Filled 2020-04-16: qty 1

## 2020-04-16 MED ORDER — KETOROLAC TROMETHAMINE 30 MG/ML IJ SOLN
INTRAMUSCULAR | Status: AC
  Filled 2020-04-16: qty 1

## 2020-04-16 MED ORDER — DEXAMETHASONE SODIUM PHOSPHATE 10 MG/ML IJ SOLN
10.0000 mg | Freq: Once | INTRAMUSCULAR | Status: AC
  Administered 2020-04-16: 10 mg via INTRAMUSCULAR

## 2020-04-16 MED ORDER — KETOROLAC TROMETHAMINE 30 MG/ML IJ SOLN
30.0000 mg | Freq: Once | INTRAMUSCULAR | Status: AC
  Administered 2020-04-16: 30 mg via INTRAMUSCULAR

## 2020-04-16 NOTE — ED Provider Notes (Signed)
[facility]   [id number] 04/16/20 Arrival Time: 1558  BJ:SEGBT PAIN  SUBJECTIVE: History from: patient. Gina Richards is a 59 y.o. female complains of left low back and leg pain.  Reports that the pain radiates all the way down to her foot.  Reports that she has history of sciatica on the right side.  Patient is going around on the exam table stating "I just cannot take this pain anymore."  Describes the pain as constant and sharp.  Has been treated in this office for sciatica previously with steroid injection, prednisone taper, tramadol.  She reports that this was not effective for her. Symptoms are made worse with activity.  Denies similar symptoms in the past.  Denies fever, chills, erythema, ecchymosis, effusion, weakness, numbness and tingling, saddle paresthesias, loss of bowel or bladder function.      ROS: As per HPI.  All other pertinent ROS negative.     Past Medical History:  Diagnosis Date  . Anxiety   . Bipolar 1 disorder (HCC)   . Depression   . Hypertension    History reviewed. No pertinent surgical history. No Known Allergies No current facility-administered medications on file prior to encounter.   Current Outpatient Medications on File Prior to Encounter  Medication Sig Dispense Refill  . famotidine (PEPCID) 20 MG tablet Take 1 tablet (20 mg total) by mouth 2 (two) times daily. 40 tablet 0  . lisinopril (ZESTRIL) 5 MG tablet Take 1 tablet (5 mg total) by mouth daily. 30 tablet 2  . pantoprazole (PROTONIX) 40 MG tablet Take 1 tablet (40 mg total) by mouth daily. 20 tablet 0  . PROAIR HFA 108 (90 Base) MCG/ACT inhaler INHALE 1 TO 2 PUFFS BY MOUTH  EVERY 4 TO 6 HOURS AS NEEDED FOR SOB/WHEEZING  0  . [DISCONTINUED] escitalopram (LEXAPRO) 20 MG tablet Take 1 tablet (20 mg total) by mouth daily. 30 tablet 2  . [DISCONTINUED] promethazine (PHENERGAN) 25 MG tablet Take 1 tablet (25 mg total) by mouth every 8 (eight) hours as needed for nausea or vomiting.  (Patient not taking: Reported on 05/10/2015) 15 tablet 0  . [DISCONTINUED] QUEtiapine (SEROQUEL) 300 MG tablet Take 300 mg by mouth at bedtime.  3  . [DISCONTINUED] traZODone (DESYREL) 50 MG tablet Take 50 mg by mouth at bedtime.  3   Social History   Socioeconomic History  . Marital status: Single    Spouse name: Not on file  . Number of children: Not on file  . Years of education: Not on file  . Highest education level: Not on file  Occupational History  . Not on file  Tobacco Use  . Smoking status: Current Every Day Smoker    Packs/day: 0.50    Types: Cigarettes  . Smokeless tobacco: Never Used  Vaping Use  . Vaping Use: Never used  Substance and Sexual Activity  . Alcohol use: Yes    Comment: One beer but she will not inform how often.   . Drug use: No  . Sexual activity: Not on file  Other Topics Concern  . Not on file  Social History Narrative  . Not on file   Social Determinants of Health   Financial Resource Strain:   . Difficulty of Paying Living Expenses: Not on file  Food Insecurity:   . Worried About Programme researcher, broadcasting/film/video in the Last Year: Not on file  . Ran Out of Food in the Last Year: Not on file  Transportation Needs:   .  Lack of Transportation (Medical): Not on file  . Lack of Transportation (Non-Medical): Not on file  Physical Activity:   . Days of Exercise per Week: Not on file  . Minutes of Exercise per Session: Not on file  Stress:   . Feeling of Stress : Not on file  Social Connections:   . Frequency of Communication with Friends and Family: Not on file  . Frequency of Social Gatherings with Friends and Family: Not on file  . Attends Religious Services: Not on file  . Active Member of Clubs or Organizations: Not on file  . Attends Banker Meetings: Not on file  . Marital Status: Not on file  Intimate Partner Violence:   . Fear of Current or Ex-Partner: Not on file  . Emotionally Abused: Not on file  . Physically Abused: Not on  file  . Sexually Abused: Not on file   Family History  Family history unknown: Yes    OBJECTIVE:  Vitals:   04/16/20 1752  BP: (!) 175/117  Pulse: 72  Resp: 16  Temp: 98.2 F (36.8 C)  TempSrc: Oral  SpO2: 99%    General appearance: ALERT; in no acute distress.  Head: NCAT Lungs: Normal respiratory effort CV:  pulses 2+ bilaterally. Cap refill < 2 seconds Musculoskeletal:  Inspection: Skin warm, dry, clear and intact without obvious erythema, effusion, or ecchymosis.  Palpation: right low back tender to palpation ROM: FROM active and passive Skin: warm and dry Neurologic: Ambulates without difficulty; Sensation intact about the upper/ lower extremities Psychological: alert and cooperative; normal mood and affect  DIAGNOSTIC STUDIES:  No results found.   ASSESSMENT & PLAN:  1. Right sided sciatica   2. Right leg pain     Meds ordered this encounter  Medications  . ketorolac (TORADOL) 30 MG/ML injection 30 mg  . dexamethasone (DECADRON) injection 10 mg  . cyclobenzaprine (FLEXERIL) 10 MG tablet    Sig: Take 1 tablet (10 mg total) by mouth 2 (two) times daily as needed for muscle spasms.    Dispense:  20 tablet    Refill:  0    Order Specific Question:   Supervising Provider    Answer:   Merrilee Jansky [id number]  . HYDROcodone-acetaminophen (NORCO/VICODIN) 5-325 MG tablet    Sig: Take 1 tablet by mouth every 6 (six) hours as needed for up to 3 days for moderate pain or severe pain.    Dispense:  10 tablet    Refill:  0    Order Specific Question:   Supervising Provider    Answer:   Merrilee Jansky [id number]  . nabumetone (RELAFEN) 750 MG tablet    Sig: Take 1 tablet (750 mg total) by mouth daily.    Dispense:  30 tablet    Refill:  0    Order Specific Question:   Supervising Provider    Answer:   Merrilee Jansky [[id number]]   Toradol 30mg  IM in office today Decadron 10mg  IM in office today Prescribed nabumetone Prescribed flexeril Prescribed  Norco Continue conservative management of rest, ice, and gentle stretches Take nabumetone as needed for pain relief (may cause abdominal discomfort, ulcers, and GI bleeds avoid taking with other NSAIDs) Take cyclobenzaprine at nighttime for symptomatic relief. Avoid driving or operating heavy machinery while using medication. Follow up with PCP if symptoms persist Return or go to the ER if you have any new or worsening symptoms (fever, chills, chest pain, abdominal pain, changes in bowel or bladder  habits, pain radiating into lower legs)   [place] Controlled Substances Registry consulted for this patient. I feel the risk/benefit ratio today is favorable for proceeding with this prescription for a controlled substance. Medication sedation precautions given.  Reviewed expectations re: course of current medical issues. Questions answered. Outlined signs and symptoms indicating need for more acute intervention. Patient verbalized understanding. After Visit Summary given.       Moshe Cipro, NP 04/16/20 1817

## 2020-04-16 NOTE — ED Triage Notes (Signed)
Pt present right leg pain, this has been going on for a long time per pt. She is having trouble walking on the leg.

## 2020-04-16 NOTE — Discharge Instructions (Addendum)
Take the norco for pain as prescribed  Take the muscle relaxer Flexeril as needed for muscle spasm; Do not drive, operate machinery, or drink alcohol with this medication as it may make you drowsy.    Take nabumetone (anti-inflammatory) daily for pain and inflammation. Do not take ibuprofen or naproxen with this medication  Follow up with your primary care provider or an orthopedist if your pain is not improving.

## 2020-04-28 ENCOUNTER — Other Ambulatory Visit: Payer: Self-pay

## 2020-04-28 ENCOUNTER — Encounter (HOSPITAL_COMMUNITY): Payer: Self-pay | Admitting: Emergency Medicine

## 2020-04-28 ENCOUNTER — Ambulatory Visit (HOSPITAL_COMMUNITY)
Admission: EM | Admit: 2020-04-28 | Discharge: 2020-04-28 | Disposition: A | Discharge status: Home / Self Care | Payer: Medicare Other | Attending: Family Medicine | Admitting: Family Medicine

## 2020-04-28 DIAGNOSIS — M5431 Sciatica, right side: Secondary | ICD-10-CM

## 2020-04-28 MED ORDER — PREDNISONE 10 MG PO TABS: ORAL_TABLET | ORAL | 0 refills | Status: DC

## 2020-04-28 MED ORDER — CYCLOBENZAPRINE HCL 10 MG PO TABS: 10.0000 mg | ORAL_TABLET | Freq: Three times a day (TID) | ORAL | 0 refills | Status: DC | PRN

## 2020-04-28 NOTE — ED Triage Notes (Signed)
Pt presents with right hip and leg pain. States this is a reoccurring thing. Request x-ray.

## 2020-04-28 NOTE — Discharge Instructions (Signed)
[facility] [address] [place], [place] [address] [phone]   

## 2020-04-28 NOTE — ED Provider Notes (Signed)
[facility]    CSN: [id number] Arrival date & time: 04/28/20  1144      History   Chief Complaint Chief Complaint  Patient presents with  . Leg Pain    HPI Gina Richards is a 59 y.o. female.   Here today for a recurrent issue with right sided sciatica that has been episodic for over a year now. Most recent flare has been worsening over the past week. Pain begins in right hip and shoots down leg toward foot. Some numbness and tingling at times. No recent injury, swelling down leg, discoloration, bowel or bladder incontinence. Has had numerous conservative therapies for this over the last year or more including steroid tapers, muscle relaxers, pain medications, and steroid injections (most recently had one from Dr. Alvester Morin 01/2020). She states nothing has helped for more than a few days and is very frustrated by the pain and the amount that it interferes with her ADLs.      Past Medical History:  Diagnosis Date  . Anxiety   . Bipolar 1 disorder (HCC)   . Depression   . Hypertension     Patient Active Problem List   Diagnosis Date Noted  . Cocaine abuse with cocaine-induced mood disorder (HCC) 07/10/2017  . Cannabis use disorder, severe, dependence (HCC) 07/10/2017    History reviewed. No pertinent surgical history.  OB History   No obstetric history on file.      Home Medications    Prior to Admission medications   Medication Sig Start Date End Date Taking? Authorizing Provider  cyclobenzaprine (FLEXERIL) 10 MG tablet Take 1 tablet (10 mg total) by mouth 3 (three) times daily as needed for muscle spasms. DO NOT DRIVE WHILE TAKING THIS MEDICATION 04/28/20   Particia Nearing, PA-C  famotidine (PEPCID) 20 MG tablet Take 1 tablet (20 mg total) by mouth 2 (two) times daily. 02/04/20   Domenick Gong, MD  lisinopril (ZESTRIL) 5 MG tablet Take 1 tablet (5 mg total) by mouth daily. 04/19/19   Mardella Layman, MD  nabumetone (RELAFEN) 750 MG tablet Take 1  tablet (750 mg total) by mouth daily. 04/16/20   Moshe Cipro, NP  pantoprazole (PROTONIX) 40 MG tablet Take 1 tablet (40 mg total) by mouth daily. 02/04/20   Domenick Gong, MD  predniSONE (DELTASONE) 10 MG tablet Take 6 tabs one, 5 tabs day two, 4 tabs day three, etc 04/28/20   Particia Nearing, PA-C  PROAIR HFA 108 [phone] Base) MCG/ACT inhaler INHALE 1 TO 2 PUFFS BY MOUTH  EVERY 4 TO 6 HOURS AS NEEDED FOR SOB/WHEEZING 02/15/17   [provider]  escitalopram (LEXAPRO) 20 MG tablet Take 1 tablet (20 mg total) by mouth daily. 04/19/19 01/10/20  Mardella Layman, MD  promethazine (PHENERGAN) 25 MG tablet Take 1 tablet (25 mg total) by mouth every 8 (eight) hours as needed for nausea or vomiting. Patient not taking: Reported on 05/10/2015 11/10/14 06/15/15  Charlestine Night, PA-C  QUEtiapine (SEROQUEL) 300 MG tablet Take 300 mg by mouth at bedtime. 03/13/17 04/19/19  [provider]  traZODone (DESYREL) 50 MG tablet Take 50 mg by mouth at bedtime. 03/13/17 04/19/19  [provider]    Family History Family History  Family history unknown: Yes    Social History Social History   Tobacco Use  . Smoking status: Current Every Day Smoker    Packs/day: 0.50    Types: Cigarettes  . Smokeless tobacco: Never Used  Vaping Use  . Vaping Use:  Never used  Substance Use Topics  . Alcohol use: Yes    Comment: One beer but she will not inform how often.   . Drug use: No     Allergies   Patient has no known allergies.   Review of Systems Review of Systems   Physical Exam Triage Vital Signs ED Triage Vitals  Enc Vitals Group     BP 04/28/20 1330 (!) 150/109     Pulse Rate 04/28/20 1330 78     Resp 04/28/20 1330 18     Temp 04/28/20 1330 98.2 F (36.8 C)     Temp Source 04/28/20 1330 Oral     SpO2 04/28/20 1330 98 %     Weight --      Height --      Head Circumference --      Peak Flow --      Pain Score 04/28/20 1328 10     Pain Loc --      Pain Edu?  --      Excl. in GC? --    No data found.  Updated Vital Signs BP (!) 150/109 (BP Location: Right Arm)   Pulse 78   Temp 98.2 F (36.8 C) (Oral)   Resp 18   SpO2 98%   Visual Acuity Right Eye Distance:   Left Eye Distance:   Bilateral Distance:    Right Eye Near:   Left Eye Near:    Bilateral Near:     Physical Exam Vitals and nursing note reviewed.  Constitutional:      Appearance: Normal appearance. She is not ill-appearing.  HENT:     Head: Atraumatic.  Eyes:     Extraocular Movements: Extraocular movements intact.     Conjunctiva/sclera: Conjunctivae normal.  Cardiovascular:     Rate and Rhythm: Normal rate and regular rhythm.     Heart sounds: Normal heart sounds.  Pulmonary:     Effort: Pulmonary effort is normal.     Breath sounds: Normal breath sounds.  Musculoskeletal:        General: No swelling or deformity.     Cervical back: Normal range of motion and neck supple.     Comments: Antalgic gait, orthopedic exam limited by patient pain level - SLR as far as can be determined   Skin:    General: Skin is warm and dry.     Findings: No erythema.  Neurological:     Mental Status: She is alert and oriented to person, place, and time.  Psychiatric:        Mood and Affect: Mood normal.        Thought Content: Thought content normal.        Judgment: Judgment normal.    UC Treatments / Results  Labs (all labs ordered are listed, but only abnormal results are displayed) Labs Reviewed - No data to display  EKG   Radiology No results found.  Procedures Procedures (including critical care time)  Medications Ordered in UC Medications - No data to display  Initial Impression / Assessment and Plan / UC Course  I have reviewed the triage vital signs and the nursing notes.  Pertinent labs & imaging results that were available during my care of the patient were reviewed by me and considered in my medical decision making (see chart for details).      Acute on chronic issue, will tx this flare with prednisone taper, flexeril prn and gave pt number for Orthopedic office she went  to back in June so she can follow up for further management given her failure of numerous conservative therapies. Work note given for the next 3 days, discussed that she will need to establish with primary care provider or get paperwork from the specialist if requiring more time off from work than that.    Final Clinical Impressions(s) / UC Diagnoses   Final diagnoses:  Right sided sciatica     Discharge Instructions     [facility]  [address]  [place], [place] [address]  [phone]      ED Prescriptions    Medication Sig Dispense Auth. Provider   cyclobenzaprine (FLEXERIL) 10 MG tablet Take 1 tablet (10 mg total) by mouth 3 (three) times daily as needed for muscle spasms. DO NOT DRIVE WHILE TAKING THIS MEDICATION 30 tablet Particia Nearing, PA-C   predniSONE (DELTASONE) 10 MG tablet Take 6 tabs one, 5 tabs day two, 4 tabs day three, etc 21 tablet Particia Nearing, [place]     PDMP not reviewed this encounter.   Particia Nearing, [place] 04/28/20 1450

## 2020-05-04 ENCOUNTER — Telehealth: Payer: Self-pay | Admitting: Physical Medicine and Rehabilitation

## 2020-05-04 NOTE — Telephone Encounter (Signed)
Patient called. She would like an appointment with Dr. Alvester Morin. Her CB # is [phone]

## 2020-05-05 NOTE — Telephone Encounter (Signed)
If last injection helped more than 50% then rpeat otherwise can do any of the following 1)L5 tf to hurting side 2) MRI lspine 3)f/u Denny Peon

## 2020-05-05 NOTE — Telephone Encounter (Signed)
Patient had a right hip injection on 6/9. She was scheduled to follow up with Barnie Del, but she canceled 2 appointments. Has been seen in urgent care a few times since injection for right sided sciatica. She did report to them that she had relief for a brief time following the hip injection. Please advise.

## 2020-05-06 NOTE — Telephone Encounter (Signed)
Called patient to discuss. Mailbox is full.

## 2020-05-07 NOTE — Telephone Encounter (Signed)
Called patient again. Mailbox full.   

## 2020-05-10 ENCOUNTER — Telehealth: Payer: Self-pay | Admitting: Physical Medicine and Rehabilitation

## 2020-05-10 NOTE — Telephone Encounter (Signed)
Patient called requesting to set appt for left hip and leg pains. Please call patient to set appt. Patient phone number is [phone].

## 2020-05-10 NOTE — Telephone Encounter (Signed)
Per font desk: Patient called requesting to set appt for left hip and leg pains. Please call patient to set appt. Patient phone number is [phone].  I returned patient's call. No answer, and voicemail is full.

## 2020-05-10 NOTE — Telephone Encounter (Signed)
See previous message

## 2020-05-11 ENCOUNTER — Encounter (HOSPITAL_COMMUNITY): Payer: Self-pay

## 2020-05-11 ENCOUNTER — Emergency Department (HOSPITAL_COMMUNITY): Payer: Medicare Other

## 2020-05-11 ENCOUNTER — Emergency Department (HOSPITAL_COMMUNITY)
Admission: EM | Admit: 2020-05-11 | Discharge: 2020-05-12 | Disposition: A | Discharge status: Home / Self Care | Payer: Medicare Other | Attending: Emergency Medicine | Admitting: Emergency Medicine

## 2020-05-11 ENCOUNTER — Other Ambulatory Visit: Payer: Self-pay

## 2020-05-11 DIAGNOSIS — R519 Headache, unspecified: Secondary | ICD-10-CM | POA: Insufficient documentation

## 2020-05-11 DIAGNOSIS — Z5321 Procedure and treatment not carried out due to patient leaving prior to being seen by health care provider: Secondary | ICD-10-CM | POA: Diagnosis not present

## 2020-05-11 DIAGNOSIS — S0081XA Abrasion of other part of head, initial encounter: Secondary | ICD-10-CM | POA: Insufficient documentation

## 2020-05-11 NOTE — ED Notes (Signed)
Pt stated they are waiting outside with daughter

## 2020-05-11 NOTE — Telephone Encounter (Signed)
See previous messages. I have called the patient 3 times with no answer and her voicemail is full.
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
## 2020-05-11 NOTE — Telephone Encounter (Signed)
Patient called again and I received the following message from the front desk: Patient called she is waiting for a phone call back she stated its urgent and medication isn't helping. call back:[phone]  I called the patient again and again there was no answer and mailbox is full.

## 2020-05-11 NOTE — ED Notes (Signed)
Pt witnessed getting into car and leaving, staff not alerted

## 2020-05-11 NOTE — Telephone Encounter (Signed)
Patient called she is waiting for a phone call back she stated its urgent and medication isn't helping. call [phone]

## 2020-05-12 NOTE — Telephone Encounter (Signed)
Called patient a 5th time. No answer and mailbox is full.

## 2020-05-18 ENCOUNTER — Telehealth: Payer: Self-pay | Admitting: Physical Medicine and Rehabilitation

## 2020-05-18 NOTE — Telephone Encounter (Signed)
Patient called. She would like an appointment with Dr. Alvester Morin. Her call back number is [phone]

## 2020-05-19 NOTE — Telephone Encounter (Signed)
Tried to call patient again. Again call went to voicemail after one ring.

## 2020-05-19 NOTE — Telephone Encounter (Signed)
Please Advise

## 2020-05-19 NOTE — Telephone Encounter (Signed)
Patient called again.   She was being extremely rude because she has been waiting on a call back to schedule with [facility]. I assured her that the message was sent and explained to her that there is nothing more myself nor the other receptionist could to push a call back. I advised that she make sure communication lines are free because last time we had this issue, it was indeed her fault that she was not getting our calls. She continued to cut me off and express her anger so she was forwarded to [place].   Call back: [phone]

## 2020-05-19 NOTE — Telephone Encounter (Signed)
I returned patient's call. Call went to voicemail after one ring. I left a message.

## 2020-05-20 NOTE — Telephone Encounter (Signed)
Called patient again and left message.

## 2020-05-24 NOTE — Telephone Encounter (Signed)
Called patient again. Call went to voicemail after one right. I left another message. This is my 9th attempt to contact this patient since her first call on 9/14. Closing encounter.

## 2020-06-02 ENCOUNTER — Telehealth: Payer: Self-pay | Admitting: Physical Medicine and Rehabilitation

## 2020-06-02 NOTE — Telephone Encounter (Signed)
Patient called. She would like an appointment with Dr. Newton. Her call back number is [phone] °

## 2020-06-02 NOTE — Telephone Encounter (Signed)
Called patient back. No answer and mailbox is full. See previous messages from 9/14 and 9/20. This was a 10th attempt to contact the patient. 9/14 call contains Dr. Crandall Blas advise for scheduling.

## 2020-06-11 ENCOUNTER — Telehealth: Payer: Self-pay | Admitting: Physical Medicine and Rehabilitation

## 2020-06-11 NOTE — Telephone Encounter (Signed)
Pt called stating she would still like to get scheduled for an appt; I did explain to the pt that courtney had tried 10 times with no answer or anyway to leave a message and pt states she understands and will answer if courtney could try her @ 2  [phone]

## 2020-06-14 ENCOUNTER — Other Ambulatory Visit: Payer: Self-pay

## 2020-06-14 ENCOUNTER — Ambulatory Visit (HOSPITAL_COMMUNITY)
Admission: EM | Admit: 2020-06-14 | Discharge: 2020-06-14 | Disposition: A | Discharge status: Home / Self Care | Payer: Medicare Other | Attending: Internal Medicine | Admitting: Internal Medicine

## 2020-06-14 ENCOUNTER — Ambulatory Visit (INDEPENDENT_AMBULATORY_CARE_PROVIDER_SITE_OTHER): Payer: Medicare Other

## 2020-06-14 ENCOUNTER — Encounter (HOSPITAL_COMMUNITY): Payer: Self-pay

## 2020-06-14 DIAGNOSIS — M25551 Pain in right hip: Secondary | ICD-10-CM

## 2020-06-14 DIAGNOSIS — M25559 Pain in unspecified hip: Secondary | ICD-10-CM

## 2020-06-14 MED ORDER — TRAMADOL HCL 50 MG PO TABS: 50.0000 mg | ORAL_TABLET | Freq: Two times a day (BID) | ORAL | 0 refills | Status: DC | PRN

## 2020-06-14 MED ORDER — PREDNISONE 10 MG (21) PO TBPK: ORAL_TABLET | Freq: Every day | ORAL | 0 refills | Status: DC

## 2020-06-14 MED ORDER — CYCLOBENZAPRINE HCL 10 MG PO TABS: 10.0000 mg | ORAL_TABLET | Freq: Three times a day (TID) | ORAL | 0 refills | Status: AC | PRN

## 2020-06-14 MED ORDER — KETOROLAC TROMETHAMINE 30 MG/ML IJ SOLN
30.0000 mg | Freq: Once | INTRAMUSCULAR | Status: AC
  Administered 2020-06-14: 30 mg via INTRAMUSCULAR

## 2020-06-14 MED ORDER — KETOROLAC TROMETHAMINE 30 MG/ML IJ SOLN
INTRAMUSCULAR | Status: AC
  Filled 2020-06-14: qty 1
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
## 2020-06-14 NOTE — ED Triage Notes (Signed)
Pt presents with complaints of right hip and leg pain. Reports the pain shoots down from her hip down into her leg. Denies any injury. Reports trying repositioning at home and pain medications with no relief. Reports that she is having worse pain at night.

## 2020-06-14 NOTE — Discharge Instructions (Addendum)
Gentle range of motion exercises Take medications as prescribed If symptoms worsen please return to the urgent care If symptoms are persistent, referral to orthopedic surgery will be appropriate You may need to see an orthopedic surgeon for further evaluation as you may eventually need a hip replacement.

## 2020-06-14 NOTE — Telephone Encounter (Signed)
Patient was seen in Urgent Care today. She will see how treatment there and prescriptions given help with her hip pain. She reports that she did not have 50% relief of her pain following her right hip injection in June 2021.

## 2020-06-25 ENCOUNTER — Telehealth: Payer: Self-pay | Admitting: Physical Medicine and Rehabilitation

## 2020-06-25 NOTE — Telephone Encounter (Signed)
Patient called. She would like an appointment with Dr Alvester Morin. Her call back number is [phone]

## 2020-06-28 NOTE — Telephone Encounter (Signed)
If pain into the groin and hurts with going from sit to stand, in and out of the car then return to [place] or Ortho. Looks like Denny Peon saw her only. If pain not like that then can one time L5 tf esi.

## 2020-06-28 NOTE — Telephone Encounter (Signed)
This has been an ongoing issue of patient not answering and not having voicemail. She had a hip injection on 6/9. This was your response to original call on 9/15: If last injection helped more than 50% then rpeat otherwise can do any of the following 1)L5 tf to hurting side 2) MRI lspine 3)f/u Erin. I was finally able to speak with the patient on 10/25. She was seen in urgent care that day and wanted to see how the medication prescribed helped with her pain. She reported that she did not have 50% relief from her hip injection on 6/9. Please advise.

## 2020-06-29 ENCOUNTER — Other Ambulatory Visit: Payer: Self-pay | Admitting: Physical Medicine and Rehabilitation

## 2020-06-29 MED ORDER — TRAMADOL HCL 50 MG PO TABS
50.0000 mg | ORAL_TABLET | Freq: Two times a day (BID) | ORAL | 0 refills | Status: DC | PRN
Start: 2020-06-29 — End: 2020-08-24

## 2020-06-29 NOTE — Telephone Encounter (Signed)
Tramadol done, small note written for plan

## 2020-06-29 NOTE — Progress Notes (Signed)
Ok for small amount of tramadol prior to injection, if more needed will need follow up with Erin. Checked Control substance and multiple providers but small amount over 6 months.

## 2020-06-29 NOTE — Telephone Encounter (Signed)
Auth # not req, pt ok to keep sch.

## 2020-06-29 NOTE — Telephone Encounter (Signed)
Shena: is auth needed for [id number]? Scheduled for 12/1.  Dr. Alvester Morin: patient is requesting a refill of Tramdol

## 2020-07-21 ENCOUNTER — Ambulatory Visit: Payer: Medicare Other | Admitting: Physical Medicine and Rehabilitation

## 2020-07-26 ENCOUNTER — Other Ambulatory Visit: Payer: Self-pay | Admitting: Student

## 2020-07-26 DIAGNOSIS — Z1231 Encounter for screening mammogram for malignant neoplasm of breast: Secondary | ICD-10-CM

## 2020-07-28 ENCOUNTER — Ambulatory Visit: Payer: Medicare Other

## 2020-08-24 ENCOUNTER — Other Ambulatory Visit: Payer: Self-pay

## 2020-08-24 ENCOUNTER — Encounter (HOSPITAL_COMMUNITY): Payer: Self-pay

## 2020-08-24 ENCOUNTER — Ambulatory Visit (HOSPITAL_COMMUNITY)
Admission: EM | Admit: 2020-08-24 | Discharge: 2020-08-24 | Disposition: A | Discharge status: Home / Self Care | Payer: Medicare (Managed Care) | Attending: Urgent Care | Admitting: Urgent Care

## 2020-08-24 DIAGNOSIS — G894 Chronic pain syndrome: Secondary | ICD-10-CM

## 2020-08-24 DIAGNOSIS — M1611 Unilateral primary osteoarthritis, right hip: Secondary | ICD-10-CM

## 2020-08-24 DIAGNOSIS — Z8669 Personal history of other diseases of the nervous system and sense organs: Secondary | ICD-10-CM

## 2020-08-24 DIAGNOSIS — M79604 Pain in right leg: Secondary | ICD-10-CM

## 2020-08-24 DIAGNOSIS — I10 Essential (primary) hypertension: Secondary | ICD-10-CM

## 2020-08-24 DIAGNOSIS — R229 Localized swelling, mass and lump, unspecified: Secondary | ICD-10-CM

## 2020-08-24 MED ORDER — PREDNISONE 20 MG PO TABS: ORAL_TABLET | ORAL | 0 refills | Status: DC

## 2020-08-24 MED ORDER — LISINOPRIL 20 MG PO TABS: 20.0000 mg | ORAL_TABLET | Freq: Every day | ORAL | 0 refills | Status: DC

## 2020-08-24 MED ORDER — TRAMADOL HCL 50 MG PO TABS
50.0000 mg | ORAL_TABLET | Freq: Two times a day (BID) | ORAL | 0 refills | Status: DC | PRN
Start: 2020-08-24 — End: 2021-05-30

## 2020-08-24 NOTE — Discharge Instructions (Addendum)
I suspect that your pain is related to your arthritis and history of sciatica.  As you have not had any recent falls or trauma, we did not do any x-rays.  I highly recommend establishing care with an orthopedist from [facility] or [facility].  Please make sure that you follow-up with them for long-term management of your chronic pain.  As your blood pressure is really uncontrolled and you are not taken blood pressure medicine, I am refilling your lisinopril at 20 mg instead of 5mg .  This will allow to use an oral steroid course to help you the most with your arthritic pain.  I want you to schedule Tylenol at a dose of 500 mg to 650 mg every 6-8 hours for general management of your pain.  It is okay to use tramadol for breakthrough pain.  But if you do not need tramadol which is a narcotic pain medicine then do not use it.  For diabetes or elevated blood sugar, please make sure you are limiting and avoiding starchy, carbohydrate foods like pasta, breads, sweet breads, pastry, rice, potatoes, desserts. These foods can elevate your blood sugar. Also, limit and avoid drinks that contain a lot of sugar such as sodas, sweet teas, fruit juices.  Drinking plain water will be much more helpful, try 64 ounces of water daily.  It is okay to flavor your water naturally by cutting cucumber, lemon, mint or lime, placing it in a picture with water and drinking it over a period of 24-48 hours as long as it remains refrigerated.  For elevated blood pressure, make sure you are monitoring salt in your diet.  Do not eat restaurant foods and limit processed foods at home. I highly recommend you prepare and cook your own foods at home.  Processed foods include things like frozen meals, pre-seasoned meats and dinners, deli meats, canned foods as these foods contain a high amount of sodium/salt.  Make sure you are paying attention to sodium labels on foods you buy at the grocery store. Buy your spices separately such as  garlic powder, onion powder, cumin, cayenne, parsley flakes so that you can avoid seasonings that contain salt. However, salt-free seasonings are available and can be used, an example is Mrs. Dash and includes a lot of different mixtures that do not contain salt.  Lastly, when cooking using oils that are healthier for you is important. This includes olive oil, avocado oil, canola oil. We have discussed a lot of foods to avoid but below is a list of foods that can be very healthy to use in your diet whether it is for diabetes, cholesterol, high blood pressure, or in general healthy eating.  Salads - kale, spinach, cabbage, spring mix, arugula Fruits - avocadoes, berries (blueberries, raspberries, blackberries), apples, oranges, pomegranate, grapefruit, kiwi Vegetables - asparagus, cauliflower, broccoli, green beans, brussel sprouts, bell peppers, beets; stay away from or limit starchy vegetables like potatoes, carrots, peas Other general foods - kidney beans, egg whites, almonds, walnuts, sunflower seeds, pumpkin seeds, fat free yogurt, almond milk, flax seeds, quinoa, oats  Meat - It is better to eat lean meats and limit your red meat including pork to once a week.  Wild caught fish, chicken breast are good options as they tend to be leaner sources of good protein. Still be mindful of the sodium labels for the meats you buy.  DO NOT EAT ANY FOODS ON THIS LIST THAT YOU ARE ALLERGIC TO. For more specific needs, I highly recommend  consulting a dietician or nutritionist but this can definitely be a good starting point.

## 2020-08-24 NOTE — ED Provider Notes (Signed)
[facility]   MRN: [id number] DOB: 1961/08/20  Subjective:   Gina Richards is a 60 y.o. female presenting for recurrent right-sided hip pain, right-sided thigh pain, right sided buttock pain, right knee pain.  Patient has had ongoing chronic pains all related to sciatica over her arthritis.  Patient does not have an orthopedist.  She has been using Tylenol.  Denies any falls or traumas.  Regarding her blood pressure, patient is a smoker, does not take her blood pressure medications because she states she ran out of it.  Denies any chest pain, shortness of breath, heart racing, confusion, headache.  Patient also states that she has had 1 month history of a skin mass over her right medial thigh.  Denies tenderness, drainage, bleeding.  No current facility-administered medications for this encounter.  Current Outpatient Medications:  .  cyclobenzaprine (FLEXERIL) 10 MG tablet, Take 1 tablet (10 mg total) by mouth 3 (three) times daily as needed for muscle spasms. DO NOT DRIVE WHILE TAKING THIS MEDICATION, Disp: 30 tablet, Rfl: 0 .  famotidine (PEPCID) 20 MG tablet, Take 1 tablet (20 mg total) by mouth 2 (two) times daily., Disp: 40 tablet, Rfl: 0 .  lisinopril (ZESTRIL) 5 MG tablet, Take 1 tablet (5 mg total) by mouth daily., Disp: 30 tablet, Rfl: 2 .  nabumetone (RELAFEN) 750 MG tablet, Take 1 tablet (750 mg total) by mouth daily., Disp: 30 tablet, Rfl: 0 .  pantoprazole (PROTONIX) 40 MG tablet, Take 1 tablet (40 mg total) by mouth daily., Disp: 20 tablet, Rfl: 0 .  predniSONE (STERAPRED UNI-PAK 21 TAB) 10 MG (21) TBPK tablet, Take by mouth daily. Take 6 tabs by mouth daily  for 2 days, then 5 tabs for 2 days, then 4 tabs for 2 days, then 3 tabs for 2 days, 2 tabs for 2 days, then 1 tab by mouth daily for 2 days, Disp: 42 tablet, Rfl: 0 .  PROAIR HFA 108 (90 Base) MCG/ACT inhaler, INHALE 1 TO 2 PUFFS BY MOUTH  EVERY 4 TO 6 HOURS AS NEEDED FOR SOB/WHEEZING, Disp: , Rfl: 0 .   traMADol (ULTRAM) 50 MG tablet, Take 1 tablet (50 mg total) by mouth every 12 (twelve) hours as needed., Disp: 15 tablet, Rfl: 0   No Known Allergies  Past Medical History:  Diagnosis Date  . Anxiety   . Bipolar 1 disorder (HCC)   . Depression   . Hypertension      History reviewed. No pertinent surgical history.  Family History  Family history unknown: Yes    Social History   Tobacco Use  . Smoking status: Current Every Day Smoker    Packs/day: 0.50    Types: Cigarettes  . Smokeless tobacco: Never Used  Vaping Use  . Vaping Use: Never used  Substance Use Topics  . Alcohol use: Yes    Comment: One beer but she will not inform how often.   . Drug use: No    ROS   Objective:   Vitals: BP (!) 181/109 (BP Location: Right Arm) Comment: has not been without medication for 2 months  Pulse 98   Temp 97.8 F (36.6 C) (Oral)   Resp 17   SpO2 100%   BP 163/104 on recheck by PA Jeremaih Klima.  Physical Exam Constitutional:      General: She is not in acute distress.    Appearance: Normal appearance. She is well-developed. She is not ill-appearing, toxic-appearing or diaphoretic.  HENT:  Head: Normocephalic and atraumatic.     Nose: Nose normal.     Mouth/Throat:     Mouth: Mucous membranes are moist.  Eyes:     Extraocular Movements: Extraocular movements intact.     Pupils: Pupils are equal, round, and reactive to light.  Cardiovascular:     Rate and Rhythm: Normal rate and regular rhythm.     Pulses: Normal pulses.     Heart sounds: Normal heart sounds. No murmur heard. No friction rub. No gallop.   Pulmonary:     Effort: Pulmonary effort is normal. No respiratory distress.     Breath sounds: Normal breath sounds. No stridor. No wheezing, rhonchi or rales.  Skin:    General: Skin is warm and dry.     Findings: No rash.          Comments: Patient ambulates without assistance.  She does favor her right hip and thigh.  Has tenderness about the right groin, right  patellar tendon and patella, medial joint line of the right knee.  No lumbar spine tenderness.  Negative straight leg raise.  Neurological:     Mental Status: She is alert and oriented to person, place, and time.     Cranial Nerves: No cranial nerve deficit.     Motor: No weakness.     Coordination: Coordination normal.     Gait: Gait normal.     Deep Tendon Reflexes: Reflexes normal.  Psychiatric:        Mood and Affect: Mood normal.        Behavior: Behavior normal.        Thought Content: Thought content normal.      Assessment and Plan :   PDMP not reviewed this encounter.  1. Right leg pain   2. Skin mass   3. History of sciatica   4. Essential hypertension   5. Osteoarthritis of right hip, unspecified osteoarthritis type   6. Chronic pain syndrome     Patient requested an injection and strong pain medicine.  I counseled against using injections of steroids given her uncontrolled hypertension.  Emphasized need for better control, refilled her lisinopril and increased her dosage from 5 mg to 20 mg.  Recommended close follow-up with her PCP.  As I am attempting to control her blood pressure medication better, will trial her on prednisone for arthritic, sciatic type pain.  Establish care with a new orthopedist, information provided to the patient. Counseled patient on potential for adverse effects with medications prescribed/recommended today, ER and return-to-clinic precautions discussed, patient verbalized understanding.    Wallis Bamberg, [place] 08/24/20 1842

## 2020-08-24 NOTE — ED Triage Notes (Signed)
Pt presents with possible abscess on right groin area for few months.  Pt also has right side sciatic pain that starts at right lower back and radiates down right leg for past few months.

## 2020-12-16 ENCOUNTER — Telehealth: Payer: Self-pay

## 2020-12-16 NOTE — Telephone Encounter (Signed)
Called patient to schedule dermatology appointment   Appointment Note: Ref by [facility] (1cm mobile right medical thigh mass)  Thanks!

## 2021-01-20 ENCOUNTER — Other Ambulatory Visit: Payer: Self-pay | Admitting: Family Medicine

## 2021-01-20 ENCOUNTER — Ambulatory Visit
Admission: RE | Admit: 2021-01-20 | Discharge: 2021-01-20 | Disposition: A | Discharge status: Home / Self Care | Payer: Medicare HMO | Source: Ambulatory Visit | Attending: Family Medicine | Admitting: Family Medicine

## 2021-01-20 DIAGNOSIS — M25569 Pain in unspecified knee: Secondary | ICD-10-CM

## 2021-03-21 ENCOUNTER — Emergency Department (HOSPITAL_COMMUNITY)
Admission: EM | Admit: 2021-03-21 | Discharge: 2021-03-21 | Disposition: A | Discharge status: Home / Self Care | Payer: Medicare HMO | Attending: Emergency Medicine | Admitting: Emergency Medicine

## 2021-03-21 ENCOUNTER — Other Ambulatory Visit: Payer: Self-pay

## 2021-03-21 ENCOUNTER — Emergency Department (HOSPITAL_COMMUNITY): Payer: Medicare HMO

## 2021-03-21 ENCOUNTER — Encounter (HOSPITAL_COMMUNITY): Payer: Self-pay

## 2021-03-21 DIAGNOSIS — I10 Essential (primary) hypertension: Secondary | ICD-10-CM | POA: Insufficient documentation

## 2021-03-21 DIAGNOSIS — M25551 Pain in right hip: Secondary | ICD-10-CM | POA: Diagnosis present

## 2021-03-21 DIAGNOSIS — M87051 Idiopathic aseptic necrosis of right femur: Secondary | ICD-10-CM | POA: Insufficient documentation

## 2021-03-21 DIAGNOSIS — Z79899 Other long term (current) drug therapy: Secondary | ICD-10-CM | POA: Insufficient documentation

## 2021-03-21 DIAGNOSIS — F17210 Nicotine dependence, cigarettes, uncomplicated: Secondary | ICD-10-CM | POA: Insufficient documentation

## 2021-03-21 DIAGNOSIS — M1611 Unilateral primary osteoarthritis, right hip: Secondary | ICD-10-CM | POA: Insufficient documentation

## 2021-03-21 MED ORDER — OXYCODONE HCL 5 MG PO TABS: 5.0000 mg | ORAL_TABLET | Freq: Four times a day (QID) | ORAL | 0 refills | Status: DC | PRN

## 2021-03-21 MED ORDER — OXYCODONE-ACETAMINOPHEN 5-325 MG PO TABS
1.0000 | ORAL_TABLET | Freq: Once | ORAL | Status: AC
  Administered 2021-03-21: 1 via ORAL
  Filled 2021-03-21: qty 1

## 2021-03-21 MED ORDER — OXYCODONE HCL 5 MG PO TABS: 5.0000 mg | ORAL_TABLET | Freq: Four times a day (QID) | ORAL | 0 refills | Status: AC | PRN

## 2021-03-21 NOTE — ED Provider Notes (Signed)
[facility]-EMERGENCY DEPT Provider Note   CSN: [id number] Arrival date & time: 03/21/21  0913     History Chief Complaint  Patient presents with   Leg Pain    Gina Richards is a 60 y.o. female.  Gina Richards resents with somewhat chronic pain in her right anterior hip and right knee.  The knee pain is located diffusely over the joint.  There is some mild swelling at the knee.  No injury.  Pain is worse when she attempts to ambulate, and she has started to use a cane.  She has had some injections which have helped for short period of time.  No neurologic symptoms.  No systemic symptoms of fever, chills.  The history is provided by the patient.  Leg Pain Location:  Knee and hip Injury: no   Hip location:  R hip Knee location:  R knee Pain details:    Quality:  Shooting   Radiates to:  Does not radiate   Severity:  Severe   Onset quality:  Gradual   Duration: present for 3-4 months.   Timing:  Constant   Progression:  Worsening Chronicity:  New Prior injury to area:  No Relieved by:  Nothing Worsened by:  Bearing weight Ineffective treatments:  Rest (tramadol) Associated symptoms: back pain (sometimes) and swelling (knee)   Associated symptoms: no fever, no numbness and no tingling       Past Medical History:  Diagnosis Date   Anxiety    Bipolar 1 disorder (HCC)    Depression    Hypertension     Patient Active Problem List   Diagnosis Date Noted   Cocaine abuse with cocaine-induced mood disorder (HCC) 07/10/2017   Cannabis use disorder, severe, dependence (HCC) 07/10/2017    History reviewed. No pertinent surgical history.   OB History   No obstetric history on file.     Family History  Family history unknown: Yes    Social History   Tobacco Use   Smoking status: Every Day    Packs/day: 0.50    Types: Cigarettes   Smokeless tobacco: Never  Vaping Use   Vaping Use: Never used  Substance Use Topics   Alcohol use: Yes     Comment: One beer but she will not inform how often.    Drug use: No    Home Medications Prior to Admission medications   Medication Sig Start Date End Date Taking? Authorizing Provider  cyclobenzaprine (FLEXERIL) 10 MG tablet Take 1 tablet (10 mg total) by mouth 3 (three) times daily as needed for muscle spasms. DO NOT DRIVE WHILE TAKING THIS MEDICATION 06/14/20   Merrilee Jansky, MD  famotidine (PEPCID) 20 MG tablet Take 1 tablet (20 mg total) by mouth 2 (two) times daily. 02/04/20   Domenick Gong, MD  lisinopril (ZESTRIL) 20 MG tablet Take 1 tablet (20 mg total) by mouth daily. 08/24/20   Wallis Bamberg, PA-C  nabumetone (RELAFEN) 750 MG tablet Take 1 tablet (750 mg total) by mouth daily. 04/16/20   Moshe Cipro, NP  pantoprazole (PROTONIX) 40 MG tablet Take 1 tablet (40 mg total) by mouth daily. 02/04/20   Domenick Gong, MD  predniSONE (DELTASONE) 20 MG tablet Take 2 tablets daily with breakfast. 08/24/20   Wallis Bamberg, PA-C  PROAIR HFA 108 (90 Base) MCG/ACT inhaler INHALE 1 TO 2 PUFFS BY MOUTH  EVERY 4 TO 6 HOURS AS NEEDED FOR SOB/WHEEZING 02/15/17   [provider]  traMADol (ULTRAM) 50 MG  tablet Take 1 tablet (50 mg total) by mouth every 12 (twelve) hours as needed. 08/24/20   Wallis Bamberg, PA-C  escitalopram (LEXAPRO) 20 MG tablet Take 1 tablet (20 mg total) by mouth daily. 04/19/19 01/10/20  Mardella Layman, MD  promethazine (PHENERGAN) 25 MG tablet Take 1 tablet (25 mg total) by mouth every 8 (eight) hours as needed for nausea or vomiting. Patient not taking: Reported on 05/10/2015 11/10/14 06/15/15  Charlestine Night, PA-C  QUEtiapine (SEROQUEL) 300 MG tablet Take 300 mg by mouth at bedtime. 03/13/17 04/19/19  [provider]  traZODone (DESYREL) 50 MG tablet Take 50 mg by mouth at bedtime. 03/13/17 04/19/19  [provider]    Allergies    Patient has no known allergies.  Review of Systems   Review of Systems  Constitutional:  Negative for chills and  fever.  HENT:  Negative for ear pain and sore throat.   Eyes:  Negative for pain and visual disturbance.  Respiratory:  Negative for cough and shortness of breath.   Cardiovascular:  Negative for chest pain and palpitations.  Gastrointestinal:  Negative for abdominal pain and vomiting.  Genitourinary:  Negative for dysuria and hematuria.  Musculoskeletal:  Positive for back pain (sometimes). Negative for arthralgias.  Skin:  Negative for color change and rash.  Neurological:  Negative for seizures and syncope.  All other systems reviewed and are negative.  Physical Exam Updated Vital Signs BP (!) 110/97 (BP Location: Right Arm)   Pulse 90   Temp 98.4 F (36.9 C) (Oral)   Resp 16   Ht 5' 2.5" (1.588 m)   Wt 72.6 kg   SpO2 96%   BMI 28.80 kg/m   Physical Exam Vitals and nursing note reviewed.  HENT:     Head: Normocephalic and atraumatic.  Eyes:     General: No scleral icterus. Pulmonary:     Effort: Pulmonary effort is normal. No respiratory distress.  Musculoskeletal:     Cervical back: Normal range of motion.     Comments: The affected extremity is normal to inspection.  There is full range of motion of the right hip with mild pain with internal rotation.  There is no joint effusion in the knee, but there is mild and diffuse tenderness to palpation about the entire knee.  Range of motion is full but painful with terminal flexion.  The extremity is warm, well-perfused, and sensation is normal.  Skin:    General: Skin is warm and dry.  Neurological:     General: No focal deficit present.     Mental Status: She is alert and oriented to person, place, and time.  Psychiatric:        Mood and Affect: Mood normal.    ED Results / Procedures / Treatments   Labs (all labs ordered are listed, but only abnormal results are displayed) Labs Reviewed - No data to display  EKG None  Radiology DG Knee Complete 4 Views Right  Result Date: 03/21/2021 CLINICAL DATA:  Right leg  and right hip pain for 3-4 months. No injury. EXAM: RIGHT KNEE - COMPLETE 4+ VIEW COMPARISON:  None. FINDINGS: Small joint effusion.  No fracture.  No degenerative changes. IMPRESSION: Small joint effusion. Electronically Signed   By: Leanna Battles M.D.   On: 03/21/2021 11:53   DG Hip Unilat With Pelvis 2-3 Views Right  Result Date: 03/21/2021 CLINICAL DATA:  Right leg and right hip pain for 3-4 months. No injury. EXAM: DG HIP (WITH OR WITHOUT  PELVIS) 2-3V RIGHT COMPARISON:  None. FINDINGS: There is complete loss is superior aspect of the right hip of joint space, with slight flattening of the femoral head. Extensive subchondral sclerosis and cyst formation as well as osteophytosis. Slight lateral subluxation of the femoral head. Mild subchondral sclerosis and osteophytosis in the left hip. Calcified lesion in the anatomic pelvis is likely a fibroid. Degenerative changes in the spine. IMPRESSION: 1. Advanced right hip osteoarthritis with slight compression of the right femoral head. 2. Mild left hip osteoarthritis. Electronically Signed   By: Leanna Battles M.D.   On: 03/21/2021 11:52    Procedures Procedures   Medications Ordered in ED Medications  oxyCODONE-acetaminophen (PERCOCET/ROXICET) 5-325 MG per tablet 1 tablet (has no administration in time range)    ED Course  I have reviewed the triage vital signs and the nursing notes.  Pertinent labs & imaging results that were available during my care of the patient were reviewed by me and considered in my medical decision making (see chart for details).    MDM Rules/Calculators/A&P                           SHANDELL GIOVANNI presents with chronic pain of the hip and knee.  She was found to have severe osteonecrosis and osteoarthritis of her right hip.  She may require a hip replacement.  She was referred to orthopedics.  I did give her narcotic pain medication due to the severity of this condition.  However she was advised that the limits of  the ER in managing chronic pain in the necessity to follow-up with a specialist.  She was also advised on the appropriate use of this medication as well as its side effects.  Final Clinical Impression(s) / ED Diagnoses Final diagnoses:  Avascular necrosis of bone of hip, right (HCC)  Osteoarthritis of right hip, unspecified osteoarthritis type    Rx / DC Orders ED Discharge Orders          Ordered    oxyCODONE (ROXICODONE) 5 MG immediate release tablet  Every 6 hours PRN        03/21/21 1202             Koleen Distance, MD 03/21/21 1205

## 2021-03-21 NOTE — ED Triage Notes (Signed)
Patient c/o right leg and right hip pain x 3-4 months. Patient states she has been to the physician to see about her pain. Patient states, "They only give me shot and once th eshot wears off my leg hurts again."

## 2021-05-09 ENCOUNTER — Other Ambulatory Visit: Payer: Self-pay | Admitting: Orthopedic Surgery

## 2021-05-09 DIAGNOSIS — Z01811 Encounter for preprocedural respiratory examination: Secondary | ICD-10-CM

## 2021-05-11 NOTE — Progress Notes (Signed)
Sent message, via epic in basket, requesting orders in epic from surgeon.  

## 2021-05-18 ENCOUNTER — Other Ambulatory Visit: Payer: Self-pay | Admitting: Orthopedic Surgery

## 2021-05-18 ENCOUNTER — Encounter (HOSPITAL_COMMUNITY): Payer: Self-pay

## 2021-05-18 NOTE — Progress Notes (Signed)
Please place orders in epic pt. Is scheduled for preop 

## 2021-05-18 NOTE — Patient Instructions (Addendum)
DUE TO COVID-19 ONLY ONE VISITOR IS ALLOWED TO COME WITH YOU AND STAY IN THE WAITING ROOM ONLY DURING PRE OP AND PROCEDURE DAY OF SURGERY.   TWO VISITOR  MAY VISIT WITH YOU AFTER SURGERY IN YOUR PRIVATE ROOM DURING VISITING HOURS ONLY!  YOU NEED TO HAVE A COVID 19 TEST ON__10-4-22_____between 8a-3p______, THIS TEST MUST BE DONE BEFORE SURGERY,     Please bring completed form with you to the COVID testing site   COVID TESTING SITE [address] [place]   [phone]      ONCE YOUR COVID TEST IS COMPLETED,  PLEASE Wear a mask when in public           Your procedure is scheduled on: 05-27-21   Report to [facility] Main  Entrance   Report to admitting at        0720 AM     Call this number if you have problems the morning of surgery [phone]   Remember: Do not eat food or drink liquids :After Midnight. You may have clear liquids until  0700 am then NOTHING BY MOUTH    CLEAR LIQUID DIET                                                                    water Black Coffee and tea, regular and decaf                             Plain Jell-O any favor except red or purple                                  Fruit ices (not with fruit pulp)                                      Iced Popsicles                                     Carbonated beverages, regular and diet                                    Cranberry, grape and apple juices Sports drinks like Gatorade Lightly seasoned clear broth or consume(fat free) Sugar, honey syrup   _____________________________________________________________________     BRUSH YOUR TEETH MORNING OF SURGERY AND RINSE YOUR MOUTH OUT, NO CHEWING GUM CANDY OR MINTS.     Take these medicines the morning of surgery with A SIP OF WATER:   DO NOT TAKE ANY DIABETIC MEDICATIONS DAY OF YOUR SURGERY                               You may not have any metal on your body including hair pins and              piercings  Do not wear  jewelry, make-up, lotions,  powders,perfumes,      deodorant             Do not wear nail polish on your fingernails or toenails .  Do not shave  48 hours prior to surgery.            Do not bring valuables to the hospital. [name] IS NOT             RESPONSIBLE   FOR VALUABLES.  Contacts, dentures or bridgework may not be worn into surgery.  You may bring a small overnight bag with you     Patients discharged the day of surgery will not be allowed to drive home. IF YOU ARE HAVING SURGERY AND GOING HOME THE SAME DAY, YOU MUST HAVE AN ADULT TO DRIVE YOU HOME AND BE WITH YOU FOR 24 HOURS. YOU MAY GO HOME BY TAXI OR UBER OR ORTHERWISE, BUT AN ADULT MUST ACCOMPANY YOU HOME AND STAY WITH YOU FOR 24 HOURS.  Name and phone number of your driver:  Special Instructions: N/A              Please read over the following fact sheets you were given: _____________________________________________________________________             [facility] - Preparing for Surgery Before surgery, you can play an important role.  Because skin is not sterile, your skin needs to be as free of germs as possible.  You can reduce the number of germs on your skin by washing with CHG (chlorahexidine gluconate) soap before surgery.  CHG is an antiseptic cleaner which kills germs and bonds with the skin to continue killing germs even after washing. Please DO NOT use if you have an allergy to CHG or antibacterial soaps.  If your skin becomes reddened/irritated stop using the CHG and inform your nurse when you arrive at Short Stay. Do not shave (including legs and underarms) for at least 48 hours prior to the first CHG shower.  You may shave your face/neck. Please follow these instructions carefully:  1.  Shower with CHG Soap the night before surgery and the  morning of Surgery.  2.  If you choose to wash your hair, wash your hair first as usual with your  normal  shampoo.  3.  After you shampoo, rinse your hair and body  thoroughly to remove the  shampoo.                           4.  Use CHG as you would any other liquid soap.  You can apply chg directly  to the skin and wash                       Gently with a scrungie or clean washcloth.  5.  Apply the CHG Soap to your body ONLY FROM THE NECK DOWN.   Do not use on face/ open                           Wound or open sores. Avoid contact with eyes, ears mouth and genitals (private parts).                       Wash face,  Genitals (private parts) with your normal soap.             6.  Wash thoroughly, paying special attention  to the area where your surgery  will be performed.  7.  Thoroughly rinse your body with warm water from the neck down.  8.  DO NOT shower/wash with your normal soap after using and rinsing off  the CHG Soap.                9.  Pat yourself dry with a clean towel.            10.  Wear clean pajamas.            11.  Place clean sheets on your bed the night of your first shower and do not  sleep with pets. Day of Surgery : Do not apply any lotions/deodorants the morning of surgery.  Please wear clean clothes to the hospital/surgery center.  FAILURE TO FOLLOW THESE INSTRUCTIONS MAY RESULT IN THE CANCELLATION OF YOUR SURGERY PATIENT SIGNATURE_________________________________  NURSE SIGNATURE__________________________________  ________________________________________________________________________    Gina Richards  An incentive spirometer is a tool that can help keep your lungs clear and active. This tool measures how well you are filling your lungs with each breath. Taking long deep breaths may help reverse or decrease the chance of developing breathing (pulmonary) problems (especially infection) following: A long period of time when you are unable to move or be active. BEFORE THE PROCEDURE  If the spirometer includes an indicator to show your best effort, your nurse or respiratory therapist will set it to a desired goal. If  possible, sit up straight or lean slightly forward. Try not to slouch. Hold the incentive spirometer in an upright position. INSTRUCTIONS FOR USE  Sit on the edge of your bed if possible, or sit up as far as you can in bed or on a chair. Hold the incentive spirometer in an upright position. Breathe out normally. Place the mouthpiece in your mouth and seal your lips tightly around it. Breathe in slowly and as deeply as possible, raising the piston or the ball toward the top of the column. Hold your breath for 3-5 seconds or for as long as possible. Allow the piston or ball to fall to the bottom of the column. Remove the mouthpiece from your mouth and breathe out normally. Rest for a few seconds and repeat Steps 1 through 7 at least 10 times every 1-2 hours when you are awake. Take your time and take a few normal breaths between deep breaths. The spirometer may include an indicator to show your best effort. Use the indicator as a goal to work toward during each repetition. After each set of 10 deep breaths, practice coughing to be sure your lungs are clear. If you have an incision (the cut made at the time of surgery), support your incision when coughing by placing a pillow or rolled up towels firmly against it. Once you are able to get out of bed, walk around indoors and cough well. You may stop using the incentive spirometer when instructed by your caregiver.  RISKS AND COMPLICATIONS Take your time so you do not get dizzy or light-headed. If you are in pain, you may need to take or ask for pain medication before doing incentive spirometry. It is harder to take a deep breath if you are having pain. AFTER USE Rest and breathe slowly and easily. It can be helpful to keep track of a log of your progress. Your caregiver can provide you with a simple table to help with this. If you are using the spirometer at home, follow these  instructions: SEEK MEDICAL CARE IF:  You are having difficultly using the  spirometer. You have trouble using the spirometer as often as instructed. Your pain medication is not giving enough relief while using the spirometer. You develop fever of 100.5 F (38.1 C) or higher. SEEK IMMEDIATE MEDICAL CARE IF:  You cough up bloody sputum that had not been present before. You develop fever of 102 F (38.9 C) or greater. You develop worsening pain at or near the incision site. MAKE SURE YOU:  Understand these instructions. Will watch your condition. Will get help right away if you are not doing well or get worse. Document Released: 12/18/2006 Document Revised: 10/30/2011 Document Reviewed: 02/18/2007 [facility] Patient Information 2014 [place], [place].   ________________________________________________________________________

## 2021-05-20 ENCOUNTER — Encounter (HOSPITAL_COMMUNITY): Payer: Self-pay

## 2021-05-20 ENCOUNTER — Encounter (HOSPITAL_COMMUNITY)
Admission: RE | Admit: 2021-05-20 | Discharge: 2021-05-20 | Disposition: A | Discharge status: Home / Self Care | Payer: Medicare HMO | Source: Ambulatory Visit | Attending: Orthopedic Surgery | Admitting: Orthopedic Surgery

## 2021-05-20 HISTORY — DX: Cerebral infarction, unspecified: I63.9

## 2021-05-20 HISTORY — DX: Type 2 diabetes mellitus without complications: E11.9

## 2021-05-20 HISTORY — DX: Pneumonia, unspecified organism: J18.9

## 2021-05-20 NOTE — Progress Notes (Signed)
PCP - Loura Back, NP Cardiologist - no  PPM/ICD -  Device Orders -  Rep Notified -   Chest x-ray -  EKG -  Stress Test -  ECHO -  Cardiac Cath -   Sleep Study -  CPAP -   Fasting Blood Sugar -  Checks Blood Sugar _____ times a day  Blood Thinner Instructions: Aspirin Instructions:  ERAS Protcol - PRE-SURGERY Ensure or G2-   COVID TEST- 05-24-21 COVID vaccine -  Activity--Able to walk around home without SOB or CP limited due to knee pain. Able to walk to mailbox without SOB Anesthesia review:   Patient denies shortness of breath, fever, cough and chest pain at PAT appointment   All instructions explained to the patient, with a verbal understanding of the material. Patient agrees to go over the instructions while at home for a better understanding. Patient also instructed to self quarantine after being tested for COVID-19. The opportunity to ask questions was provided.

## 2021-05-24 ENCOUNTER — Other Ambulatory Visit (HOSPITAL_COMMUNITY): Payer: Self-pay

## 2021-05-24 NOTE — Progress Notes (Addendum)
COVID swab appointment: 05/25/21  COVID Vaccine Completed: no Date COVID Vaccine completed: Has received booster: COVID vaccine manufacturer: [employer]   Date of COVID positive in last 90 days: no  PCP - Tim Nygen [address]  Cardiologist - N/a  Chest x-ray - n/a EKG - n/a Stress Test - n/a ECHO - n/a Cardiac Cath - n/a Pacemaker/ICD device last checked: n/a Spinal Cord Stimulator: n/a  Sleep Study - n/a CPAP -   Fasting Blood Sugar -  Checks Blood Sugar _____ times a day  Blood Thinner Instructions: n/a Aspirin Instructions: Last Dose:  Activity level: Can go up a flight of stairs and perform activities of daily living without stopping and without symptoms of chest pain or shortness of breath.       Anesthesia review: HTN,DM  Patient denies shortness of breath, fever, cough and chest pain at PAT appointment   Patient verbalized understanding of instructions that were given to them at the PAT appointment. Patient was also instructed that they will need to review over the PAT instructions again at home before surgery.

## 2021-05-24 NOTE — Patient Instructions (Addendum)
DUE TO COVID-19 ONLY ONE VISITOR IS ALLOWED TO COME WITH YOU AND STAY IN THE WAITING ROOM ONLY DURING PRE OP AND PROCEDURE.   **NO VISITORS ARE ALLOWED IN THE SHORT STAY AREA OR RECOVERY ROOM!!**  IF YOU WILL BE ADMITTED INTO THE HOSPITAL YOU ARE ALLOWED ONLY TWO SUPPORT PEOPLE DURING VISITATION HOURS ONLY (10AM -8PM)   The support person(s) may change daily. The support person(s) must pass our screening, gel in and out, and wear a mask at all times, including in the patient's room. Patients must also wear a mask when staff or their support person are in the room.  No visitors under the age of 40. Any visitor under the age of 51 must be accompanied by an adult.    COVID SWAB TESTING MUST BE COMPLETED ON:  05/25/21 **MUST PRESENT COMPLETED FORM AT TESTING SITE**    [address] [place] (backside of the building) No appointment needed. Open 8am-3pm. You are not required to quarantine, however you are required to wear a well-fitted mask when you are out and around people not in your household.  Hand Hygiene often Do NOT share personal items Notify your provider if you are in close contact with someone who has COVID or you develop fever 100.4 or greater, new onset of sneezing, cough, sore throat, shortness of breath or body aches.       Your procedure is scheduled on: 05/27/21   Report to [facility] Main Entrance   Report to Short Stay at 5:15 AM   [facility])   Call this number if you have problems the morning of surgery [phone]   Do not eat food :After Midnight.   May have liquids until 4:30 AM day of surgery  CLEAR LIQUID DIET  Foods Allowed                                                                     Foods Excluded  Water, Black Coffee and tea (no milk or creamer)            liquids that you cannot  Plain Jell-O in any flavor  (No red)                                      see through such as: Fruit ices (not with fruit pulp)                                              milk, soups, orange juice              Iced Popsicles (No red)                                                 All solid food  Apple juices Sports drinks like Gatorade (No red) Lightly seasoned clear broth or consume(fat free) Sugar    Oral Hygiene is also important to reduce your risk of infection.                                    Remember - BRUSH YOUR TEETH THE MORNING OF SURGERY WITH YOUR REGULAR TOOTHPASTE   Do NOT smoke after Midnight   Take these medicines the morning of surgery with A SIP OF WATER: Flexeril   DO NOT TAKE ANY ORAL DIABETIC MEDICATIONS DAY OF YOUR SURGERY  How to Manage Your Diabetes Before and After Surgery  Why is it important to control my blood sugar before and after surgery? Improving blood sugar levels before and after surgery helps healing and can limit problems. A way of improving blood sugar control is eating a healthy diet by:  Eating less sugar and carbohydrates  Increasing activity/exercise  Talking with your doctor about reaching your blood sugar goals High blood sugars (greater than 180 mg/dL) can raise your risk of infections and slow your recovery, so you will need to focus on controlling your diabetes during the weeks before surgery. Make sure that the doctor who takes care of your diabetes knows about your planned surgery including the date and location.  How do I manage my blood sugar before surgery? Check your blood sugar at least 4 times a day, starting 2 days before surgery, to make sure that the level is not too high or low. Check your blood sugar the morning of your surgery when you wake up and every 2 hours until you get to the Short Stay unit. If your blood sugar is less than 70 mg/dL, you will need to treat for low blood sugar: Do not take insulin. Treat a low blood sugar (less than 70 mg/dL) with  cup of clear juice (cranberry or apple), 4 glucose tablets, OR glucose  gel. Recheck blood sugar in 15 minutes after treatment (to make sure it is greater than 70 mg/dL). If your blood sugar is not greater than 70 mg/dL on recheck, call [phone] for further instructions. Report your blood sugar to the short stay nurse when you get to Short Stay.  If you are admitted to the hospital after surgery: Your blood sugar will be checked by the staff and you will probably be given insulin after surgery (instead of oral diabetes medicines) to make sure you have good blood sugar levels. The goal for blood sugar control after surgery is 80-180 mg/dL.   WHAT DO I DO ABOUT MY DIABETES MEDICATION?  Do not take oral diabetes medicines (pills) the morning of surgery.  THE DAY BEFORE SURGERY, take Metformin as prescribed       THE MORNING OF SURGERY, do not take Metformin   Reviewed and Endorsed by [facility] Patient Education Committee, August 2015                               You may not have any metal on your body including hair pins, jewelry, and body piercing             Do not wear make-up, lotions, powders, perfumes, or deodorant  Do not wear nail polish including gel and S&S, artificial/acrylic nails, or any other type of covering on natural nails including finger and toenails. If you  have artificial nails, gel coating, etc. that needs to be removed by a nail salon please have this removed prior to surgery or surgery may need to be canceled/ delayed if the surgeon/ anesthesia feels like they are unable to be safely monitored.   Do not shave  48 hours prior to surgery.    Do not bring valuables to the hospital. [facility] IS NOT             RESPONSIBLE   FOR VALUABLES.   Contacts, dentures or bridgework may not be worn into surgery.   Bring small overnight bag day of surgery.   Special Instructions: Bring a copy of your healthcare power of attorney and living will documents         the day of surgery if you haven't scanned them before.  Please read over  the following fact sheets you were given: IF YOU HAVE QUESTIONS ABOUT YOUR PRE-OP INSTRUCTIONS PLEASE CALL [phone]- [facility] - Preparing for Surgery Before surgery, you can play an important role.  Because skin is not sterile, your skin needs to be as free of germs as possible.  You can reduce the number of germs on your skin by washing with CHG (chlorahexidine gluconate) soap before surgery.  CHG is an antiseptic cleaner which kills germs and bonds with the skin to continue killing germs even after washing. Please DO NOT use if you have an allergy to CHG or antibacterial soaps.  If your skin becomes reddened/irritated stop using the CHG and inform your nurse when you arrive at Short Stay. Do not shave (including legs and underarms) for at least 48 hours prior to the first CHG shower.  You may shave your face/neck.  Please follow these instructions carefully:  1.  Shower with CHG Soap the night before surgery and the  morning of surgery.  2.  If you choose to wash your hair, wash your hair first as usual with your normal  shampoo.  3.  After you shampoo, rinse your hair and body thoroughly to remove the shampoo.                             4.  Use CHG as you would any other liquid soap.  You can apply chg directly to the skin and wash.  Gently with a scrungie or clean washcloth.  5.  Apply the CHG Soap to your body ONLY FROM THE NECK DOWN.   Do   not use on face/ open                           Wound or open sores. Avoid contact with eyes, ears mouth and   genitals (private parts).                       Wash face,  Genitals (private parts) with your normal soap.             6.  Wash thoroughly, paying special attention to the area where your    surgery  will be performed.  7.  Thoroughly rinse your body with warm water from the neck down.  8.  DO NOT shower/wash with your normal soap after using and rinsing off the CHG Soap.                9.  Pat yourself dry with a  clean towel.             10.  Wear clean pajamas.            11.  Place clean sheets on your bed the night of your first shower and do not  sleep with pets. Day of Surgery : Do not apply any lotions/deodorants the morning of surgery.  Please wear clean clothes to the hospital/surgery center.  FAILURE TO FOLLOW THESE INSTRUCTIONS MAY RESULT IN THE CANCELLATION OF YOUR SURGERY  PATIENT SIGNATURE_________________________________  NURSE SIGNATURE__________________________________  ________________________________________________________________________

## 2021-05-25 ENCOUNTER — Encounter (HOSPITAL_COMMUNITY)
Admission: RE | Admit: 2021-05-25 | Discharge: 2021-05-25 | Disposition: A | Discharge status: Home / Self Care | Payer: Medicare HMO | Source: Ambulatory Visit

## 2021-05-25 NOTE — Progress Notes (Signed)
Attempted to do a preop phone call unable to understand what patient was saying over the phone and then the phone  call was lost. Tried to call patient back and there was no answer.

## 2021-05-25 NOTE — Progress Notes (Addendum)
Called patient at 8am for phone interview. Started interview questions. Patient was dozing off during interview and kept saying she was asleep. Patient stopped responding to questions. Hung up and tried again. Patient answered and answered a few questions. Writer began explaining that patient needed to get a COVID test today for her upcoming surgery. Patient became upset and line went dead. Called patient back and asked if she was awake enough to go over instructions. Patient stated she was. Part way through instructions writer asked patient if she had a way to get to the testing center today for a COVID test. Patient stated she could not today as  she has another appointment at 11am and uses transportation. Writer asked if she had any other means of getting to test as she needs the it before surgery. Line went dead. Attempted to call back x2, the phone went straight to voicemail and the inbox is full. Unable to leave message. Interview not complete. Patient did provide an email, will email instructions.  Called Dr. Luiz Blare office and short stay to inform.

## 2021-05-26 ENCOUNTER — Encounter (HOSPITAL_COMMUNITY): Payer: Self-pay

## 2021-05-26 NOTE — Progress Notes (Addendum)
Patient called today and interview over the phone was able to be completed. She did not get a COVID test yesterday. She has another appointment today and said she will try to make it here for labs and EKG. Gave patient phone number to call if she is able to come in today.  10:08 AM 05/26/21  Patient called back. She is unable to set up transport for a ride today.  10:32 AM 05/26/21

## 2021-05-26 NOTE — Anesthesia Preprocedure Evaluation (Addendum)
Anesthesia Evaluation  Patient identified by MRN, date of birth, ID band Patient awake    Reviewed: Allergy & Precautions, NPO status , Patient's Chart, lab work & pertinent test results  Airway Mallampati: II  TM Distance: >3 FB Neck ROM: Full    Dental no notable dental hx.    Pulmonary Current Smoker and Patient abstained from smoking.,    Pulmonary exam normal breath sounds clear to auscultation       Cardiovascular hypertension, Normal cardiovascular exam Rhythm:Regular Rate:Normal     Neuro/Psych Bipolar Disorder CVA    GI/Hepatic negative GI ROS,   Endo/Other  diabetes  Renal/GU negative Renal ROS  negative genitourinary   Musculoskeletal negative musculoskeletal ROS (+)   Abdominal   Peds negative pediatric ROS (+)  Hematology negative hematology ROS (+)   Anesthesia Other Findings   Reproductive/Obstetrics negative OB ROS                           Anesthesia Physical Anesthesia Plan  ASA: 3  Anesthesia Plan: General   Post-op Pain Management:    Induction: Intravenous  PONV Risk Score and Plan: 3 and Ondansetron, Dexamethasone, Propofol infusion and Treatment may vary due to age or medical condition  Airway Management Planned: Oral ETT  Additional Equipment:   Intra-op Plan:   Post-operative Plan: Extubation in OR  Informed Consent: I have reviewed the patients History and Physical, chart, labs and discussed the procedure including the risks, benefits and alternatives for the proposed anesthesia with the patient or authorized representative who has indicated his/her understanding and acceptance.     Dental advisory given  Plan Discussed with: CRNA and Surgeon  Anesthesia Plan Comments:        Anesthesia Quick Evaluation

## 2021-05-26 NOTE — Progress Notes (Signed)
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 every day smoker with h/o HTN, DM II, right hip djd scheduled for above procedure 05/27/2021 with Dr. Jodi Geralds.   Pt did not have a PAT visit, nurse attempted to review history with patient over the phone several times, unable to reach.    She will need a COVID test DOS as well as labs including CBG and A1C.    Dr. Luiz Blare office informed that pt was unable to be reached, did not have a PAT visit.  VS: There were no vitals taken for this visit.  PROVIDERS: Patient, No Pcp Per (Inactive)   LABS:  labs DOS (all labs ordered are listed, but only abnormal results are displayed)  Labs Reviewed - No data to display   IMAGES:   EKG:   CV:  Past Medical History:  Diagnosis Date   Anxiety    Bipolar 1 disorder (HCC)    Depression    Diabetes mellitus without complication (HCC)    Hypertension    Pneumonia    Stroke (HCC)     No past surgical history on file.  MEDICATIONS: No current facility-administered medications for this encounter.    cyclobenzaprine (FLEXERIL) 10 MG tablet   hydrocortisone cream 1 %   losartan-hydrochlorothiazide (HYZAAR) 50-12.5 MG tablet   metFORMIN (GLUCOPHAGE) 500 MG tablet   QUEtiapine Fumarate (SEROQUEL XR) 150 MG 24 hr tablet   lisinopril (ZESTRIL) 20 MG tablet   traMADol (ULTRAM) 50 MG tablet     [facility], PA-C [facility] Pre-Surgical Testing [phone]

## 2021-05-27 ENCOUNTER — Encounter (HOSPITAL_COMMUNITY)
Admission: RE | Disposition: A | Discharge status: Home / Self Care | Payer: Self-pay | Source: Ambulatory Visit | Attending: Orthopedic Surgery

## 2021-05-27 ENCOUNTER — Inpatient Hospital Stay (HOSPITAL_COMMUNITY): Payer: Medicare Other

## 2021-05-27 ENCOUNTER — Other Ambulatory Visit (HOSPITAL_COMMUNITY): Payer: Self-pay

## 2021-05-27 ENCOUNTER — Inpatient Hospital Stay (HOSPITAL_COMMUNITY): Payer: Medicare Other | Admitting: Physician Assistant

## 2021-05-27 ENCOUNTER — Other Ambulatory Visit: Payer: Self-pay

## 2021-05-27 ENCOUNTER — Encounter (HOSPITAL_COMMUNITY): Payer: Self-pay | Admitting: Orthopedic Surgery

## 2021-05-27 ENCOUNTER — Observation Stay (HOSPITAL_COMMUNITY)
Admission: RE | Admit: 2021-05-27 | Discharge: 2021-05-30 | Disposition: A | Discharge status: Home / Self Care | Payer: Medicare Other | Source: Ambulatory Visit | Attending: Orthopedic Surgery | Admitting: Orthopedic Surgery

## 2021-05-27 DIAGNOSIS — Z96642 Presence of left artificial hip joint: Secondary | ICD-10-CM | POA: Diagnosis present

## 2021-05-27 DIAGNOSIS — M1611 Unilateral primary osteoarthritis, right hip: Principal | ICD-10-CM | POA: Insufficient documentation

## 2021-05-27 DIAGNOSIS — F17210 Nicotine dependence, cigarettes, uncomplicated: Secondary | ICD-10-CM | POA: Insufficient documentation

## 2021-05-27 DIAGNOSIS — E119 Type 2 diabetes mellitus without complications: Secondary | ICD-10-CM | POA: Diagnosis not present

## 2021-05-27 DIAGNOSIS — I10 Essential (primary) hypertension: Secondary | ICD-10-CM | POA: Diagnosis not present

## 2021-05-27 DIAGNOSIS — Z7982 Long term (current) use of aspirin: Secondary | ICD-10-CM | POA: Diagnosis not present

## 2021-05-27 DIAGNOSIS — Z96641 Presence of right artificial hip joint: Secondary | ICD-10-CM

## 2021-05-27 DIAGNOSIS — Z20822 Contact with and (suspected) exposure to covid-19: Secondary | ICD-10-CM | POA: Diagnosis not present

## 2021-05-27 DIAGNOSIS — Z8673 Personal history of transient ischemic attack (TIA), and cerebral infarction without residual deficits: Secondary | ICD-10-CM | POA: Insufficient documentation

## 2021-05-27 DIAGNOSIS — Z01818 Encounter for other preprocedural examination: Secondary | ICD-10-CM

## 2021-05-27 DIAGNOSIS — Z419 Encounter for procedure for purposes other than remedying health state, unspecified: Secondary | ICD-10-CM

## 2021-05-27 HISTORY — PX: TOTAL HIP ARTHROPLASTY: SHX124

## 2021-05-27 LAB — CBC
HCT: 39.1 % (ref 36.0–46.0)
Hemoglobin: 13.2 g/dL (ref 12.0–15.0)
MCH: 32.5 pg (ref 26.0–34.0)
MCHC: 33.8 g/dL (ref 30.0–36.0)
MCV: 96.3 fL (ref 80.0–100.0)
Platelets: 307 10*3/uL (ref 150–400)
RBC: 4.06 MIL/uL (ref 3.87–5.11)
RDW: 14.2 % (ref 11.5–15.5)
WBC: 5.1 10*3/uL (ref 4.0–10.5)
nRBC: 0 % (ref 0.0–0.2)

## 2021-05-27 LAB — HEMOGLOBIN A1C
Hgb A1c MFr Bld: 6.3 % — ABNORMAL HIGH (ref 4.8–5.6)
Mean Plasma Glucose: 134.11 mg/dL

## 2021-05-27 LAB — COMPREHENSIVE METABOLIC PANEL
ALT: 15 U/L (ref 0–44)
AST: 22 U/L (ref 15–41)
Albumin: 4.2 g/dL (ref 3.5–5.0)
Alkaline Phosphatase: 106 U/L (ref 38–126)
Anion gap: 8 (ref 5–15)
BUN: 12 mg/dL (ref 8–23)
CO2: 26 mmol/L (ref 22–32)
Calcium: 8.8 mg/dL — ABNORMAL LOW (ref 8.9–10.3)
Chloride: 105 mmol/L (ref 98–111)
Creatinine, Ser: 0.66 mg/dL (ref 0.44–1.00)
GFR, Estimated: >60 mL/min (ref >60)
Glucose, Bld: 96 mg/dL (ref 70–99)
Potassium: 3.1 mmol/L — ABNORMAL LOW (ref 3.5–5.1)
Sodium: 139 mmol/L (ref 135–145)
Total Bilirubin: 0.5 mg/dL (ref 0.3–1.2)
Total Protein: 7.4 g/dL (ref 6.5–8.1)

## 2021-05-27 LAB — SURGICAL PCR SCREEN
MRSA, PCR: NEGATIVE
Staphylococcus aureus: NEGATIVE

## 2021-05-27 LAB — TYPE AND SCREEN
ABO/RH(D): B POS
Antibody Screen: NEGATIVE

## 2021-05-27 LAB — APTT: aPTT: 29 seconds (ref 24–36)

## 2021-05-27 LAB — SARS CORONAVIRUS 2 BY RT PCR (HOSPITAL ORDER, PERFORMED IN ~~LOC~~ HOSPITAL LAB): SARS Coronavirus 2: NEGATIVE

## 2021-05-27 LAB — PROTIME-INR
INR: 1 (ref 0.8–1.2)
Prothrombin Time: 12.6 seconds (ref 11.4–15.2)

## 2021-05-27 LAB — GLUCOSE, CAPILLARY
Glucose-Capillary: 101 mg/dL — ABNORMAL HIGH (ref 70–99)
Glucose-Capillary: 132 mg/dL — ABNORMAL HIGH (ref 70–99)
Glucose-Capillary: 143 mg/dL — ABNORMAL HIGH (ref 70–99)

## 2021-05-27 LAB — ABO/RH: ABO/RH(D): B POS

## 2021-05-27 SURGERY — ARTHROPLASTY, HIP, TOTAL, ANTERIOR APPROACH
Anesthesia: Spinal | Site: Hip | Laterality: Right

## 2021-05-27 MED ORDER — INSULIN ASPART 100 UNIT/ML IJ SOLN
0.0000 [IU] | Freq: Three times a day (TID) | INTRAMUSCULAR | Status: DC
  Administered 2021-05-27: 2 [IU] via SUBCUTANEOUS
  Administered 2021-05-28: 3 [IU] via SUBCUTANEOUS
  Administered 2021-05-28 – 2021-05-30 (×4): 2 [IU] via SUBCUTANEOUS

## 2021-05-27 MED ORDER — ROCURONIUM BROMIDE 10 MG/ML (PF) SYRINGE
PREFILLED_SYRINGE | INTRAVENOUS | Status: DC | PRN
  Administered 2021-05-27: 70 mg via INTRAVENOUS

## 2021-05-27 MED ORDER — BUPIVACAINE-EPINEPHRINE 0.25% -1:200000 IJ SOLN
INTRAMUSCULAR | Status: DC | PRN
  Administered 2021-05-27: 30 mL

## 2021-05-27 MED ORDER — TIZANIDINE HCL 4 MG PO TABS
4.0000 mg | ORAL_TABLET | Freq: Three times a day (TID) | ORAL | 1 refills | Status: DC | PRN
  Filled 2021-05-27: qty 30, 10d supply, fill #0

## 2021-05-27 MED ORDER — ZOLPIDEM TARTRATE 5 MG PO TABS: 5.0000 mg | ORAL_TABLET | Freq: Every evening | ORAL | Status: DC | PRN

## 2021-05-27 MED ORDER — BISACODYL 5 MG PO TBEC
5.0000 mg | DELAYED_RELEASE_TABLET | Freq: Every day | ORAL | Status: DC | PRN
  Administered 2021-05-30: 5 mg via ORAL
  Filled 2021-05-27: qty 1

## 2021-05-27 MED ORDER — METHOCARBAMOL 500 MG IVPB - SIMPLE MED
INTRAVENOUS | Status: AC
  Filled 2021-05-27: qty 50

## 2021-05-27 MED ORDER — DOCUSATE SODIUM 100 MG PO CAPS
100.0000 mg | ORAL_CAPSULE | Freq: Two times a day (BID) | ORAL | Status: DC
  Administered 2021-05-27 – 2021-05-30 (×6): 100 mg via ORAL
  Filled 2021-05-27 (×6): qty 1

## 2021-05-27 MED ORDER — FENTANYL CITRATE (PF) 250 MCG/5ML IJ SOLN
INTRAMUSCULAR | Status: DC | PRN
  Administered 2021-05-27: 100 ug via INTRAVENOUS

## 2021-05-27 MED ORDER — OXYCODONE HCL 5 MG PO TABS
5.0000 mg | ORAL_TABLET | ORAL | Status: DC | PRN
  Administered 2021-05-28: 10 mg via ORAL
  Filled 2021-05-27 (×5): qty 2

## 2021-05-27 MED ORDER — GLYCOPYRROLATE 0.2 MG/ML IJ SOLN
INTRAMUSCULAR | Status: DC | PRN
  Administered 2021-05-27: .2 mg via INTRAVENOUS

## 2021-05-27 MED ORDER — GLYCOPYRROLATE 0.2 MG/ML IJ SOLN
INTRAMUSCULAR | Status: AC
  Filled 2021-05-27: qty 1

## 2021-05-27 MED ORDER — ASPIRIN EC 325 MG PO TBEC
325.0000 mg | DELAYED_RELEASE_TABLET | Freq: Two times a day (BID) | ORAL | Status: DC
  Administered 2021-05-28 – 2021-05-30 (×5): 325 mg via ORAL
  Filled 2021-05-27 (×5): qty 1

## 2021-05-27 MED ORDER — HYDROGEN PEROXIDE 3 % EX SOLN
CUTANEOUS | Status: AC
  Filled 2021-05-27: qty 473

## 2021-05-27 MED ORDER — WATER FOR IRRIGATION, STERILE IR SOLN: Status: DC | PRN

## 2021-05-27 MED ORDER — LOSARTAN POTASSIUM 50 MG PO TABS
50.0000 mg | ORAL_TABLET | Freq: Every day | ORAL | Status: DC
  Administered 2021-05-28 – 2021-05-30 (×3): 50 mg via ORAL
  Filled 2021-05-27 (×3): qty 1

## 2021-05-27 MED ORDER — ROCURONIUM BROMIDE 10 MG/ML (PF) SYRINGE
PREFILLED_SYRINGE | INTRAVENOUS | Status: AC
  Filled 2021-05-27: qty 10

## 2021-05-27 MED ORDER — LACTATED RINGERS IV SOLN: INTRAVENOUS | Status: DC

## 2021-05-27 MED ORDER — DEXAMETHASONE SODIUM PHOSPHATE 10 MG/ML IJ SOLN
INTRAMUSCULAR | Status: DC | PRN
  Administered 2021-05-27: 10 mg via INTRAVENOUS

## 2021-05-27 MED ORDER — ORAL CARE MOUTH RINSE: 15.0000 mL | Freq: Once | OROMUCOSAL | Status: AC

## 2021-05-27 MED ORDER — PHENOL 1.4 % MT LIQD: 1.0000 | OROMUCOSAL | Status: DC | PRN

## 2021-05-27 MED ORDER — METOCLOPRAMIDE HCL 5 MG PO TABS
5.0000 mg | ORAL_TABLET | Freq: Three times a day (TID) | ORAL | Status: DC | PRN
  Administered 2021-05-29: 10 mg via ORAL
  Filled 2021-05-27 (×2): qty 1

## 2021-05-27 MED ORDER — CHLORHEXIDINE GLUCONATE 0.12 % MT SOLN
15.0000 mL | Freq: Once | OROMUCOSAL | Status: AC
  Administered 2021-05-27: 15 mL via OROMUCOSAL

## 2021-05-27 MED ORDER — METHOCARBAMOL 500 MG IVPB - SIMPLE MED
500.0000 mg | Freq: Four times a day (QID) | INTRAVENOUS | Status: DC | PRN
  Administered 2021-05-27: 500 mg via INTRAVENOUS
  Filled 2021-05-27: qty 50

## 2021-05-27 MED ORDER — HYDROMORPHONE HCL 2 MG/ML IJ SOLN
INTRAMUSCULAR | Status: AC
  Filled 2021-05-27: qty 1

## 2021-05-27 MED ORDER — FENTANYL CITRATE (PF) 100 MCG/2ML IJ SOLN
INTRAMUSCULAR | Status: AC
  Filled 2021-05-27: qty 2

## 2021-05-27 MED ORDER — TRANEXAMIC ACID-NACL 1000-0.7 MG/100ML-% IV SOLN
1000.0000 mg | INTRAVENOUS | Status: AC
  Administered 2021-05-27: 1000 mg via INTRAVENOUS
  Filled 2021-05-27: qty 100

## 2021-05-27 MED ORDER — HYDROMORPHONE HCL 1 MG/ML IJ SOLN: 0.5000 mg | INTRAMUSCULAR | Status: DC | PRN

## 2021-05-27 MED ORDER — PROPOFOL 500 MG/50ML IV EMUL
INTRAVENOUS | Status: DC | PRN
  Administered 2021-05-27: 175 ug/kg/min via INTRAVENOUS

## 2021-05-27 MED ORDER — SODIUM CHLORIDE 0.9 % IV SOLN: INTRAVENOUS | Status: DC

## 2021-05-27 MED ORDER — DEXAMETHASONE SODIUM PHOSPHATE 10 MG/ML IJ SOLN
INTRAMUSCULAR | Status: AC
  Filled 2021-05-27: qty 1

## 2021-05-27 MED ORDER — LABETALOL HCL 5 MG/ML IV SOLN
INTRAVENOUS | Status: DC | PRN
  Administered 2021-05-27: 2.5 mg via INTRAVENOUS
  Administered 2021-05-27 (×2): 5 mg via INTRAVENOUS

## 2021-05-27 MED ORDER — ACETAMINOPHEN 500 MG PO TABS
1000.0000 mg | ORAL_TABLET | Freq: Four times a day (QID) | ORAL | Status: AC
  Administered 2021-05-27 – 2021-05-28 (×4): 1000 mg via ORAL
  Filled 2021-05-27 (×4): qty 2

## 2021-05-27 MED ORDER — CEFAZOLIN SODIUM-DEXTROSE 1-4 GM/50ML-% IV SOLN
1.0000 g | Freq: Four times a day (QID) | INTRAVENOUS | Status: AC
  Administered 2021-05-27 (×2): 1 g via INTRAVENOUS
  Filled 2021-05-27 (×2): qty 50

## 2021-05-27 MED ORDER — ONDANSETRON HCL 4 MG PO TABS
4.0000 mg | ORAL_TABLET | Freq: Four times a day (QID) | ORAL | Status: DC | PRN
  Administered 2021-05-29: 4 mg via ORAL
  Filled 2021-05-27 (×2): qty 1

## 2021-05-27 MED ORDER — MENTHOL 3 MG MT LOZG: 1.0000 | LOZENGE | OROMUCOSAL | Status: DC | PRN

## 2021-05-27 MED ORDER — LABETALOL HCL 5 MG/ML IV SOLN
INTRAVENOUS | Status: AC
  Filled 2021-05-27: qty 4

## 2021-05-27 MED ORDER — MIDAZOLAM HCL 5 MG/5ML IJ SOLN
INTRAMUSCULAR | Status: DC | PRN
  Administered 2021-05-27: 2 mg via INTRAVENOUS

## 2021-05-27 MED ORDER — OXYCODONE HCL 5 MG PO TABS
10.0000 mg | ORAL_TABLET | ORAL | Status: DC | PRN
Start: 2021-05-27 — End: 2021-05-30
  Administered 2021-05-27 – 2021-05-30 (×9): 10 mg via ORAL
  Filled 2021-05-27 (×5): qty 2

## 2021-05-27 MED ORDER — METHOCARBAMOL 500 MG PO TABS
500.0000 mg | ORAL_TABLET | Freq: Four times a day (QID) | ORAL | Status: DC | PRN
  Administered 2021-05-27 – 2021-05-30 (×8): 500 mg via ORAL
  Filled 2021-05-27 (×8): qty 1

## 2021-05-27 MED ORDER — ONDANSETRON HCL 4 MG/2ML IJ SOLN
4.0000 mg | Freq: Four times a day (QID) | INTRAMUSCULAR | Status: DC | PRN
  Administered 2021-05-27 – 2021-05-28 (×2): 4 mg via INTRAVENOUS
  Filled 2021-05-27 (×2): qty 2

## 2021-05-27 MED ORDER — PROPOFOL 1000 MG/100ML IV EMUL
INTRAVENOUS | Status: AC
  Filled 2021-05-27: qty 100

## 2021-05-27 MED ORDER — MIDAZOLAM HCL 2 MG/2ML IJ SOLN
INTRAMUSCULAR | Status: AC
  Filled 2021-05-27: qty 2

## 2021-05-27 MED ORDER — HYDRALAZINE HCL 20 MG/ML IJ SOLN
INTRAMUSCULAR | Status: DC | PRN
  Administered 2021-05-27: 10 mg via INTRAVENOUS

## 2021-05-27 MED ORDER — ACETAMINOPHEN 10 MG/ML IV SOLN
1000.0000 mg | Freq: Once | INTRAVENOUS | Status: DC | PRN
  Administered 2021-05-27: 1000 mg via INTRAVENOUS

## 2021-05-27 MED ORDER — LOSARTAN POTASSIUM-HCTZ 50-12.5 MG PO TABS: 1.0000 | ORAL_TABLET | Freq: Every day | ORAL | Status: DC

## 2021-05-27 MED ORDER — LIDOCAINE HCL (PF) 2 % IJ SOLN
INTRAMUSCULAR | Status: AC
  Filled 2021-05-27: qty 5

## 2021-05-27 MED ORDER — SUGAMMADEX SODIUM 200 MG/2ML IV SOLN
INTRAVENOUS | Status: DC | PRN
  Administered 2021-05-27: 150 mg via INTRAVENOUS

## 2021-05-27 MED ORDER — METOCLOPRAMIDE HCL 5 MG/ML IJ SOLN: 5.0000 mg | Freq: Three times a day (TID) | INTRAMUSCULAR | Status: DC | PRN

## 2021-05-27 MED ORDER — CEFAZOLIN SODIUM-DEXTROSE 2-4 GM/100ML-% IV SOLN
2.0000 g | Freq: Once | INTRAVENOUS | Status: AC
  Administered 2021-05-27: 2 g via INTRAVENOUS
  Filled 2021-05-27: qty 100

## 2021-05-27 MED ORDER — ALUM & MAG HYDROXIDE-SIMETH 200-200-20 MG/5ML PO SUSP: 30.0000 mL | ORAL | Status: DC | PRN

## 2021-05-27 MED ORDER — ONDANSETRON HCL 4 MG/2ML IJ SOLN
INTRAMUSCULAR | Status: AC
  Filled 2021-05-27: qty 2

## 2021-05-27 MED ORDER — METFORMIN HCL 500 MG PO TABS
500.0000 mg | ORAL_TABLET | Freq: Two times a day (BID) | ORAL | Status: DC
  Administered 2021-05-28 – 2021-05-30 (×5): 500 mg via ORAL
  Filled 2021-05-27 (×5): qty 1

## 2021-05-27 MED ORDER — BUPIVACAINE LIPOSOME 1.3 % IJ SUSP
INTRAMUSCULAR | Status: AC
  Filled 2021-05-27: qty 10

## 2021-05-27 MED ORDER — 0.9 % SODIUM CHLORIDE (POUR BTL) OPTIME
TOPICAL | Status: DC | PRN
  Administered 2021-05-27: 1000 mL

## 2021-05-27 MED ORDER — PROPOFOL 10 MG/ML IV BOLUS
INTRAVENOUS | Status: DC | PRN
  Administered 2021-05-27: 150 mg via INTRAVENOUS
  Administered 2021-05-27: 50 mg via INTRAVENOUS
  Administered 2021-05-27: 150 mg via INTRAVENOUS

## 2021-05-27 MED ORDER — OXYCODONE-ACETAMINOPHEN 5-325 MG PO TABS
ORAL_TABLET | ORAL | 0 refills | Status: DC
  Filled 2021-05-27: qty 30, 5d supply, fill #0

## 2021-05-27 MED ORDER — ACETAMINOPHEN 10 MG/ML IV SOLN
INTRAVENOUS | Status: AC
  Filled 2021-05-27: qty 100

## 2021-05-27 MED ORDER — DOCUSATE SODIUM 100 MG PO CAPS: 100.0000 mg | ORAL_CAPSULE | Freq: Three times a day (TID) | ORAL | 0 refills | Status: DC | PRN

## 2021-05-27 MED ORDER — BUPIVACAINE-EPINEPHRINE (PF) 0.25% -1:200000 IJ SOLN
INTRAMUSCULAR | Status: AC
  Filled 2021-05-27: qty 30

## 2021-05-27 MED ORDER — HYDROCHLOROTHIAZIDE 12.5 MG PO CAPS
12.5000 mg | ORAL_CAPSULE | Freq: Every day | ORAL | Status: DC
  Administered 2021-05-28 – 2021-05-30 (×3): 12.5 mg via ORAL
  Filled 2021-05-27 (×3): qty 1

## 2021-05-27 MED ORDER — ACETAMINOPHEN 325 MG PO TABS
325.0000 mg | ORAL_TABLET | Freq: Four times a day (QID) | ORAL | Status: DC | PRN
Start: 2021-05-28 — End: 2021-05-30

## 2021-05-27 MED ORDER — ASPIRIN EC 325 MG PO TBEC: 325.0000 mg | DELAYED_RELEASE_TABLET | Freq: Two times a day (BID) | ORAL | 0 refills | Status: DC

## 2021-05-27 MED ORDER — OXYCODONE HCL 5 MG/5ML PO SOLN
5.0000 mg | Freq: Once | ORAL | Status: DC | PRN
Start: 2021-05-27 — End: 2021-05-27

## 2021-05-27 MED ORDER — HYDROMORPHONE HCL 1 MG/ML IJ SOLN
0.2500 mg | INTRAMUSCULAR | Status: DC | PRN
  Administered 2021-05-27 (×2): 0.5 mg via INTRAVENOUS

## 2021-05-27 MED ORDER — HYDROMORPHONE HCL 1 MG/ML IJ SOLN
INTRAMUSCULAR | Status: AC
  Filled 2021-05-27: qty 2

## 2021-05-27 MED ORDER — DIPHENHYDRAMINE HCL 12.5 MG/5ML PO ELIX: 12.5000 mg | ORAL_SOLUTION | ORAL | Status: DC | PRN

## 2021-05-27 MED ORDER — QUETIAPINE FUMARATE ER 50 MG PO TB24
150.0000 mg | ORAL_TABLET | Freq: Every day | ORAL | Status: DC
  Administered 2021-05-27 – 2021-05-30 (×4): 150 mg via ORAL
  Filled 2021-05-27 (×4): qty 3

## 2021-05-27 MED ORDER — HYDROMORPHONE HCL 1 MG/ML IJ SOLN
INTRAMUSCULAR | Status: DC | PRN
  Administered 2021-05-27 (×2): 1 mg via INTRAVENOUS

## 2021-05-27 MED ORDER — OXYCODONE HCL 5 MG PO TABS: 5.0000 mg | ORAL_TABLET | Freq: Once | ORAL | Status: DC | PRN

## 2021-05-27 MED ORDER — STERILE WATER FOR IRRIGATION IR SOLN
Status: DC | PRN
  Administered 2021-05-27: 2000 mL

## 2021-05-27 MED ORDER — BUPIVACAINE LIPOSOME 1.3 % IJ SUSP
INTRAMUSCULAR | Status: DC | PRN
  Administered 2021-05-27: 10 mL

## 2021-05-27 MED ORDER — ONDANSETRON HCL 4 MG/2ML IJ SOLN: 4.0000 mg | Freq: Once | INTRAMUSCULAR | Status: DC | PRN

## 2021-05-27 MED ORDER — ONDANSETRON HCL 4 MG/2ML IJ SOLN
INTRAMUSCULAR | Status: DC | PRN
  Administered 2021-05-27: 4 mg via INTRAVENOUS

## 2021-05-27 SURGICAL SUPPLY — 42 items
BAG COUNTER SPONGE SURGICOUNT (BAG) IMPLANT
BAG ZIPLOCK 12X15 (MISCELLANEOUS) IMPLANT
BALL HIP CERAMIC (Hips) ×1 IMPLANT
BENZOIN TINCTURE PRP APPL 2/3 (GAUZE/BANDAGES/DRESSINGS) IMPLANT
BLADE SAW SGTL 18X1.27X75 (BLADE) ×2 IMPLANT
BLADE SURG SZ10 CARB STEEL (BLADE) ×4 IMPLANT
CLSR STERI-STRIP ANTIMIC 1/2X4 (GAUZE/BANDAGES/DRESSINGS) ×2 IMPLANT
COVER PERINEAL POST (MISCELLANEOUS) ×2 IMPLANT
COVER SURGICAL LIGHT HANDLE (MISCELLANEOUS) ×2 IMPLANT
CUP GRIPTON 48MM 100 HIP (Hips) ×2 IMPLANT
DECANTER SPIKE VIAL GLASS SM (MISCELLANEOUS) ×2 IMPLANT
DRAPE FOOT SWITCH (DRAPES) ×2 IMPLANT
DRAPE STERI IOBAN 125X83 (DRAPES) ×2 IMPLANT
DRAPE U-SHAPE 47X51 STRL (DRAPES) ×4 IMPLANT
DRSG AQUACEL AG ADV 3.5X 6 (GAUZE/BANDAGES/DRESSINGS) ×2 IMPLANT
DURAPREP 26ML APPLICATOR (WOUND CARE) ×2 IMPLANT
ELECT BLADE TIP CTD 4 INCH (ELECTRODE) ×2 IMPLANT
ELECT REM PT RETURN 15FT ADLT (MISCELLANEOUS) ×2 IMPLANT
ELIMINATOR HOLE APEX DEPUY (Hips) ×2 IMPLANT
GAUZE XEROFORM 1X8 LF (GAUZE/BANDAGES/DRESSINGS) IMPLANT
GLOVE SRG 8 PF TXTR STRL LF DI (GLOVE) ×2 IMPLANT
GLOVE SURG NEOP MICRO LF SZ7.5 (GLOVE) ×4 IMPLANT
GLOVE SURG UNDER POLY LF SZ8 (GLOVE) ×4
GOWN STRL REUS W/TWL XL LVL3 (GOWN DISPOSABLE) ×4 IMPLANT
HIP BALL CERAMIC (Hips) ×2 IMPLANT
HOLDER FOLEY CATH W/STRAP (MISCELLANEOUS) ×2 IMPLANT
HOOD PEEL AWAY FLYTE STAYCOOL (MISCELLANEOUS) ×6 IMPLANT
KIT TURNOVER KIT A (KITS) ×2 IMPLANT
LINER ACET 32X48 (Liner) ×2 IMPLANT
NEEDLE HYPO 22GX1.5 SAFETY (NEEDLE) ×2 IMPLANT
PACK ANTERIOR HIP CUSTOM (KITS) ×2 IMPLANT
PENCIL SMOKE EVACUATOR (MISCELLANEOUS) ×2 IMPLANT
STAPLER VISISTAT 35W (STAPLE) IMPLANT
STEM CORAIL KA10 (Stem) ×2 IMPLANT
STRIP CLOSURE SKIN 1/2X4 (GAUZE/BANDAGES/DRESSINGS) IMPLANT
SUT ETHIBOND NAB CT1 #1 30IN (SUTURE) ×4 IMPLANT
SUT MNCRL AB 3-0 PS2 18 (SUTURE) ×2 IMPLANT
SUT VIC AB 0 CT1 36 (SUTURE) ×2 IMPLANT
SUT VIC AB 1 CT1 36 (SUTURE) ×2 IMPLANT
SUT VIC AB 2-0 CT1 27 (SUTURE) ×2
SUT VIC AB 2-0 CT1 TAPERPNT 27 (SUTURE) ×1 IMPLANT
TRAY FOLEY MTR SLVR 16FR STAT (SET/KITS/TRAYS/PACK) IMPLANT

## 2021-05-27 NOTE — Discharge Instructions (Signed)
Dental Antibiotics:  In most cases prophylactic antibiotics for Dental procdeures after total joint surgery are not necessary.  Exceptions are as follows:  1. History of prior total joint infection  2. Severely immunocompromised (Organ Transplant, cancer chemotherapy, Rheumatoid biologic meds such as Humera)  3. Poorly controlled diabetes (A1C &gt; 8.0, blood glucose over 200)  If you have one of these conditions, contact your surgeon for an antibiotic prescription, prior to your dental procedure. INSTRUCTIONS AFTER JOINT REPLACEMENT   Remove items at home which could result in a fall. This includes throw rugs or furniture in walking pathways ICE to the affected joint every three hours while awake for 30 minutes at a time, for at least the first 3-5 days, and then as needed for pain and swelling.  Continue to use ice for pain and swelling. You may notice swelling that will progress down to the foot and ankle.  This is normal after surgery.  Elevate your leg when you are not up walking on it.   Continue to use the breathing machine you got in the hospital (incentive spirometer) which will help keep your temperature down.  It is common for your temperature to cycle up and down following surgery, especially at night when you are not up moving around and exerting yourself.  The breathing machine keeps your lungs expanded and your temperature down.   DIET:  As you were doing prior to hospitalization, we recommend a well-balanced diet.  DRESSING / WOUND CARE / SHOWERING  Keep the surgical dressing until follow up.  The dressing is water proof, so you can shower without any extra covering.  IF THE DRESSING FALLS OFF or the wound gets wet inside, change the dressing with sterile gauze.  Please use good hand washing techniques before changing the dressing.  Do not use any lotions or creams on the incision until instructed by your surgeon.    ACTIVITY  Increase activity slowly as tolerated, but  follow the weight bearing instructions below.   No driving for 6 weeks or until further direction given by your physician.  You cannot drive while taking narcotics.  No lifting or carrying greater than 10 lbs. until further directed by your surgeon. Avoid periods of inactivity such as sitting longer than an hour when not asleep. This helps prevent blood clots.  You may return to work once you are authorized by your doctor.     WEIGHT BEARING   Weight bearing as tolerated with assist device (walker, cane, etc) as directed, use it as long as suggested by your surgeon or therapist, typically at least 4-6 weeks.   EXERCISES  Results after joint replacement surgery are often greatly improved when you follow the exercise, range of motion and muscle strengthening exercises prescribed by your doctor. Safety measures are also important to protect the joint from further injury. Any time any of these exercises cause you to have increased pain or swelling, decrease what you are doing until you are comfortable again and then slowly increase them. If you have problems or questions, call your caregiver or physical therapist for advice.   Rehabilitation is important following a joint replacement. After just a few days of immobilization, the muscles of the leg can become weakened and shrink (atrophy).  These exercises are designed to build up the tone and strength of the thigh and leg muscles and to improve motion. Often times heat used for twenty to thirty minutes before working out will loosen up your tissues and help with   improving the range of motion but do not use heat for the first two weeks following surgery (sometimes heat can increase post-operative swelling).   These exercises can be done on a training (exercise) mat, on the floor, on a table or on a bed. Use whatever works the best and is most comfortable for you.    Use music or television while you are exercising so that the exercises are a pleasant  break in your day. This will make your life better with the exercises acting as a break in your routine that you can look forward to.   Perform all exercises about fifteen times, three times per day or as directed.  You should exercise both the operative leg and the other leg as well.  Exercises include:   Quad Sets - Tighten up the muscle on the front of the thigh (Quad) and hold for 5-10 seconds.   Straight Leg Raises - With your knee straight (if you were given a brace, keep it on), lift the leg to 60 degrees, hold for 3 seconds, and slowly lower the leg.  Perform this exercise against resistance later as your leg gets stronger.  Leg Slides: Lying on your back, slowly slide your foot toward your buttocks, bending your knee up off the floor (only go as far as is comfortable). Then slowly slide your foot back down until your leg is flat on the floor again.  Angel Wings: Lying on your back spread your legs to the side as far apart as you can without causing discomfort.  Hamstring Strength:  Lying on your back, push your heel against the floor with your leg straight by tightening up the muscles of your buttocks.  Repeat, but this time bend your knee to a comfortable angle, and push your heel against the floor.  You may put a pillow under the heel to make it more comfortable if necessary.   A rehabilitation program following joint replacement surgery can speed recovery and prevent re-injury in the future due to weakened muscles. Contact your doctor or a physical therapist for more information on knee rehabilitation.    CONSTIPATION  Constipation is defined medically as fewer than three stools per week and severe constipation as less than one stool per week.  Even if you have a regular bowel pattern at home, your normal regimen is likely to be disrupted due to multiple reasons following surgery.  Combination of anesthesia, postoperative narcotics, change in appetite and fluid intake all can affect your  bowels.   YOU MUST use at least one of the following options; they are listed in order of increasing strength to get the job done.  They are all available over the counter, and you may need to use some, POSSIBLY even all of these options:    Drink plenty of fluids (prune juice may be helpful) and high fiber foods Colace 100 mg by mouth twice a day  Senokot for constipation as directed and as needed Dulcolax (bisacodyl), take with full glass of water  Miralax (polyethylene glycol) once or twice a day as needed.  If you have tried all these things and are unable to have a bowel movement in the first 3-4 days after surgery call either your surgeon or your primary doctor.    If you experience loose stools or diarrhea, hold the medications until you stool forms back up.  If your symptoms do not get better within 1 week or if they get worse, check with your doctor.    If you experience "the worst abdominal pain ever" or develop nausea or vomiting, please contact the office immediately for further recommendations for treatment.   ITCHING:  If you experience itching with your medications, try taking only a single pain pill, or even half a pain pill at a time.  You can also use Benadryl over the counter for itching or also to help with sleep.   TED HOSE STOCKINGS:  Use stockings on both legs until for at least 2 weeks or as directed by physician office. They may be removed at night for sleeping.  MEDICATIONS:  See your medication summary on the "After Visit Summary" that nursing will review with you.  You may have some home medications which will be placed on hold until you complete the course of blood thinner medication.  It is important for you to complete the blood thinner medication as prescribed.  PRECAUTIONS:  If you experience chest pain or shortness of breath - call 911 immediately for transfer to the hospital emergency department.   If you develop a fever greater that 101 F, purulent drainage  from wound, increased redness or drainage from wound, foul odor from the wound/dressing, or calf pain - CONTACT YOUR SURGEON.                                                   FOLLOW-UP APPOINTMENTS:  If you do not already have a post-op appointment, please call the office for an appointment to be seen by your surgeon.  Guidelines for how soon to be seen are listed in your "After Visit Summary", but are typically between 1-4 weeks after surgery.  OTHER INSTRUCTIONS:   Knee Replacement:  Do not place pillow under knee, focus on keeping the knee straight while resting. CPM instructions: 0-90 degrees, 2 hours in the morning, 2 hours in the afternoon, and 2 hours in the evening. Place foam block, curve side up under heel at all times except when in CPM or when walking.  DO NOT modify, tear, cut, or change the foam block in any way.  POST-OPERATIVE OPIOID TAPER INSTRUCTIONS: It is important to wean off of your opioid medication as soon as possible. If you do not need pain medication after your surgery it is ok to stop day one. Opioids include: Codeine, Hydrocodone(Norco, Vicodin), Oxycodone(Percocet, oxycontin) and hydromorphone amongst others.  Long term and even short term use of opiods can cause: Increased pain response Dependence Constipation Depression Respiratory depression And more.  Withdrawal symptoms can include Flu like symptoms Nausea, vomiting And more Techniques to manage these symptoms Hydrate well Eat regular healthy meals Stay active Use relaxation techniques(deep breathing, meditating, yoga) Do Not substitute Alcohol to help with tapering If you have been on opioids for less than two weeks and do not have pain than it is ok to stop all together.  Plan to wean off of opioids This plan should start within one week post op of your joint replacement. Maintain the same interval or time between taking each dose and first decrease the dose.  Cut the total daily intake of  opioids by one tablet each day Next start to increase the time between doses. The last dose that should be eliminated is the evening dose.   MAKE SURE YOU:  Understand these instructions.  Get help right away if you are not doing   well or get worse.    Thank you for letting us be a part of your medical care team.  It is a privilege we respect greatly.  We hope these instructions will help you stay on track for a fast and full recovery!       

## 2021-05-27 NOTE — H&P (Signed)
TOTAL HIP ADMISSION H&P  Patient is admitted for right total hip arthroplasty.  Subjective:  Chief Complaint: right hip pain  HPI: Gina Richards, 60 y.o. female, has a history of pain and functional disability in the right hip(s) due to arthritis and patient has failed non-surgical conservative treatments for greater than 12 weeks to include NSAID's and/or analgesics, use of assistive devices, and activity modification.  Onset of symptoms was gradual starting 5 years ago with gradually worsening course since that time.The patient noted no past surgery on the right hip(s).  Patient currently rates pain in the right hip at 8 out of 10 with activity. Patient has night pain, worsening of pain with activity and weight bearing, trendelenberg gait, pain that interfers with activities of daily living, and joint swelling. Patient has evidence of subchondral cysts, periarticular osteophytes, joint subluxation, and joint space narrowing by imaging studies. This condition presents safety issues increasing the risk of falls. This patient has had  failure of all reasonable conservative care .  There is no current active infection.  Patient Active Problem List   Diagnosis Date Noted   Cocaine abuse with cocaine-induced mood disorder (HCC) 07/10/2017   Cannabis use disorder, severe, dependence (HCC) 07/10/2017   Past Medical History:  Diagnosis Date   Anxiety    Bipolar 1 disorder (HCC)    Depression    Diabetes mellitus without complication (HCC)    Hypertension    Pneumonia    Stroke [facility])     History reviewed. No pertinent surgical history.  Current Facility-Administered Medications  Medication Dose Route Frequency Provider Last Rate Last Admin   0.9 % irrigation (POUR BTL)    PRN Jodi Geralds, MD   1,000 mL at 05/27/21 0727   ceFAZolin (ANCEF) IVPB 2g/100 mL premix  2 g Intravenous Once Jodi Geralds, MD       lactated ringers infusion   Intravenous Continuous Stoltzfus, Nelle Don, DO 10 mL/hr at  05/27/21 1610 New Bag at 05/27/21 9604   sterile water for irrigation for irrigation    PRN Jodi Geralds, MD   2,000 mL at 05/27/21 5409   tranexamic acid (CYKLOKAPRON) IVPB 1,000 mg  1,000 mg Intravenous To OR Jodi Geralds, MD       water for irrigation, sterile for irrigation SOLN    PRN Jodi Geralds, MD       No Known Allergies  Social History   Tobacco Use   Smoking status: Every Day    Packs/day: 0.50    Types: Cigarettes   Smokeless tobacco: Never  Substance Use Topics   Alcohol use: Yes    Comment: One beer but she will not inform how often.     Family History  Family history unknown: Yes     Review of Systems ROS: I have reviewed the patient's review of systems thoroughly and there are no positive responses as relates to the HPI.  Objective:  Physical Exam  Vital signs in last 24 hours: Temp:  [98 F (36.7 C)] 98 F (36.7 C) (10/07 0609) Pulse Rate:  [86] 86 (10/07 0609) Resp:  [16] 16 (10/07 0609) BP: (171)/(108) 171/108 (10/07 0609) SpO2:  [98 %] 98 % (10/07 0609) Weight:  [68 kg] 68 kg (10/07 0614) Well-developed well-nourished patient in no acute distress. Alert and oriented x3 HEENT:within normal limits Cardiac: Regular rate and rhythm Pulmonary: Lungs clear to auscultation Abdomen: Soft and nontender.  Normal active bowel sounds  Musculoskeletal: () r hip painful rom limited rom  Labs: Recent Results (from the past 2160 hour(s))  SARS Coronavirus 2 by RT PCR (hospital order, performed in [facility] hospital lab) Nasopharyngeal Nasopharyngeal Swab     Status: None   Collection Time: 05/27/21  5:48 AM   Specimen: Nasopharyngeal Swab  Result Value Ref Range   SARS Coronavirus 2 NEGATIVE NEGATIVE    Comment: (NOTE) SARS-CoV-2 target nucleic acids are NOT DETECTED.  The SARS-CoV-2 RNA is generally detectable in upper and lower respiratory specimens during the acute phase of infection. The lowest concentration of SARS-CoV-2 viral copies this assay can  detect is 250 copies / mL. A negative result does not preclude SARS-CoV-2 infection and should not be used as the sole basis for treatment or other patient management decisions.  A negative result may occur with improper specimen collection / handling, submission of specimen other than nasopharyngeal swab, presence of viral mutation(s) within the areas targeted by this assay, and inadequate number of viral copies (<250 copies / mL). A negative result must be combined with clinical observations, patient history, and epidemiological information.  Fact Sheet for Patients:   BoilerBrush.com.cy  Fact Sheet for Healthcare Providers: https://pope.com/  This test is not yet approved or  cleared by the [place] FDA and has been authorized for detection and/or diagnosis of SARS-CoV-2 by FDA under an Emergency Use Authorization (EUA).  This EUA will remain in effect (meaning this test can be used) for the duration of the COVID-19 declaration under Section 564(b)(1) of the Act, 21 U.S.C. section 360bbb-3(b)(1), unless the authorization is terminated or revoked sooner.  Performed at [facility], [address] [place], [place] [address]   Glucose, capillary     Status: Abnormal   Collection Time: 05/27/21  6:14 AM  Result Value Ref Range   Glucose-Capillary 101 (H) 70 - 99 mg/dL    Comment: Glucose reference range applies only to samples taken after fasting for at least 8 hours.  CBC per protocol     Status: None   Collection Time: 05/27/21  6:46 AM  Result Value Ref Range   WBC 5.1 4.0 - 10.5 K/uL   RBC 4.06 3.87 - 5.11 MIL/uL   Hemoglobin 13.2 12.0 - 15.0 g/dL   HCT 10.2 58.5 - 27.7 %   MCV 96.3 80.0 - 100.0 fL   MCH 32.5 26.0 - 34.0 pg   MCHC 33.8 30.0 - 36.0 g/dL   RDW 82.4 23.5 - 36.1 %   Platelets 307 150 - 400 K/uL   nRBC 0.0 0.0 - 0.2 %    Comment: Performed at [facility], [address], [place], [place] [address]  Comprehensive metabolic panel per protocol     Status: Abnormal   Collection Time: 05/27/21  6:46 AM  Result Value Ref Range   Sodium 139 135 - 145 mmol/L   Potassium 3.1 (L) 3.5 - 5.1 mmol/L   Chloride 105 98 - 111 mmol/L   CO2 26 22 - 32 mmol/L   Glucose, Bld 96 70 - 99 mg/dL    Comment: Glucose reference range applies only to samples taken after fasting for at least 8 hours.   BUN 12 8 - 23 mg/dL   Creatinine, Ser 4.00 0.44 - 1.00 mg/dL   Calcium 8.8 (L) 8.9 - 10.3 mg/dL   Total Protein 7.4 6.5 - 8.1 g/dL   Albumin 4.2 3.5 - 5.0 g/dL   AST 22 15 - 41 U/L   ALT 15 0 - 44 U/L  Alkaline Phosphatase 106 38 - 126 U/L   Total Bilirubin 0.5 0.3 - 1.2 mg/dL   GFR, Estimated >82 >99 mL/min    Comment: (NOTE) Calculated using the CKD-EPI Creatinine Equation (2021)    Anion gap 8 5 - 15    Comment: Performed at [facility], [address], [place], [place] [address]  APTT     Status: None   Collection Time: 05/27/21  6:46 AM  Result Value Ref Range   aPTT 29 24 - 36 seconds    Comment: Performed at [facility], [address], [place], [place] [address]  Protime-INR     Status: None   Collection Time: 05/27/21  6:46 AM  Result Value Ref Range   Prothrombin Time 12.6 11.4 - 15.2 seconds   INR 1.0 0.8 - 1.2    Comment: (NOTE) INR goal varies based on device and disease states. Performed at [facility], [address] [place], [place] [address]      Estimated body mass index is 27.44 kg/m as calculated from the following:   Height as of this encounter: 5\' 2"  (1.575 m).   Weight as of this encounter: 68 kg.   Imaging Review Plain radiographs demonstrate severe degenerative joint disease of the right hip(s). The bone quality appears to be fair for age and reported activity level.      Assessment/Plan:  End stage arthritis, right hip(s)  The patient history, physical  examination, clinical judgement of the provider and imaging studies are consistent with end stage degenerative joint disease of the right hip(s) and total hip arthroplasty is deemed medically necessary. The treatment options including medical management, injection therapy, arthroscopy and arthroplasty were discussed at length. The risks and benefits of total hip arthroplasty were presented and reviewed. The risks due to aseptic loosening, infection, stiffness, dislocation/subluxation,  thromboembolic complications and other imponderables were discussed.  The patient acknowledged the explanation, agreed to proceed with the plan and consent was signed. Patient is being admitted for inpatient treatment for surgery, pain control, PT, OT, prophylactic antibiotics, VTE prophylaxis, progressive ambulation and ADL's and discharge planning.The patient is planning to be discharged home with home health services

## 2021-05-27 NOTE — Plan of Care (Signed)
  Problem: Clinical Measurements: Goal: Ability to maintain clinical measurements within normal limits will improve Outcome: Progressing   Problem: Activity: Goal: Risk for activity intolerance will decrease Outcome: Progressing   Problem: Pain Managment: Goal: General experience of comfort will improve Outcome: Progressing   Problem: Safety: Goal: Ability to remain free from injury will improve Outcome: Progressing   

## 2021-05-27 NOTE — Anesthesia Postprocedure Evaluation (Signed)
Anesthesia Post Note  Patient: Gina Richards  Procedure(s) Performed: TOTAL HIP ARTHROPLASTY ANTERIOR APPROACH (Right: Hip)     Patient location during evaluation: PACU Anesthesia Type: General Level of consciousness: awake and alert Pain management: pain level controlled Vital Signs Assessment: post-procedure vital signs reviewed and stable Respiratory status: spontaneous breathing, nonlabored ventilation, respiratory function stable and patient connected to nasal cannula oxygen Cardiovascular status: blood pressure returned to baseline and stable Postop Assessment: no apparent nausea or vomiting Anesthetic complications: no   No notable events documented.  Last Vitals:  Vitals:   05/27/21 0953 05/27/21 1000  BP: 118/81 125/81  Pulse: 88 88  Resp: 15 13  Temp: 37.2 C   SpO2: 99% 98%    Last Pain:  Vitals:   05/27/21 1000  TempSrc:   PainSc: Asleep                 Dywane Peruski S

## 2021-05-27 NOTE — Op Note (Signed)
PATIENT ID:      Gina Richards  MRN:     [id number] DOB/AGE:    [birth year] / 60 y.o.       OPERATIVE REPORT    DATE OF PROCEDURE:  05/27/2021       PREOPERATIVE DIAGNOSIS:  RIGHT HIP DEGENERATIVE JOINT DISEASE                                                       Estimated body mass index is 27.44 kg/m as calculated from the following:   Height as of this encounter: 5\' 2"  (1.575 m).   Weight as of this encounter: 68 kg.     POSTOPERATIVE DIAGNOSIS:  RIGHT HIP DEGENERATIVE JOINT DISEASE                                                           PROCEDURE:  1. r total hip arthroplasty using a 48 mm DePuy gription cup  Cup, Apex Hole Eliminator,  neutral liner, a +5 32 mm ceramic head,  and a #10  Corail stem, 2.interpretation of multiple intraoperative fluoroscopic images   SURGEON:    ASSISTANT:   Harvie Junior 60 PA-C  (present throughout entire procedure and necessary for timely completion of the procedure)  ANESTHESIA: general  BLOOD LOSS: 650 Tranexamic Acid: 1 gram IV DRAINS: None COMPLICATIONS: None    NDICATIONS FOR PROCEDURE:Patient with end-stage arthritis of the right hip.  X-rays show bone-on-bone arthritic changes. Despite conservative measures with observation, anti-inflammatory medicine, narcotics, use of a cane, has severe unremitting pain and can ambulate only less than 1 block before resting.  Patient desires elective right total hip arthroplasty to decrease pain and increase function. The risks, benefits, and alternatives were discussed at length including but not limited to the risks of infection, bleeding, nerve injury, stiffness, blood clots, the need for revision surgery, cardiopulmonary complications, among others, and they were willing to proceed.Benefits have been discussed. Questions answered.     PROCEDURE IN DETAIL: The patient was identified by armband,  received preoperative IV antibiotics in the holding area at [facility], taken to the operating room , appropriate anesthetic monitors  were attached and spinal anesthesia was induced.  The patient was placed onto the hot bed and all bony prominences were well-padded.The right hip was prepped and draped for an anterior approach to the hip.  An incision was made and the subcutaneous dissection was down to the level of the tensor fascia.  The fascia was opened and finger dissected.  The bleeders coming across the anterior portion of the hip were identified and cauterized. Retractors were put in place above and below the femoral neck.  The capsule was opened and tagged and a provisional neck cut was made.  The head was removed and sized on the back table.  The acetabulum was sequentially reamed to a level of 63mm and a 22mm gription pinnacle cup was hammered into place with 45 of lateral opening and 30 of anteversion.fluoroscopy was used to ensure this position of the cup.  Attention was turned towards the femur where the leg was  actually rotated, extended, and adduction did.  The femur was sequentially broached until a size of 10 broach gave a perfect fit and fill.at this point a  5 mm delta ceramic hip ball was placed and the hip reduced.  Fluoroscopic images were taken to assess the leg length, fit and fill of the stem, and cup position.  We were happy with the construct at this point.  The 10 broach was removed and a final Corrail stem with standard offset  and a 89mm ceramic hip ball was placed and reduced.  Final images were taken to make certain there were happy with the position at this point.   The capsule was closed with #1 Vicryl suture.  The tensor fascia was closed with 0 Vicryl suture.  The skin was then closed with combination of 0 and 2-0 Vicryl suture.  The top layer was with 3-0 Monocryl suture.  Benzoin and Steri-Strips were applied  and a sterile compressive dressing was applied and the patient taken to recovery room she noted be in satisfactory  condition.  Past medical Motion for the procedure was approximately 300 cc.  Of note Barkley Boards was with the entire case and assisted by retraction of tissues, manipulation of the leg, and closing the minimize or time.    Harvie Junior 02/13/2021, 6:15 PM  Harvie Junior 05/27/2021, 9:31 AM

## 2021-05-27 NOTE — Anesthesia Procedure Notes (Signed)
Procedure Name: Intubation Date/Time: 05/27/2021 7:44 AM Performed by: Rojelio Uhrich D, CRNA Pre-anesthesia Checklist: Patient identified, Emergency Drugs available, Suction available and Patient being monitored Patient Re-evaluated:Patient Re-evaluated prior to induction Oxygen Delivery Method: Circle system utilized Preoxygenation: Pre-oxygenation with 100% oxygen Induction Type: IV induction Ventilation: Mask ventilation without difficulty Laryngoscope Size: Mac and 3 Grade View: Grade I Tube type: Oral Tube size: 7.0 mm Number of attempts: 1 Airway Equipment and Method: Stylet Placement Confirmation: ETT inserted through vocal cords under direct vision, positive ETCO2 and breath sounds checked- equal and bilateral Secured at: 21 cm Tube secured with: Tape Dental Injury: Teeth and Oropharynx as per pre-operative assessment

## 2021-05-27 NOTE — Evaluation (Signed)
Physical Therapy Evaluation Patient Details Name: Gina Richards MRN: [id number] DOB: 1961-02-14 Today's Date: 05/27/2021  History of Present Illness  Pt is 60 yo female s/p R anterior THA on 05/27/21.  She has hx including anxiety, bipolar, depression, DM, HTN, CVA.  Clinical Impression  Pt is s/p THA resulting in the deficits listed below (see PT Problem List). At baseline, pt lives alone and is independent with AD.  She has no support at home for post-op.  Today, pt requiring min A for transfers and ambulated 120' with RW and min guard at slow speed.  She has a rollator at home that she prefers, discussed benefits of RW over rollator post-op but she is worried about cost and reports she utilizes seat of rollator.  Largest barrier to d/c is lack of support/no support.  Otherwise, expected to progress well.  Pt will benefit from skilled PT to increase their independence and safety with mobility to allow discharge to the venue listed below.         Recommendations for follow up therapy are one component of a multi-disciplinary discharge planning process, led by the attending physician.  Recommendations may be updated based on patient status, additional functional criteria and insurance authorization.  Follow Up Recommendations Follow surgeon's recommendation for DC plan and follow-up therapies;Supervision for mobility/OOB (Of note pt with NO support at home)    Equipment Recommendations   (Possible RW - has rollator, wants to know cost of RW first)    Recommendations for Other Services       Precautions / Restrictions Precautions Precautions: Fall Restrictions Weight Bearing Restrictions: Yes RLE Weight Bearing: Weight bearing as tolerated      Mobility  Bed Mobility Overal bed mobility: Needs Assistance Bed Mobility: Supine to Sit;Sit to Supine     Supine to sit: Min assist Sit to supine: Min assist   General bed mobility comments: Min A for R LE with HOB elevated and  increased time.  Cues for bed mobility    Transfers Overall transfer level: Needs assistance Equipment used: Rolling walker (2 wheeled) Transfers: Sit to/from Stand Sit to Stand: Min assist         General transfer comment: Light min A to rise with cues for hand placement and R LE management.  Ambulation/Gait Ambulation/Gait assistance: Min guard Gait Distance (Feet): 120 Feet Assistive device: Rolling walker (2 wheeled) Gait Pattern/deviations: Step-to pattern;Decreased stride length;Decreased weight shift to right Gait velocity: decreased   General Gait Details: Cues for sequencing and RW use/proximity. Started with advancing L LE and pulling R LE behind but able to progress to slight advancement of R LE  Stairs            Wheelchair Mobility    Modified Rankin (Stroke Patients Only)       Balance Overall balance assessment: Needs assistance Sitting-balance support: No upper extremity supported Sitting balance-Leahy Scale: Good     Standing balance support: Bilateral upper extremity supported Standing balance-Leahy Scale: Poor Standing balance comment: requiring RW                             Pertinent Vitals/Pain Pain Assessment: 0-10 Pain Score: 8  Pain Location: R hip Pain Descriptors / Indicators: Discomfort;Sore Pain Intervention(s): Limited activity within patient's tolerance;Monitored during session;Premedicated before session;Ice applied (improved with walking)    Home Living Family/patient expects to be discharged to:: Private residence Living Arrangements: Alone Available Help at Discharge: Other (Comment) (  reports essentially on her own) Type of Home: Apartment Home Access: Level entry     Home Layout: One level Home Equipment: Cane - single point;Walker - 4 wheels Additional Comments: life alert    Prior Function Level of Independence: Independent with assistive device(s)   Gait / Transfers Assistance Needed: Use RW or  cane depending on the day and how her hip felt  ADL's / Homemaking Assistance Needed: Uses rollator in home, when cooking or needs to carry something uses seat to carry items  Comments: Independent with ADLs, IADLs, community ambulation with AD, but does not drive.     Hand Dominance        Extremity/Trunk Assessment   Upper Extremity Assessment Upper Extremity Assessment: Overall WFL for tasks assessed    Lower Extremity Assessment Lower Extremity Assessment: RLE deficits/detail RLE Deficits / Details: Expected post op changes; ROM WFL; MMT: ankle 5/5, knee 3/5, hip 1/5    Cervical / Trunk Assessment Cervical / Trunk Assessment: Normal  Communication   Communication: No difficulties  Cognition Arousal/Alertness: Awake/alert Behavior During Therapy: WFL for tasks assessed/performed Overall Cognitive Status: Within Functional Limits for tasks assessed                                        General Comments  Educated on recommendation for assist at home with mobility and for ADLS/IADLs.  She reports she will not have assist other than someone driving her.  Shown ADL kit online.  Also, asked how she would carry food etc if using RW.  At that time reports her walker is rollator not RW and she normally sets stuff on the seat.    Applied ice pack.  Pt asked about heat-discussed don't recommend heat with acute injury/surgery b/c increase in edema.      Exercises Total Joint Exercises Ankle Circles/Pumps: AROM;Both;5 reps;Supine   Assessment/Plan    PT Assessment Patient needs continued PT services  PT Problem List Decreased strength;Decreased mobility;Decreased safety awareness;Decreased range of motion;Decreased activity tolerance;Decreased balance;Decreased knowledge of use of DME;Pain       PT Treatment Interventions DME instruction;Therapeutic activities;Modalities;Gait training;Therapeutic exercise;Patient/family education;Stair training;Balance  training;Functional mobility training    PT Goals (Current goals can be found in the Care Plan section)  Acute Rehab PT Goals Patient Stated Goal: return home PT Goal Formulation: With patient Time For Goal Achievement: 06/10/21 Potential to Achieve Goals: Good    Frequency 7X/week   Barriers to discharge Decreased caregiver support      Co-evaluation               AM-PAC PT "6 Clicks" Mobility  Outcome Measure Help needed turning from your back to your side while in a flat bed without using bedrails?: A Little Help needed moving from lying on your back to sitting on the side of a flat bed without using bedrails?: A Little Help needed moving to and from a bed to a chair (including a wheelchair)?: A Little Help needed standing up from a chair using your arms (e.g., wheelchair or bedside chair)?: A Little Help needed to walk in hospital room?: A Little Help needed climbing 3-5 steps with a railing? : A Little 6 Click Score: 18    End of Session Equipment Utilized During Treatment: Gait belt Activity Tolerance: Patient tolerated treatment well Patient left: in bed;with call bell/phone within reach;with bed alarm set;with SCD's reapplied Nurse Communication:  Mobility status PT Visit Diagnosis: Unsteadiness on feet (R26.81);Muscle weakness (generalized) (M62.81);Pain Pain - Right/Left: Right Pain - part of body: Hip    Time: 1700-1733 PT Time Calculation (min) (ACUTE ONLY): 33 min   Charges:   PT Evaluation $PT Eval Low Complexity: 1 Low PT Treatments $Gait Training: 8-22 mins        Abran Richard, PT Acute Rehab Services Pager [phone] [facility] [phone]   Karlton Lemon 05/27/2021, 5:51 PM

## 2021-05-27 NOTE — Transfer of Care (Signed)
Immediate Anesthesia Transfer of Care Note  Patient: Gina Richards  Procedure(s) Performed: TOTAL HIP ARTHROPLASTY ANTERIOR APPROACH (Right: Hip)  Patient Location: PACU  Anesthesia Type:General  Level of Consciousness: awake, alert  and oriented  Airway & Oxygen Therapy: Patient Spontanous Breathing and Patient connected to face mask oxygen  Post-op Assessment: Report given to RN and Post -op Vital signs reviewed and stable  Post vital signs: Reviewed and stable  Last Vitals:  Vitals Value Taken Time  BP 118/81 05/27/21 0953  Temp    Pulse 88 05/27/21 0952  Resp 14 05/27/21 0954  SpO2 99 % 05/27/21 0952  Vitals shown include unvalidated device data.  Last Pain:  Vitals:   05/27/21 0609  TempSrc: Oral      Patients Stated Pain Goal: 8 (05/27/21 6144)  Complications: No notable events documented.

## 2021-05-28 DIAGNOSIS — M1611 Unilateral primary osteoarthritis, right hip: Secondary | ICD-10-CM | POA: Diagnosis not present

## 2021-05-28 LAB — GLUCOSE, CAPILLARY
Glucose-Capillary: 177 mg/dL — ABNORMAL HIGH (ref 70–99)
Glucose-Capillary: 130 mg/dL — ABNORMAL HIGH (ref 70–99)
Glucose-Capillary: 123 mg/dL — ABNORMAL HIGH (ref 70–99)
Glucose-Capillary: 178 mg/dL — ABNORMAL HIGH (ref 70–99)
Glucose-Capillary: 131 mg/dL — ABNORMAL HIGH (ref 70–99)

## 2021-05-28 LAB — BASIC METABOLIC PANEL
Anion gap: 8 (ref 5–15)
BUN: 9 mg/dL (ref 8–23)
CO2: 25 mmol/L (ref 22–32)
Calcium: 8.2 mg/dL — ABNORMAL LOW (ref 8.9–10.3)
Chloride: 102 mmol/L (ref 98–111)
Creatinine, Ser: 0.67 mg/dL (ref 0.44–1.00)
GFR, Estimated: >60 mL/min (ref >60)
Glucose, Bld: 229 mg/dL — ABNORMAL HIGH (ref 70–99)
Potassium: 3.4 mmol/L — ABNORMAL LOW (ref 3.5–5.1)
Sodium: 135 mmol/L (ref 135–145)

## 2021-05-28 LAB — CBC
HCT: 28.1 % — ABNORMAL LOW (ref 36.0–46.0)
Hemoglobin: 9.4 g/dL — ABNORMAL LOW (ref 12.0–15.0)
MCH: 31.6 pg (ref 26.0–34.0)
MCHC: 33.5 g/dL (ref 30.0–36.0)
MCV: 94.6 fL (ref 80.0–100.0)
Platelets: 239 10*3/uL (ref 150–400)
RBC: 2.97 MIL/uL — ABNORMAL LOW (ref 3.87–5.11)
RDW: 14.1 % (ref 11.5–15.5)
WBC: 11.1 10*3/uL — ABNORMAL HIGH (ref 4.0–10.5)
nRBC: 0 % (ref 0.0–0.2)

## 2021-05-28 NOTE — Progress Notes (Signed)
Physical Therapy Treatment Patient Details Name: Gina Richards MRN: [id number] DOB: 11-20-1960 Today's Date: 05/28/2021   History of Present Illness Pt is 60 yo female s/p R anterior THA on 05/27/21.  She has hx including anxiety, bipolar, depression, DM, HTN, CVA.    PT Comments    Pt with reported increased pain levels earlier this afternoon requesting PT to come back. PT returning later and pt with continued reports of increased pain levels, but agreeable to bed level there ex. PT treatment session focused on review of LE ther ex for promotion of improved circulation, strength, and ROM, pt demonstrating understanding and provided handout. Pt will benefit from continued skilled PT to increase independence and maximize safety with mobility.     Recommendations for follow up therapy are one component of a multi-disciplinary discharge planning process, led by the attending physician.  Recommendations may be updated based on patient status, additional functional criteria and insurance authorization.  Follow Up Recommendations  Follow surgeon's recommendation for DC plan and follow-up therapies;Supervision for mobility/OOB (of note, pt has no support at home, lives alone)     Equipment Recommendations       Recommendations for Other Services       Precautions / Restrictions Precautions Precautions: Fall Restrictions Weight Bearing Restrictions: No RLE Weight Bearing: Weight bearing as tolerated     Mobility  Bed Mobility               General bed mobility comments: Pt still reporting increased pain levels 7/10, agreeable to perform bed level ther ex.    Transfers                    Ambulation/Gait                 Stairs             Wheelchair Mobility    Modified Rankin (Stroke Patients Only)       Balance                                            Cognition Arousal/Alertness: Awake/alert Behavior During Therapy:  WFL for tasks assessed/performed Overall Cognitive Status: Within Functional Limits for tasks assessed                                        Exercises Total Joint Exercises Ankle Circles/Pumps: AROM;Both;20 reps;Seated Quad Sets: AROM;Right;10 reps;Supine Short Arc Quad: AROM;Right;10 reps;Supine Heel Slides: AAROM;Right;10 reps;Supine Hip ABduction/ADduction: AROM;Right;10 reps;Supine    General Comments        Pertinent Vitals/Pain Pain Assessment: 0-10 Pain Score: 7  Pain Location: R hip Pain Descriptors / Indicators: Aching;Sore Pain Intervention(s): Limited activity within patient's tolerance;Monitored during session;Repositioned;Ice applied    Home Living                      Prior Function            PT Goals (current goals can now be found in the care plan section) Acute Rehab PT Goals Patient Stated Goal: return home PT Goal Formulation: With patient Time For Goal Achievement: 06/10/21 Potential to Achieve Goals: Good Progress towards PT goals: Progressing toward goals    Frequency    7X/week  PT Plan Current plan remains appropriate    Co-evaluation              AM-PAC PT "6 Clicks" Mobility   Outcome Measure  Help needed turning from your back to your side while in a flat bed without using bedrails?: None Help needed moving from lying on your back to sitting on the side of a flat bed without using bedrails?: None Help needed moving to and from a bed to a chair (including a wheelchair)?: A Little Help needed standing up from a chair using your arms (e.g., wheelchair or bedside chair)?: A Little Help needed to walk in hospital room?: A Little Help needed climbing 3-5 steps with a railing? : A Little 6 Click Score: 20    End of Session   Activity Tolerance: Patient tolerated treatment well Patient left: in bed;with call bell/phone within reach;with bed alarm set Nurse Communication: Mobility status PT Visit  Diagnosis: Unsteadiness on feet (R26.81);Muscle weakness (generalized) (M62.81);Pain Pain - Right/Left: Right Pain - part of body: Hip     Time: 6945-0388 PT Time Calculation (min) (ACUTE ONLY): 19 min  Charges:  $Therapeutic Exercise: 8-22 mins                     Lyman Speller PT, DPT  Acute Rehabilitation Services  Office [phone]   05/28/2021, 4:11 PM

## 2021-05-28 NOTE — Care Management CC44 (Signed)
Condition Code 44 Documentation Completed  Patient Details  Name: ANARA COWMAN MRN: [id number] Date of Birth: [birth year]   Condition Code 44 given:  Yes Patient signature on Condition Code 44 notice:  Yes Documentation of 2 MD's agreement:  Yes Code 44 added to claim:  Yes    Lorri Frederick, LCSW 05/28/2021, 11:55 AM

## 2021-05-28 NOTE — Plan of Care (Signed)
  Problem: Clinical Measurements: Goal: Ability to maintain clinical measurements within normal limits will improve Outcome: Progressing   Problem: Activity: Goal: Risk for activity intolerance will decrease Outcome: Progressing   Problem: Pain Managment: Goal: General experience of comfort will improve Outcome: Progressing   

## 2021-05-28 NOTE — Progress Notes (Signed)
PATIENT ID: Gina Richards  MRN: [id number]  DOB/AGE:  February 04, 1961 / 60 y.o.  1 Day Post-Op Procedure(s) (LRB): TOTAL HIP ARTHROPLASTY ANTERIOR APPROACH (Right)    PROGRESS NOTE Subjective: Patient is alert, oriented, no Nausea, no Vomiting, yes passing gas, . Taking PO well. Denies SOB, Chest or Calf Pain. Using Incentive Spirometer, PAS in place. Ambulate WBAT with pt walking 120 ft with therapy Patient reports pain as  moderate .    Objective: Vital signs in last 24 hours: Vitals:   05/27/21 1943 05/27/21 2245 05/28/21 0124 05/28/21 0518  BP: 129/82 (!) 143/84 112/84 119/80  Pulse: 77 89 92 89  Resp: 16 16 16 18   Temp: 98.1 F (36.7 C) 98.1 F (36.7 C) 98.4 F (36.9 C) 98 F (36.7 C)  TempSrc: Oral Oral Oral Oral  SpO2: 96% 99% 96% 99%  Weight:      Height:          Intake/Output from previous day: I/O last 3 completed shifts: In: 4110.4 [P.O.:720; I.V.:3290.4; IV Piggyback:100] Out: 1600 [Urine:1000; Blood:600]   Intake/Output this shift: No intake/output data recorded.   LABORATORY DATA: Recent Labs    05/27/21 0646 05/27/21 1007 05/27/21 1641 05/28/21 0136 05/28/21 0316 05/28/21 0731  WBC 5.1  --   --   --   --   --   HGB 13.2  --   --   --   --   --   HCT 39.1  --   --   --   --   --   PLT 307  --   --   --   --   --   NA 139  --   --   --  135  --   K 3.1*  --   --   --  3.4*  --   CL 105  --   --   --  102  --   CO2 26  --   --   --  25  --   BUN 12  --   --   --  9  --   CREATININE 0.66  --   --   --  0.67  --   GLUCOSE 96  --   --   --  229*  --   GLUCAP  --    < > 143* 177*  --  130*  INR 1.0  --   --   --   --   --   CALCIUM 8.8*  --   --   --  8.2*  --    < > = values in this interval not displayed.    Examination: Neurologically intact Neurovascular intact Sensation intact distally Intact pulses distally Dorsiflexion/Plantar flexion intact Incision: dressing C/D/I and no drainage No cellulitis present Compartment soft} XR AP&Lat of  hip shows well placed\fixed THA  Assessment:   1 Day Post-Op Procedure(s) (LRB): TOTAL HIP ARTHROPLASTY ANTERIOR APPROACH (Right) ADDITIONAL DIAGNOSIS:  Expected Acute Blood Loss Anemia, Diabetes and Hypertension  Patient's anticipated LOS is less than 2 midnights, meeting these requirements: - Younger than 94 - Lives within 1 hour of care - Has a competent adult at home to recover with post-op recover - NO history of  - Chronic pain requiring opiods  - Coronary Artery Disease  - Heart failure  - Heart attack  - DVT/VTE  - Cardiac arrhythmia  - Respiratory Failure/COPD  - Renal failure  - Anemia  - Advanced Liver disease  Plan: PT/OT WBAT, THA  DVT Prophylaxis: SCDx72 hrs, ASA 325 mg BID x 2 weeks  DISCHARGE PLAN: Home, when pt passes therapy goals  DISCHARGE NEEDS: HHPT, Walker, and 3-in-1 comode seat

## 2021-05-28 NOTE — Progress Notes (Addendum)
Physical Therapy Treatment Patient Details Name: Gina Richards MRN: [id number] DOB: 05-Feb-1961 Today's Date: 05/28/2021   History of Present Illness Pt is 60 yo female s/p R anterior THA on 05/27/21.  She has hx including anxiety, bipolar, depression, DM, HTN, CVA.    PT Comments    PT treatment session focused on bed mobility and performance of transfers and ambulation with use of rollator. PT providing education on improved stability with use of RW vs rollator, but pt reports she is more comfortable using rollator and is able to perform tasks more independently with putting things in rollator basket at home. Pt demonstrated good stability and safe pacing with use of rollator ambulating ~111ft with supervision and knowledge of locking wheels for transfers. Pt lives alone, reports that she has neighbors that may periodically check in, they know she will be returning home from the hospital following surgery but no supervision at home. Pt will benefit from continued skilled PT in order to address listed impairments and maximize safety with functional mobility to improve independence.     Recommendations for follow up therapy are one component of a multi-disciplinary discharge planning process, led by the attending physician.  Recommendations may be updated based on patient status, additional functional criteria and insurance authorization.  Follow Up Recommendations  Follow surgeon's recommendation for DC plan and follow-up therapies;Supervision for mobility/OOB (of note pt with no support at home, lives alone)     Equipment Recommendations   (recommend RW but pt preferring rollator which she has at home- wants to know cost of RW first per eval note)    Recommendations for Other Services       Precautions / Restrictions Precautions Precautions: Fall Restrictions Weight Bearing Restrictions: No RLE Weight Bearing: Weight bearing as tolerated     Mobility  Bed Mobility Overal bed  mobility: Needs Assistance Bed Mobility: Supine to Sit     Supine to sit: Supervision     General bed mobility comments: HOB ~15deg; supervision for safety    Transfers Overall transfer level: Needs assistance Equipment used: 4-wheeled walker Transfers: Sit to/from Stand Sit to Stand: Supervision         General transfer comment: Supervision provided for safety; pt demonstrated good carryover with hand placement and with good knowledge on locking rollator prior to transfers.  Ambulation/Gait Ambulation/Gait assistance: Min guard;Supervision Gait Distance (Feet): 100 Feet Assistive device: 4-wheeled walker Gait Pattern/deviations: Step-to pattern;Decreased stride length;Decreased weight shift to right Gait velocity: decreased   General Gait Details: Cues for sequencing and upright posture. MIN guard progressing to supervision for safety. Pt initially with toe pulls/small shuffle steps of Rt LE due to pain, improved advancement of Rt LE with increasing distance. Pt demonstrating good stability and safe techinque with use of rollator, pt reports she is more comfortable using rollator- has one at [employer] [facility]    Modified Rankin (Stroke Patients Only)       Balance Overall balance assessment: Needs assistance Sitting-balance support: No upper extremity supported Sitting balance-Leahy Scale: Good     Standing balance support: Bilateral upper extremity supported Standing balance-Leahy Scale: Poor Standing balance comment: use of RW                            Cognition Arousal/Alertness: Awake/alert Behavior During Therapy: WFL for tasks assessed/performed Overall Cognitive Status: Within Functional Limits for tasks  assessed                                        Exercises Total Joint Exercises Ankle Circles/Pumps: AROM;Both;20 reps;Seated Long Arc Quad: AROM;Right;5 reps;Seated    General  Comments        Pertinent Vitals/Pain Pain Assessment: 0-10 Pain Score: 6  Pain Location: R hip Pain Descriptors / Indicators: Discomfort;Sore Pain Intervention(s): Limited activity within patient's tolerance;Monitored during session;Repositioned;Ice applied    Home Living                      Prior Function            PT Goals (current goals can now be found in the care plan section) Acute Rehab PT Goals Patient Stated Goal: return home PT Goal Formulation: With patient Time For Goal Achievement: 06/10/21 Potential to Achieve Goals: Good Progress towards PT goals: Progressing toward goals    Frequency    7X/week      PT Plan Current plan remains appropriate    Co-evaluation              AM-PAC PT "6 Clicks" Mobility   Outcome Measure  Help needed turning from your back to your side while in a flat bed without using bedrails?: None Help needed moving from lying on your back to sitting on the side of a flat bed without using bedrails?: None Help needed moving to and from a bed to a chair (including a wheelchair)?: A Little Help needed standing up from a chair using your arms (e.g., wheelchair or bedside chair)?: A Little Help needed to walk in hospital room?: A Little Help needed climbing 3-5 steps with a railing? : A Little 6 Click Score: 20    End of Session Equipment Utilized During Treatment: Gait belt Activity Tolerance: Patient tolerated treatment well Patient left: in chair;with call bell/phone within reach Nurse Communication: Mobility status PT Visit Diagnosis: Unsteadiness on feet (R26.81);Muscle weakness (generalized) (M62.81);Pain Pain - Right/Left: Right Pain - part of body: Hip     Time: 1275-1700 PT Time Calculation (min) (ACUTE ONLY): 30 min  Charges:  $Therapeutic Activity: 23-37 mins                    Lyman Speller PT, DPT  Acute Rehabilitation Services  Office [phone]   05/28/2021, 11:21 AM

## 2021-05-28 NOTE — Care Management Obs Status (Signed)
MEDICARE OBSERVATION STATUS NOTIFICATION   Patient Details  Name: Gina Richards MRN: [id number] Date of Birth: [birth year]   Medicare Observation Status Notification Given:  Yes    Lorri Frederick, LCSW 05/28/2021, 11:55 AM

## 2021-05-28 NOTE — Plan of Care (Signed)
  Problem: Education: Goal: Knowledge of General Education information will improve Description: Including pain rating scale, medication(s)/side effects and non-pharmacologic comfort measures Outcome: Progressing   Problem: Pain Managment: Goal: General experience of comfort will improve Outcome: Progressing   Problem: Safety: Goal: Ability to remain free from injury will improve Outcome: Progressing   

## 2021-05-28 NOTE — Discharge Summary (Addendum)
Patient ID: Gina Richards MRN: [id number] DOB/AGE: [birth year] 62 y.o.  Admit date: 05/27/2021 Discharge date: 05/29/2021  Admission Diagnoses:  Active Problems:   S/P total right hip arthroplasty   S/P total left hip arthroplasty   Discharge Diagnoses:  Same  Past Medical History:  Diagnosis Date   Anxiety    Bipolar 1 disorder (HCC)    Depression    Diabetes mellitus without complication (HCC)    Hypertension    Pneumonia    Stroke [facility])     Surgeries: Procedure(s): TOTAL HIP ARTHROPLASTY ANTERIOR APPROACH on 05/27/2021   Consultants:   Discharged Condition: Improved  Hospital Course: Gina Richards is an 60 y.o. female who was admitted 05/27/2021 for operative treatment of<principal problem not specified>. Patient has severe unremitting pain that affects sleep, daily activities, and work/hobbies. After pre-op clearance the patient was taken to the operating room on 05/27/2021 and underwent  Procedure(s): TOTAL HIP ARTHROPLASTY ANTERIOR APPROACH.    Patient was given perioperative antibiotics:  Anti-infectives (From admission, onward)    Start     Dose/Rate Route Frequency Ordered Stop   05/27/21 1400  ceFAZolin (ANCEF) IVPB 1 g/50 mL premix        1 g 100 mL/hr over 30 Minutes Intravenous Every 6 hours 05/27/21 1342 05/27/21 2115   05/27/21 0600  ceFAZolin (ANCEF) IVPB 2g/100 mL premix        2 g 200 mL/hr over 30 Minutes Intravenous  Once 05/27/21 0551 05/27/21 0746        Patient was given sequential compression devices, early ambulation, and chemoprophylaxis to prevent DVT.  Patient benefited maximally from hospital stay and there were no complications.    Recent vital signs: Patient Vitals for the past 24 hrs:  BP Temp Temp src Pulse Resp SpO2  05/29/21 0433 115/83 98 F (36.7 C) Oral 92 16 98 %  05/28/21 2143 (!) 140/95 98.3 F (36.8 C) Oral (!) 102 18 100 %  05/28/21 1310 130/72 98.4 F (36.9 C) Oral (!) 103 18 95 %  05/28/21 0941 126/87 -- -- 99  18 96 %     Recent laboratory studies:  Recent Labs    05/27/21 0646 05/28/21 0316 05/28/21 0448 05/29/21 0315  WBC 5.1  --  11.1* 11.1*  HGB 13.2  --  9.4* 9.6*  HCT 39.1  --  28.1* 29.2*  PLT 307  --  239 232  NA 139 135  --   --   K 3.1* 3.4*  --   --   CL 105 102  --   --   CO2 26 25  --   --   BUN 12 9  --   --   CREATININE 0.66 0.67  --   --   GLUCOSE 96 229*  --   --   INR 1.0  --   --   --   CALCIUM 8.8* 8.2*  --   --      Discharge Medications:   Allergies as of 05/29/2021   No Known Allergies      Medication List     STOP taking these medications    lisinopril 20 MG tablet Commonly known as: ZESTRIL   traMADol 50 MG tablet Commonly known as: ULTRAM       TAKE these medications    aspirin EC 325 MG tablet Take 1 tablet (325 mg total) by mouth 2 (two) times daily.   cyclobenzaprine 10 MG tablet Commonly known as: FLEXERIL Take 1  tablet (10 mg total) by mouth 3 (three) times daily as needed for muscle spasms. DO NOT DRIVE WHILE TAKING THIS MEDICATION   docusate sodium 100 MG capsule Commonly known as: Colace Take 1 capsule (100 mg total) by mouth 3 (three) times daily as needed.   hydrocortisone cream 1 % Apply 1 application topically 2 (two) times daily as needed (inflammation).   losartan-hydrochlorothiazide 50-12.5 MG tablet Commonly known as: HYZAAR Take 1 tablet by mouth daily.   metFORMIN 500 MG tablet Commonly known as: GLUCOPHAGE Take 500 mg by mouth 2 (two) times daily.   oxyCODONE-acetaminophen 5-325 MG tablet Commonly known as: Percocet Take 1-2 tablets every 4 hours as needed for post operative pain. MAX 6/day   QUEtiapine Fumarate 150 MG 24 hr tablet Commonly known as: SEROQUEL XR Take 150 mg by mouth daily.   tiZANidine 4 MG tablet Commonly known as: Zanaflex Take 1 tablet (4 mg total) by mouth every 8 (eight) hours as needed for muscle spasms.               Discharge Care Instructions  (From admission,  onward)           Start     Ordered   05/28/21 0000  Weight bearing as tolerated        05/28/21 0811   05/27/21 0000  Weight bearing as tolerated        05/27/21 0945            Diagnostic Studies: DG Chest 2 View  Result Date: 05/27/2021 CLINICAL DATA:  60 year old female under preoperative evaluation prior to total hip replacement. EXAM: CHEST - 2 VIEW COMPARISON:  Chest x-ray 02/04/2020. FINDINGS: Lung volumes are normal. No consolidative airspace disease. No pleural effusions. No pneumothorax. No pulmonary nodule or mass noted. Pulmonary vasculature is normal. Heart size appears mildly enlarged. Upper mediastinal contours are within normal limits. Atherosclerosis in the thoracic aorta. IMPRESSION: 1. No radiographic evidence of acute cardiopulmonary disease. 2. Mild cardiomegaly. Electronically Signed   By: Trudie Reed M.D.   On: 05/27/2021 06:35   DG C-Arm 1-60 Min-No Report  Result Date: 05/27/2021 Fluoroscopy was utilized by the requesting physician.  No radiographic interpretation.   DG HIP OPERATIVE UNILAT W OR W/O PELVIS RIGHT  Result Date: 05/27/2021 CLINICAL DATA:  Right hip replacement. EXAM: OPERATIVE RIGHT HIP (WITH PELVIS IF PERFORMED) 1 VIEWS TECHNIQUE: Fluoroscopic spot image(s) were submitted for interpretation post-operatively. COMPARISON:  None. FINDINGS: Fluoroscopic images were obtained intraoperatively and submitted for post operative interpretation. Right total hip arthroplasty with hardware in expected position, 1 image was obtained with 11 seconds of fluoroscopy time. Please see the performing provider's procedural report for further detail. IMPRESSION: Right total hip arthroplasty in expected position. Electronically Signed   By: Sherron Ales M.D.   On: 05/27/2021 09:55    Disposition: Discharge disposition: 01-Home or Self Care       Discharge Instructions     Call MD / Call 911   Complete by: As directed    If you experience chest pain or  shortness of breath, CALL 911 and be transported to the hospital emergency room.  If you develope a fever above 101 F, pus (white drainage) or increased drainage or redness at the wound, or calf pain, call your surgeon's office.   Constipation Prevention   Complete by: As directed    Drink plenty of fluids.  Prune juice may be helpful.  You may use a stool softener, such as Colace (over  the counter) 100 mg twice a day.  Use MiraLax (over the counter) for constipation as needed.   Diet - low sodium heart healthy   Complete by: As directed    Driving restrictions   Complete by: As directed    No driving for 2 weeks   Increase activity slowly as tolerated   Complete by: As directed    Patient may shower   Complete by: As directed    You may shower without a dressing once there is no drainage.  Do not wash over the wound.  If drainage remains, cover wound with plastic wrap and then shower.   Post-operative opioid taper instructions:   Complete by: As directed    POST-OPERATIVE OPIOID TAPER INSTRUCTIONS: It is important to wean off of your opioid medication as soon as possible. If you do not need pain medication after your surgery it is ok to stop day one. Opioids include: Codeine, Hydrocodone(Norco, Vicodin), Oxycodone(Percocet, oxycontin) and hydromorphone amongst others.  Long term and even short term use of opiods can cause: Increased pain response Dependence Constipation Depression Respiratory depression And more.  Withdrawal symptoms can include Flu like symptoms Nausea, vomiting And more Techniques to manage these symptoms Hydrate well Eat regular healthy meals Stay active Use relaxation techniques(deep breathing, meditating, yoga) Do Not substitute Alcohol to help with tapering If you have been on opioids for less than two weeks and do not have pain than it is ok to stop all together.  Plan to wean off of opioids This plan should start within one week post op of your joint  replacement. Maintain the same interval or time between taking each dose and first decrease the dose.  Cut the total daily intake of opioids by one tablet each day Next start to increase the time between doses. The last dose that should be eliminated is the evening dose.      Weight bearing as tolerated   Complete by: As directed    Weight bearing as tolerated   Complete by: As directed         Follow-up Information     Jodi Geralds, MD Follow up in 2 week(s).   Specialty: Orthopedic Surgery Contact information: [address] [place] [address] [phone]                  Signed: Dannielle Burn 05/29/2021, 7:17 AM

## 2021-05-28 NOTE — Progress Notes (Signed)
PT Cancellation Note  Patient Details Name: Gina Richards MRN: [id number] DOB: [birth year]   Cancelled Treatment:    Reason Eval/Treat Not Completed: Pain limiting ability to participate (pt tearful upon entry and reporting 10/10 pain. Pt requesting therapist to come back tomorrow, RN notified. PT will follow up as schedule allows.)   Lyman Speller., PT, DPT  Acute Rehabilitation Services  Office [phone]  05/28/2021, 2:33 PM

## 2021-05-29 DIAGNOSIS — M1611 Unilateral primary osteoarthritis, right hip: Secondary | ICD-10-CM | POA: Diagnosis not present

## 2021-05-29 LAB — CBC
HCT: 29.2 % — ABNORMAL LOW (ref 36.0–46.0)
Hemoglobin: 9.6 g/dL — ABNORMAL LOW (ref 12.0–15.0)
MCH: 31.8 pg (ref 26.0–34.0)
MCHC: 32.9 g/dL (ref 30.0–36.0)
MCV: 96.7 fL (ref 80.0–100.0)
Platelets: 232 10*3/uL (ref 150–400)
RBC: 3.02 MIL/uL — ABNORMAL LOW (ref 3.87–5.11)
RDW: 14.6 % (ref 11.5–15.5)
WBC: 11.1 10*3/uL — ABNORMAL HIGH (ref 4.0–10.5)
nRBC: 0 % (ref 0.0–0.2)

## 2021-05-29 LAB — GLUCOSE, CAPILLARY
Glucose-Capillary: 87 mg/dL (ref 70–99)
Glucose-Capillary: 99 mg/dL (ref 70–99)
Glucose-Capillary: 147 mg/dL — ABNORMAL HIGH (ref 70–99)
Glucose-Capillary: 109 mg/dL — ABNORMAL HIGH (ref 70–99)

## 2021-05-29 NOTE — Progress Notes (Signed)
Physical Therapy Treatment Patient Details Name: Gina Richards MRN: [id number] DOB: 15-Nov-1960 Today's Date: 05/29/2021   History of Present Illness Pt is 60 yo female s/p R anterior THA on 05/27/21.  She has hx including anxiety, bipolar, depression, DM, HTN, CVA.    PT Comments    Pt required Min-Mod A for all mobility tasks this session. Significant pain reported. She only tolerated walking ~15 feet x 2 (to/from bathroom). Will continue to follow and progress activity as tolerated.     Recommendations for follow up therapy are one component of a multi-disciplinary discharge planning process, led by the attending physician.  Recommendations may be updated based on patient status, additional functional criteria and insurance authorization.  Follow Up Recommendations  Follow surgeon's recommendation for DC plan and follow-up therapies;Supervision for mobility/OOB     Equipment Recommendations  Rolling walker with 5" wheels;3in1 (PT)    Recommendations for Other Services       Precautions / Restrictions Precautions Precautions: Fall Restrictions Weight Bearing Restrictions: No RLE Weight Bearing: Weight bearing as tolerated     Mobility  Bed Mobility Overal bed mobility: Needs Assistance Bed Mobility: Supine to Sit;Sit to Supine     Supine to sit: Min assist;HOB elevated Sit to supine: Min assist;HOB elevated   General bed mobility comments: Assist for trunk and R LE. Increased time. Cues provided.    Transfers Overall transfer level: Needs assistance Equipment used: Rolling walker (2 wheeled) Transfers: Sit to/from Stand Sit to Stand: Min assist;Mod assist         General transfer comment: Min A from elevated bed. Mod A from low toilet. Assist to power up, steady, control descent. Cues for safety, technique, hand/LE placement. Increased time.  Ambulation/Gait Ambulation/Gait assistance: Min assist Gait Distance (Feet): 15 Feet (2) Assistive device: Rolling  walker (2 wheeled) Gait Pattern/deviations: Step-to pattern;Antalgic     General Gait Details: Cues for safety, technique, sequence, posture, RW proximity. Pt had difficlty advancing R LE throughout distance with tendancy to allow R LE to lag behind. Pt requested to use standard RW this session   Stairs             Wheelchair Mobility    Modified Rankin (Stroke Patients Only)       Balance Overall balance assessment: Needs assistance         Standing balance support: Bilateral upper extremity supported Standing balance-Leahy Scale: Poor                              Cognition Arousal/Alertness: Awake/alert Behavior During Therapy: WFL for tasks assessed/performed Overall Cognitive Status: Within Functional Limits for tasks assessed                                        Exercises      General Comments        Pertinent Vitals/Pain Pain Assessment: 0-10 Pain Score: 8  Pain Location: R hip/thigh Pain Descriptors / Indicators: Discomfort;Sore;Aching;Tightness Pain Intervention(s): Limited activity within patient's tolerance;Monitored during session;Ice applied;Repositioned    Home Living                      Prior Function            PT Goals (current goals can now be found in the care plan section) Progress towards PT goals:  Progressing toward goals    Frequency    7X/week      PT Plan Current plan remains appropriate    Co-evaluation              AM-PAC PT "6 Clicks" Mobility   Outcome Measure  Help needed turning from your back to your side while in a flat bed without using bedrails?: A Little Help needed moving from lying on your back to sitting on the side of a flat bed without using bedrails?: A Little Help needed moving to and from a bed to a chair (including a wheelchair)?: A Little Help needed standing up from a chair using your arms (e.g., wheelchair or bedside chair)?: A Little Help  needed to walk in hospital room?: A Little Help needed climbing 3-5 steps with a railing? : A Lot 6 Click Score: 17    End of Session Equipment Utilized During Treatment: Gait belt Activity Tolerance: Patient limited by fatigue;Patient limited by pain Patient left: in bed;with call bell/phone within reach;with bed alarm set   PT Visit Diagnosis: Unsteadiness on feet (R26.81);Muscle weakness (generalized) (M62.81);Pain Pain - Right/Left: Right Pain - part of body: Hip     Time: 2423-5361 PT Time Calculation (min) (ACUTE ONLY): 23 min  Charges:  $Gait Training: 23-37 mins                         Faye Ramsay, PT Acute Rehabilitation  Office: [phone] Pager: [phone]

## 2021-05-29 NOTE — Plan of Care (Signed)

## 2021-05-29 NOTE — Progress Notes (Signed)
Physical Therapy Treatment Patient Details Name: Gina Richards MRN: [id number] DOB: [birth year] Today's Date: 05/29/2021   History of Present Illness Pt is 60 yo female s/p R anterior THA on 05/27/21.  She has hx including anxiety, bipolar, depression, DM, HTN, CVA.    PT Comments    Improved performance this session compared to this morning. Moderate pain with activity. Pt walked ~75 feet. Will plan for d/c home on tomorrow after PT session. Pt does not have any in home help arranged so she will be home alone.      Recommendations for follow up therapy are one component of a multi-disciplinary discharge planning process, led by the attending physician.  Recommendations may be updated based on patient status, additional functional criteria and insurance authorization.  Follow Up Recommendations  Follow surgeon's recommendation for DC plan and follow-up therapies;Supervision for mobility/OOB     Equipment Recommendations  Rolling walker with 5" wheels;3in1 (PT)    Recommendations for Other Services       Precautions / Restrictions Precautions Precautions: Fall Restrictions Weight Bearing Restrictions: No RLE Weight Bearing: Weight bearing as tolerated     Mobility  Bed Mobility Overal bed mobility: Needs Assistance Bed Mobility: Supine to Sit;Sit to Supine     Supine to sit: Min guard;HOB elevated Sit to supine: Min assist   General bed mobility comments: Assist for R LE onto bed only. Increased time. Cues provided.    Transfers Overall transfer level: Needs assistance Equipment used: Rolling walker (2 wheeled) Transfers: Sit to/from Stand Sit to Stand: Min guard         General transfer comment: Min guard for safety. Increased time. Cues for safety, technique, hand placement.  Ambulation/Gait Ambulation/Gait assistance: Min guard Gait Distance (Feet): 75 Feet Assistive device: Rolling walker (2 wheeled) Gait Pattern/deviations: Step-to pattern;Step-through  pattern;Decreased stride length     General Gait Details: Cues for safety, technique, sequence, posture, RW proximity, and for pt to take steps instead of scooting foot. Tolerated distance well.   Stairs             Wheelchair Mobility    Modified Rankin (Stroke Patients Only)       Balance Overall balance assessment: Needs assistance         Standing balance support: Bilateral upper extremity supported Standing balance-Leahy Scale: Poor                              Cognition Arousal/Alertness: Awake/alert Behavior During Therapy: WFL for tasks assessed/performed Overall Cognitive Status: Within Functional Limits for tasks assessed                                        Exercises      General Comments        Pertinent Vitals/Pain Pain Assessment: 0-10 Pain Score: 7  Pain Location: R hip/thigh Pain Descriptors / Indicators: Discomfort;Sore;Tightness;Burning Pain Intervention(s): Limited activity within patient's tolerance;Monitored during session;Ice applied;Repositioned    Home Living                      Prior Function            PT Goals (current goals can now be found in the care plan section) Progress towards PT goals: Progressing toward goals    Frequency    7X/week  PT Plan Current plan remains appropriate    Co-evaluation              AM-PAC PT "6 Clicks" Mobility   Outcome Measure  Help needed turning from your back to your side while in a flat bed without using bedrails?: A Little Help needed moving from lying on your back to sitting on the side of a flat bed without using bedrails?: A Little Help needed moving to and from a bed to a chair (including a wheelchair)?: A Little Help needed standing up from a chair using your arms (e.g., wheelchair or bedside chair)?: A Little Help needed to walk in hospital room?: A Little Help needed climbing 3-5 steps with a railing? : A Lot 6  Click Score: 17    End of Session Equipment Utilized During Treatment: Gait belt Activity Tolerance: Patient tolerated treatment well Patient left: in bed;with call bell/phone within reach;with bed alarm set;with family/visitor present   PT Visit Diagnosis: Unsteadiness on feet (R26.81);Muscle weakness (generalized) (M62.81);Pain Pain - Right/Left: Right Pain - part of body: Hip     Time: 1610-1640 PT Time Calculation (min) (ACUTE ONLY): 30 min  Charges:  $Gait Training: 23-37 mins                        Faye Ramsay, PT Acute Rehabilitation  Office: [phone] Pager: [phone]

## 2021-05-29 NOTE — Progress Notes (Signed)
PATIENT ID: Gina Richards  MRN: [id number]  DOB/AGE:  [birth year] / 60 y.o.  2 Days Post-Op Procedure(s) (LRB): TOTAL HIP ARTHROPLASTY ANTERIOR APPROACH (Right)    PROGRESS NOTE Subjective: Patient is alert, oriented, no Nausea, no Vomiting, yes passing gas, . Taking PO well. Denies SOB, Chest or Calf Pain. Using Incentive Spirometer, PAS in place. Ambulate WBAT with therapy limited yesterday due to pain Patient reports pain as  moderate .    Objective: Vital signs in last 24 hours: Vitals:   05/28/21 0941 05/28/21 1310 05/28/21 2143 05/29/21 0433  BP: 126/87 130/72 (!) 140/95 115/83  Pulse: 99 (!) 103 (!) 102 92  Resp: 18 18 18 16   Temp:  98.4 F (36.9 C) 98.3 F (36.8 C) 98 F (36.7 C)  TempSrc:  Oral Oral Oral  SpO2: 96% 95% 100% 98%  Weight:      Height:          Intake/Output from previous day: I/O last 3 completed shifts: In: 3246.9 [P.O.:1920; I.V.:1326.9] Out: 1300 [Urine:1300]   Intake/Output this shift: No intake/output data recorded.   LABORATORY DATA: Recent Labs    05/27/21 0646 05/27/21 1007 05/28/21 0316 05/28/21 0448 05/28/21 0731 05/28/21 1127 05/28/21 1611 05/28/21 2141 05/29/21 0315  WBC 5.1  --   --  11.1*  --   --   --   --  11.1*  HGB 13.2  --   --  9.4*  --   --   --   --  9.6*  HCT 39.1  --   --  28.1*  --   --   --   --  29.2*  PLT 307  --   --  239  --   --   --   --  232  NA 139  --  135  --   --   --   --   --   --   K 3.1*  --  3.4*  --   --   --   --   --   --   CL 105  --  102  --   --   --   --   --   --   CO2 26  --  25  --   --   --   --   --   --   BUN 12  --  9  --   --   --   --   --   --   CREATININE 0.66  --  0.67  --   --   --   --   --   --   GLUCOSE 96  --  229*  --   --   --   --   --   --   GLUCAP  --    < >  --   --    < > 123* 178* 131*  --   INR 1.0  --   --   --   --   --   --   --   --   CALCIUM 8.8*  --  8.2*  --   --   --   --   --   --    < > = values in this interval not displayed.     Examination: Neurologically intact Neurovascular intact Sensation intact distally Intact pulses distally Dorsiflexion/Plantar flexion intact Incision: dressing C/D/I and no drainage No cellulitis present Compartment soft} XR AP&Lat  of hip shows well placed\fixed THA  Assessment:   2 Days Post-Op Procedure(s) (LRB): TOTAL HIP ARTHROPLASTY ANTERIOR APPROACH (Right) ADDITIONAL DIAGNOSIS:  Expected Acute Blood Loss Anemia, Diabetes and Hypertension Anticipated LOS equal to or greater than 2 midnights due to - Age 26 and older with one or more of the following:  - Obesity  - Expected need for hospital services (PT, OT, Nursing) required for safe  discharge  - Anticipated need for postoperative skilled nursing care or inpatient rehab  - Active co-morbidities: Diabetes OR   - Unanticipated findings during/Post Surgery: Slow post-op progression: GI, pain control, mobility  - Patient is a high risk of re-admission due to: Barriers to post-acute care (logistical, no family support in home)   Plan: PT/OT WBAT, THA  DVT Prophylaxis: SCDx72 hrs, ASA 325 mg BID x 2 weeks  DISCHARGE PLAN: Home  DISCHARGE NEEDS: HHPT, Walker, and 3-in-1 comode seat

## 2021-05-29 NOTE — Progress Notes (Signed)
Dannielle Burn called and notified that pt is not ready for d/c. No needs at this time. Rn will continue to monitor.

## 2021-05-29 NOTE — Plan of Care (Signed)
  Problem: Health Behavior/Discharge Planning: Goal: Ability to manage health-related needs will improve Outcome: Progressing   Problem: Clinical Measurements: Goal: Ability to maintain clinical measurements within normal limits will improve Outcome: Progressing   Problem: Activity: Goal: Risk for activity intolerance will decrease Outcome: Progressing   Problem: Pain Managment: Goal: General experience of comfort will improve Outcome: Progressing   

## 2021-05-30 DIAGNOSIS — M1611 Unilateral primary osteoarthritis, right hip: Secondary | ICD-10-CM | POA: Diagnosis not present

## 2021-05-30 LAB — GLUCOSE, CAPILLARY
Glucose-Capillary: 138 mg/dL — ABNORMAL HIGH (ref 70–99)
Glucose-Capillary: 119 mg/dL — ABNORMAL HIGH (ref 70–99)

## 2021-05-30 NOTE — Progress Notes (Addendum)
Physical Therapy Treatment Patient Details Name: Gina Richards MRN: [id number] DOB: [birth year] Today's Date: 05/30/2021   History of Present Illness Pt is 60 yo female s/p R anterior THA on 05/27/21.  She has hx including anxiety, bipolar, depression, DM, HTN, CVA.    PT Comments    Progressing well. Okay to d/c from PT standpoint.    Recommendations for follow up therapy are one component of a multi-disciplinary discharge planning process, led by the attending physician.  Recommendations may be updated based on patient status, additional functional criteria and insurance authorization.  Follow Up Recommendations  Follow surgeon's recommendation for DC plan and follow-up therapies; Supervision OOB/mobility     Equipment Recommendations  Rolling walker with 5" wheels;3in1 (PT)    Recommendations for Other Services       Precautions / Restrictions Precautions Precautions: Fall Restrictions Weight Bearing Restrictions: No RLE Weight Bearing: Weight bearing as tolerated     Mobility  Bed Mobility Overal bed mobility: Needs Assistance Bed Mobility: Supine to Sit;Sit to Supine     Supine to sit: Min guard;HOB elevated Sit to supine: Min guard;HOB elevated   General bed mobility comments: patient was able to use gait belt as leg lifter to get RLE in and out of bed on this date.    Transfers Overall transfer level: Needs assistance Equipment used: Rolling walker (2 wheeled) Transfers: Sit to/from Stand Sit to Stand: Min guard         General transfer comment: Min guard for safety. Increased time. Cues for safety, technique, hand placement.  Ambulation/Gait Ambulation/Gait assistance: Min guard Gait Distance (Feet): 100 Feet Assistive device: Rolling walker (2 wheeled) Gait Pattern/deviations: Step-to pattern;Step-through pattern;Decreased stride length     General Gait Details: Cues for safety, technique, sequence, posture, RW proximity, and for pt to take  steps instead of scooting foot. Tolerated distance well.   Stairs             Wheelchair Mobility    Modified Rankin (Stroke Patients Only)       Balance Overall balance assessment: Needs assistance Sitting-balance support: No upper extremity supported Sitting balance-Leahy Scale: Good     Standing balance support: Bilateral upper extremity supported Standing balance-Leahy Scale: Fair                              Cognition Arousal/Alertness: Awake/alert Behavior During Therapy: WFL for tasks assessed/performed Overall Cognitive Status: Within Functional Limits for tasks assessed                                        Exercises      General Comments        Pertinent Vitals/Pain Pain Assessment: 0-10 Pain Score: 7  Pain Location: R hip/thigh Pain Descriptors / Indicators: Discomfort;Sore;Tightness;Burning Pain Intervention(s): Monitored during session;Ice applied;Repositioned    Home Living Family/patient expects to be discharged to:: Private residence Living Arrangements: Alone Available Help at Discharge: Other (Comment) (reported sister might be able to come help a little) Type of Home: Apartment Home Access: Level entry   Home Layout: One level Home Equipment: Cane - single point;Walker - 4 wheels Additional Comments: life alert    Prior Function Level of Independence: Independent with assistive device(s)  Gait / Transfers Assistance Needed: Use RW or cane depending on the day and how her hip felt ADL's /  Homemaking Assistance Needed: Uses rollator in home, when cooking or needs to carry something uses seat to carry items Comments: Independent with ADLs, IADLs, community ambulation with AD, but does not drive.   PT Goals (current goals can now be found in the care plan section) Acute Rehab PT Goals Patient Stated Goal: to go home today Progress towards PT goals: Progressing toward goals    Frequency     7X/week      PT Plan Current plan remains appropriate    Co-evaluation              AM-PAC PT "6 Clicks" Mobility   Outcome Measure  Help needed turning from your back to your side while in a flat bed without using bedrails?: A Little Help needed moving from lying on your back to sitting on the side of a flat bed without using bedrails?: A Little Help needed moving to and from a bed to a chair (including a wheelchair)?: A Little Help needed standing up from a chair using your arms (e.g., wheelchair or bedside chair)?: A Little Help needed to walk in hospital room?: A Little Help needed climbing 3-5 steps with a railing? : A Little 6 Click Score: 18    End of Session   Activity Tolerance: Patient tolerated treatment well Patient left: in bed;with call bell/phone within reach;with bed alarm set   PT Visit Diagnosis: Unsteadiness on feet (R26.81);Pain Pain - Right/Left: Right Pain - part of body: Hip     Time: 1002-1020 PT Time Calculation (min) (ACUTE ONLY): 18 min  Charges:  $Gait Training: 8-22 mins                         Faye Ramsay, PT Acute Rehabilitation  Office: [phone] Pager: [phone]

## 2021-05-30 NOTE — Progress Notes (Signed)
   PATIENT ID: Gina Richards   3 Days Post-Op Procedure(s) (LRB): TOTAL HIP ARTHROPLASTY ANTERIOR APPROACH (Right)  Subjective: Doing well, hopes/plans to go home today. Pain improving. Up with PT and walking with walker.   Objective:  Vitals:   05/29/21 2131 05/30/21 0437  BP: 129/89 118/82  Pulse: (!) 108 96  Resp:    Temp: 99.8 F (37.7 C) 99.3 F (37.4 C)  SpO2: 100% 100%     R hip dressing c/d/I Wiggles toes, distally nvi Calves soft nontender  Labs:  Recent Labs    05/28/21 0448 05/29/21 0315  HGB 9.4* 9.6*   Recent Labs    05/28/21 0448 05/29/21 0315  WBC 11.1* 11.1*  RBC 2.97* 3.02*  HCT 28.1* 29.2*  PLT 239 232   Recent Labs    05/28/21 0316  NA 135  K 3.4*  CL 102  CO2 25  BUN 9  CREATININE 0.67  GLUCOSE 229*  CALCIUM 8.2*    Assessment and Plan: 3 Days Post-Op Procedure(s) (LRB): TOTAL HIP ARTHROPLASTY ANTERIOR APPROACH (Right) ADDITIONAL DIAGNOSIS:  Expected Acute Blood Loss Anemia, Diabetes and Hypertension Anticipated LOS equal to or greater than 2 midnights due to - Age 62 and older with one or more of the following:             - Obesity             - Expected need for hospital services (PT, OT, Nursing) required for safe   discharge             - Anticipated need for postoperative skilled nursing care or inpatient rehab             - Active co-morbidities: Diabetes OR    - Unanticipated findings during/Post Surgery: Slow post-op progression: GI, pain control, mobility  - Patient is a high risk of re-admission due to: Barriers to post-acute care (logistical, no family support in home)    Plan: PT/OT WBAT, THA   DVT Prophylaxis: SCDx72 hrs, ASA 325 mg BID x 2 weeks   DISCHARGE PLAN: Home   DISCHARGE NEEDS: HHPT, Walker, and 3-in-1 comode seat

## 2021-05-30 NOTE — Evaluation (Signed)
Occupational Therapy Evaluation Patient Details Name: Gina Richards MRN: [id number] DOB: 22-Jun-1961 Today's Date: 05/30/2021   History of Present Illness Pt is 60 yo female s/p R anterior THA on 05/27/21.  She has hx including anxiety, bipolar, depression, DM, HTN, CVA.   Clinical Impression    Patient is a 60 year old female who was living at home alone prior to hospitalization. Currently, patient needs min guard for transfers and functional mobility. Patient needs mod A for LB dressing tasks when not using AE. Patient was educated on AE and provided with reacher and sock aid with patient able to complete LB dressing tasks with min guard/min A.patient plans to transition home alone today with extensive education on reaching out to family to come assist her or going to stay with daughters who live locally. Patient reported she would have "some" help from sister at time of d/c. Patient would continue to benefit from skilled OT services at this time while admitted to address noted deficits in order to improve overall safety and independence in ADLs.       Recommendations for follow up therapy are one component of a multi-disciplinary discharge planning process, led by the attending physician.  Recommendations may be updated based on patient status, additional functional criteria and insurance authorization.   Follow Up Recommendations  Follow surgeon's recommendation for DC plan and follow-up therapies;Supervision/Assistance - 24 hour    Equipment Recommendations  Other (comment) (RW)    Recommendations for Other Services       Precautions / Restrictions Precautions Precautions: Fall Restrictions Weight Bearing Restrictions: Yes RLE Weight Bearing: Weight bearing as tolerated      Mobility Bed Mobility Overal bed mobility: Needs Assistance Bed Mobility: Supine to Sit;Sit to Supine     Supine to sit: Min guard;HOB elevated Sit to supine: Min guard   General bed mobility  comments: patient was able to use gait belt as leg lifter to get RLE in and out of bed on this date.    Transfers Overall transfer level: Needs assistance Equipment used: Rolling walker (2 wheeled) Transfers: Sit to/from Stand Sit to Stand: Min guard         General transfer comment: Min guard for safety. Increased time. Cues for safety, technique, hand placement.    Balance Overall balance assessment: Needs assistance Sitting-balance support: No upper extremity supported Sitting balance-Leahy Scale: Good     Standing balance support: During functional activity;Single extremity supported Standing balance-Leahy Scale: Fair                             ADL either performed or assessed with clinical judgement   ADL Overall ADL's : Needs assistance/impaired Eating/Feeding: Modified independent;Sitting   Grooming: Wash/dry face;Sitting   Upper Body Bathing: Sitting;Set up   Lower Body Bathing: Sitting/lateral leans;Minimal assistance Lower Body Bathing Details (indicate cue type and reason): patient was educated on using AE for washing LB. Upper Body Dressing : Sitting;Set up   Lower Body Dressing: Sit to/from stand;Moderate assistance;With adaptive equipment Lower Body Dressing Details (indicate cue type and reason): without AE. with AE was min guard with education to use AE appropirately. patient was provided with reacher and sock aid at this time. Toilet Transfer: Medical illustrator and Hygiene: Min guard;Sit to/from stand       Functional mobility during ADLs: Min guard;Rolling walker General ADL Comments: patient was able to complete functional mobility in hallway  over 100 feet with drifting to R side noted with attempts to walk in straight line. patient was educated extensively on differences between RW and rollator with patient having notable self limiting WB on RLE with functional mobility and  offloading to BUE.     Vision Baseline Vision/History: 1 Wears glasses Ability to See in Adequate Light: 1 Impaired Patient Visual Report: No change from baseline       Perception     Praxis      Pertinent Vitals/Pain Pain Assessment: 0-10 Pain Score: 7  Pain Location: R hip/thigh Pain Descriptors / Indicators: Discomfort;Sore;Tightness;Burning Pain Intervention(s): Limited activity within patient's tolerance;Monitored during session;Premedicated before session;Repositioned     Hand Dominance Right   Extremity/Trunk Assessment Upper Extremity Assessment Upper Extremity Assessment: Overall WFL for tasks assessed   Lower Extremity Assessment Lower Extremity Assessment: Defer to PT evaluation   Cervical / Trunk Assessment Cervical / Trunk Assessment: Normal   Communication Communication Communication: No difficulties   Cognition Arousal/Alertness: Awake/alert Behavior During Therapy: WFL for tasks assessed/performed Overall Cognitive Status: Within Functional Limits for tasks assessed                                     General Comments       Exercises     Shoulder Instructions      Home Living Family/patient expects to be discharged to:: Private residence Living Arrangements: Alone Available Help at Discharge: Other (Comment) (reported sister might be able to come help a little) Type of Home: Apartment Home Access: Level entry     Home Layout: One level     Bathroom Shower/Tub: Chief Strategy Officer: Standard     Home Equipment: Cane - single point;Walker - 4 wheels   Additional Comments: life alert      Prior Functioning/Environment Level of Independence: Independent with assistive device(s)  Gait / Transfers Assistance Needed: Use RW or cane depending on the day and how her hip felt ADL's / Homemaking Assistance Needed: Uses rollator in home, when cooking or needs to carry something uses seat to carry items    Comments: Independent with ADLs, IADLs, community ambulation with AD, but does not drive.        OT Problem List: Decreased strength;Decreased activity tolerance;Impaired balance (sitting and/or standing);Decreased safety awareness;Decreased knowledge of use of DME or AE;Decreased knowledge of precautions      OT Treatment/Interventions: Self-care/ADL training;Therapeutic exercise;DME and/or AE instruction;Therapeutic activities;Balance training;Patient/family education    OT Goals(Current goals can be found in the care plan section) Acute Rehab OT Goals Patient Stated Goal: to go home today OT Goal Formulation: With patient Time For Goal Achievement: 06/13/21 Potential to Achieve Goals: Good  OT Frequency: Min 2X/week   Barriers to D/C:    patient is home alone       Co-evaluation              AM-PAC OT "6 Clicks" Daily Activity     Outcome Measure Help from another person eating meals?: None Help from another person taking care of personal grooming?: None Help from another person toileting, which includes using toliet, bedpan, or urinal?: A Little Help from another person bathing (including washing, rinsing, drying)?: A Little Help from another person to put on and taking off regular upper body clothing?: None Help from another person to put on and taking off regular lower body clothing?: A Little 6 Click  Score: 21   End of Session Equipment Utilized During Treatment: Gait belt;Rolling walker Nurse Communication: Other (comment) (need for RW)  Activity Tolerance: Patient tolerated treatment well Patient left: in bed;with call bell/phone within reach  OT Visit Diagnosis: Unsteadiness on feet (R26.81);Pain Pain - Right/Left: Right Pain - part of body: Leg                Time: 0812-0842 OT Time Calculation (min): 30 min Charges:  OT General Charges $OT Visit: 1 Visit OT Evaluation $OT Eval Low Complexity: 1 Low OT Treatments $Self Care/Home Management : 8-22  mins  Sharyn Blitz OTR/L, MS Acute Rehabilitation Department Office# [phone] Pager# [phone]   [facility]almers Guest Michelyn Scullin 05/30/2021, 9:42 AM

## 2021-05-30 NOTE — Discharge Summary (Signed)
Patient ID: Gina Richards MRN: [id number] DOB/AGE: October 06, 1960 60 y.o.  Admit date: 05/27/2021 Discharge date: 05/30/2021  Admission Diagnoses:  Active Problems:   S/P total right hip arthroplasty   S/P total left hip arthroplasty   Discharge Diagnoses:  Same  Past Medical History:  Diagnosis Date   Anxiety    Bipolar 1 disorder (HCC)    Depression    Diabetes mellitus without complication (HCC)    Hypertension    Pneumonia    Stroke [facility])     Surgeries: Procedure(s): TOTAL HIP ARTHROPLASTY ANTERIOR APPROACH on 05/27/2021   Consultants:   Discharged Condition: Improved  Hospital Course: Gina Richards is an 60 y.o. female who was admitted 05/27/2021 for operative treatment of right hip OA. Patient has severe unremitting pain that affects sleep, daily activities, and work/hobbies. After pre-op clearance the patient was taken to the operating room on 05/27/2021 and underwent  Procedure(s): TOTAL HIP ARTHROPLASTY ANTERIOR APPROACH.    Patient was given perioperative antibiotics:  Anti-infectives (From admission, onward)    Start     Dose/Rate Route Frequency Ordered Stop   05/27/21 1400  ceFAZolin (ANCEF) IVPB 1 g/50 mL premix        1 g 100 mL/hr over 30 Minutes Intravenous Every 6 hours 05/27/21 1342 05/27/21 2115   05/27/21 0600  ceFAZolin (ANCEF) IVPB 2g/100 mL premix        2 g 200 mL/hr over 30 Minutes Intravenous  Once 05/27/21 0551 05/27/21 0746        Patient was given sequential compression devices, early ambulation, and chemoprophylaxis to prevent DVT.  Patient benefited maximally from hospital stay and there were no complications.    Recent vital signs: Patient Vitals for the past 24 hrs:  BP Temp Temp src Pulse Resp SpO2  05/30/21 0437 118/82 99.3 F (37.4 C) Oral 96 -- 100 %  05/29/21 2131 129/89 99.8 F (37.7 C) Oral (!) 108 -- 100 %  05/29/21 1310 -- -- -- 100 -- --  05/29/21 1309 111/80 98.6 F (37 C) Oral (!) 113 18 94 %     Recent  laboratory studies:  Recent Labs    05/28/21 0316 05/28/21 0448 05/29/21 0315  WBC  --  11.1* 11.1*  HGB  --  9.4* 9.6*  HCT  --  28.1* 29.2*  PLT  --  239 232  NA 135  --   --   K 3.4*  --   --   CL 102  --   --   CO2 25  --   --   BUN 9  --   --   CREATININE 0.67  --   --   GLUCOSE 229*  --   --   CALCIUM 8.2*  --   --      Discharge Medications:   Allergies as of 05/30/2021   No Known Allergies      Medication List     STOP taking these medications    lisinopril 20 MG tablet Commonly known as: ZESTRIL   traMADol 50 MG tablet Commonly known as: ULTRAM       TAKE these medications    aspirin EC 325 MG tablet Take 1 tablet (325 mg total) by mouth 2 (two) times daily.   cyclobenzaprine 10 MG tablet Commonly known as: FLEXERIL Take 1 tablet (10 mg total) by mouth 3 (three) times daily as needed for muscle spasms. DO NOT DRIVE WHILE TAKING THIS MEDICATION   docusate sodium 100 MG  capsule Commonly known as: Colace Take 1 capsule (100 mg total) by mouth 3 (three) times daily as needed.   hydrocortisone cream 1 % Apply 1 application topically 2 (two) times daily as needed (inflammation).   losartan-hydrochlorothiazide 50-12.5 MG tablet Commonly known as: HYZAAR Take 1 tablet by mouth daily.   metFORMIN 500 MG tablet Commonly known as: GLUCOPHAGE Take 500 mg by mouth 2 (two) times daily.   oxyCODONE-acetaminophen 5-325 MG tablet Commonly known as: Percocet Take 1-2 tablets every 4 hours as needed for post operative pain. MAX 6/day   QUEtiapine Fumarate 150 MG 24 hr tablet Commonly known as: SEROQUEL XR Take 150 mg by mouth daily.   tiZANidine 4 MG tablet Commonly known as: Zanaflex Take 1 tablet (4 mg total) by mouth every 8 (eight) hours as needed for muscle spasms.               Discharge Care Instructions  (From admission, onward)           Start     Ordered   05/28/21 0000  Weight bearing as tolerated        05/28/21 0811    05/27/21 0000  Weight bearing as tolerated        05/27/21 0945            Diagnostic Studies: DG Chest 2 View  Result Date: 05/27/2021 CLINICAL DATA:  60 year old female under preoperative evaluation prior to total hip replacement. EXAM: CHEST - 2 VIEW COMPARISON:  Chest x-ray 02/04/2020. FINDINGS: Lung volumes are normal. No consolidative airspace disease. No pleural effusions. No pneumothorax. No pulmonary nodule or mass noted. Pulmonary vasculature is normal. Heart size appears mildly enlarged. Upper mediastinal contours are within normal limits. Atherosclerosis in the thoracic aorta. IMPRESSION: 1. No radiographic evidence of acute cardiopulmonary disease. 2. Mild cardiomegaly. Electronically Signed   By: Trudie Reed M.D.   On: 05/27/2021 06:35   DG C-Arm 1-60 Min-No Report  Result Date: 05/27/2021 Fluoroscopy was utilized by the requesting physician.  No radiographic interpretation.   DG HIP OPERATIVE UNILAT W OR W/O PELVIS RIGHT  Result Date: 05/27/2021 CLINICAL DATA:  Right hip replacement. EXAM: OPERATIVE RIGHT HIP (WITH PELVIS IF PERFORMED) 1 VIEWS TECHNIQUE: Fluoroscopic spot image(s) were submitted for interpretation post-operatively. COMPARISON:  None. FINDINGS: Fluoroscopic images were obtained intraoperatively and submitted for post operative interpretation. Right total hip arthroplasty with hardware in expected position, 1 image was obtained with 11 seconds of fluoroscopy time. Please see the performing provider's procedural report for further detail. IMPRESSION: Right total hip arthroplasty in expected position. Electronically Signed   By: Sherron Ales M.D.   On: 05/27/2021 09:55    Disposition: Discharge disposition: 01-Home or Self Care       Discharge Instructions     Call MD / Call 911   Complete by: As directed    If you experience chest pain or shortness of breath, CALL 911 and be transported to the hospital emergency room.  If you develope a fever above  101 F, pus (white drainage) or increased drainage or redness at the wound, or calf pain, call your surgeon's office.   Call MD / Call 911   Complete by: As directed    If you experience chest pain or shortness of breath, CALL 911 and be transported to the hospital emergency room.  If you develope a fever above 101 F, pus (white drainage) or increased drainage or redness at the wound, or calf pain, call your surgeon's office.  Constipation Prevention   Complete by: As directed    Drink plenty of fluids.  Prune juice may be helpful.  You may use a stool softener, such as Colace (over the counter) 100 mg twice a day.  Use MiraLax (over the counter) for constipation as needed.   Constipation Prevention   Complete by: As directed    Drink plenty of fluids.  Prune juice may be helpful.  You may use a stool softener, such as Colace (over the counter) 100 mg twice a day.  Use MiraLax (over the counter) for constipation as needed.   Diet - low sodium heart healthy   Complete by: As directed    Diet - low sodium heart healthy   Complete by: As directed    Driving restrictions   Complete by: As directed    No driving for 2 weeks   Increase activity slowly as tolerated   Complete by: As directed    Increase activity slowly as tolerated   Complete by: As directed    Patient may shower   Complete by: As directed    You may shower without a dressing once there is no drainage.  Do not wash over the wound.  If drainage remains, cover wound with plastic wrap and then shower.   Post-operative opioid taper instructions:   Complete by: As directed    POST-OPERATIVE OPIOID TAPER INSTRUCTIONS: It is important to wean off of your opioid medication as soon as possible. If you do not need pain medication after your surgery it is ok to stop day one. Opioids include: Codeine, Hydrocodone(Norco, Vicodin), Oxycodone(Percocet, oxycontin) and hydromorphone amongst others.  Long term and even short term use of opiods  can cause: Increased pain response Dependence Constipation Depression Respiratory depression And more.  Withdrawal symptoms can include Flu like symptoms Nausea, vomiting And more Techniques to manage these symptoms Hydrate well Eat regular healthy meals Stay active Use relaxation techniques(deep breathing, meditating, yoga) Do Not substitute Alcohol to help with tapering If you have been on opioids for less than two weeks and do not have pain than it is ok to stop all together.  Plan to wean off of opioids This plan should start within one week post op of your joint replacement. Maintain the same interval or time between taking each dose and first decrease the dose.  Cut the total daily intake of opioids by one tablet each day Next start to increase the time between doses. The last dose that should be eliminated is the evening dose.      Post-operative opioid taper instructions:   Complete by: As directed    POST-OPERATIVE OPIOID TAPER INSTRUCTIONS: It is important to wean off of your opioid medication as soon as possible. If you do not need pain medication after your surgery it is ok to stop day one. Opioids include: Codeine, Hydrocodone(Norco, Vicodin), Oxycodone(Percocet, oxycontin) and hydromorphone amongst others.  Long term and even short term use of opiods can cause: Increased pain response Dependence Constipation Depression Respiratory depression And more.  Withdrawal symptoms can include Flu like symptoms Nausea, vomiting And more Techniques to manage these symptoms Hydrate well Eat regular healthy meals Stay active Use relaxation techniques(deep breathing, meditating, yoga) Do Not substitute Alcohol to help with tapering If you have been on opioids for less than two weeks and do not have pain than it is ok to stop all together.  Plan to wean off of opioids This plan should start within one week post op of  your joint replacement. Maintain the same  interval or time between taking each dose and first decrease the dose.  Cut the total daily intake of opioids by one tablet each day Next start to increase the time between doses. The last dose that should be eliminated is the evening dose.      Weight bearing as tolerated   Complete by: As directed    Weight bearing as tolerated   Complete by: As directed         Follow-up Information     Jodi Geralds, MD Follow up in 2 week(s).   Specialty: Orthopedic Surgery Contact information: [address] [place] [address] [phone]                  Signed: Jiles Harold 05/30/2021, 8:12 AM

## 2021-05-30 NOTE — Progress Notes (Signed)
Patient discharged via wheelchair to friend Aurther Loft who will be taking her home via private vehicle.

## 2021-05-30 NOTE — TOC Transition Note (Addendum)
Transition of Care [facility]) - CM/SW Discharge Note  Patient Details  Name: Gina Richards MRN: [id number] Date of Birth: 01-Nov-1960  [facility] CM/SW Contact:  Ewing Schlein, LCSW Phone Number: 05/30/2021, 1:13 PM  Clinical Narrative: Patient is expected to discharge after working with PT. HHPT was recommended. Patient is agreeable to referral and reported she will need a rolling walker and 3N1. Patient asked about assistance at home. CSW explained patient will need to private pay for personal care services [facility]) or contact her Medicaid caseworker to get set up with PCS at home. Patient reported she could not private pay.  CSW made HHPT referral to Amy with [facility]. CSW made DME referral to [facility] with [employer]. [employer] to deliver walker and 3N1 to patient's room. TOC signing off.  Addendum: TOC notified patient is prearranged with [facility]. CSW notified Amy with [facility].  Final next level of care: Home w Home Health Services Barriers to Discharge: No Barriers Identified  Patient Goals and CMS Choice Patient states their goals for this hospitalization and ongoing recovery are:: Discharge home with HHPT CMS Medicare.gov Compare Post Acute Care list provided to:: Patient Choice offered to / list presented to : Patient  Discharge Plan and Services         DME Arranged: 3-N-1, Walker rolling DME Agency: [employer] Date DME Agency Contacted: 05/30/21 Time DME Agency Contacted: 1127 Representative spoke with at DME Agency: Francesco Sor Arranged: PT HH Agency: [facility] Date [facility] Agency Contacted: 05/30/21 Time HH Agency Contacted: [phone] Representative spoke with at [facility] Agency: Amy  Readmission Risk Interventions No flowsheet data found.

## 2021-05-30 NOTE — Progress Notes (Signed)
Discharge instructions reviewed with patient, all medications, orders and instructions reviewed and patient verbalizes understanding. Patient had pain medication and muscle relaxer brought to her by [facility] team. Rolling walker and 3 in-1 commode at bedside.   Awaiting arrival of friend who will be taking patient home.

## 2021-05-31 ENCOUNTER — Encounter (HOSPITAL_COMMUNITY): Payer: Self-pay | Admitting: Orthopedic Surgery

## 2021-09-27 ENCOUNTER — Encounter: Payer: Self-pay | Admitting: Neurology

## 2021-11-18 ENCOUNTER — Encounter: Payer: Self-pay | Admitting: Neurology

## 2021-11-18 ENCOUNTER — Ambulatory Visit (INDEPENDENT_AMBULATORY_CARE_PROVIDER_SITE_OTHER): Payer: 59 | Admitting: Neurology

## 2021-11-18 VITALS — BP 124/82 | HR 82 | Ht 62.0 in | Wt 169.0 lb

## 2021-11-18 DIAGNOSIS — M79642 Pain in left hand: Secondary | ICD-10-CM

## 2021-11-18 DIAGNOSIS — M79641 Pain in right hand: Secondary | ICD-10-CM | POA: Diagnosis not present

## 2021-11-18 NOTE — Progress Notes (Signed)
[facility] ?Neurology Division ?Clinic Note - Initial Visit ? ? ?Date: 11/18/21 ? ?Gina Richards ?MRN: [id number] ?DOB: 1960-10-13 ? ? ?Dear Gina BackKim Nguyen, NP: ? ?Thank you for your kind referral of Gina Richards for consultation of bilateral hand pain. Although her history is well known to you, please allow Gina Richards to reiterate it for the purpose of our medical record. The patient was accompanied to the clinic by self. ?  ? ?History of Present Illness: ?Gina BurlyVivian T Nilan is a 61 y.o. left-handed female with well-controlled diabetes mellitus, bipolar disorder, anxiety/depression, hypertension, and tobacco use presenting for evaluation of bilateral hand pain.  ? ?For the past several years, she has pain in both hands, worse on the left.  She describes the pain as constant "hurt", achy, stiffness, and burning.  Movement of the wrists and fingers triggers her pain.  She also has stiffness of the fingers.  She endorses some numbness/tingling which is present at night time, but her constant achy pain is her primary issue.  She has difficulty with using her hands because of the pain.  She denies radicular arm symptoms.  No numbness/tingling of the feet. She takes flexeril 10mg  TID, percocet 5/325mg  every 4 hours, and zanaflex 4mg  TID for pain.  She has tried OTC tylenol, aleve, and ibuprofen without significant benefit.  ? ?She was last working in 2022.  She is on disability.   ? ? ?Out-side paper records, electronic medical record, and images have been reviewed where available and summarized as:  ?Lab Results  ?Component Value Date  ? HGBA1C 6.3 (H) 05/27/2021  ? ? ?Past Medical History:  ?Diagnosis Date  ? Anxiety   ? Bipolar 1 disorder (HCC)   ? Depression   ? Diabetes mellitus without complication (HCC)   ? Hypertension   ? Pneumonia   ? Stroke [facility](HCC)   ? ? ?Past Surgical History:  ?Procedure Laterality Date  ? TOTAL HIP ARTHROPLASTY Right 05/27/2021  ? Procedure: TOTAL HIP ARTHROPLASTY ANTERIOR APPROACH;  Surgeon:  [name], John, MD;  Location: [facility];  Service: Orthopedics;  Laterality: Right;  ? ? ? ?Medications:  ?Outpatient Encounter Medications as of 11/18/2021  ?Medication Sig  ? cyclobenzaprine (FLEXERIL) 10 MG tablet Take 1 tablet (10 mg total) by mouth 3 (three) times daily as needed for muscle spasms. DO NOT DRIVE WHILE TAKING THIS MEDICATION  ? losartan-hydrochlorothiazide (HYZAAR) 50-12.5 MG tablet Take 1 tablet by mouth daily.  ? QUEtiapine Fumarate (SEROQUEL XR) 150 MG 24 hr tablet Take 150 mg by mouth daily.  ? tiZANidine (ZANAFLEX) 4 MG tablet Take 1 tablet (4 mg total) by mouth every 8 (eight) hours as needed for muscle spasms.  ? aspirin EC 325 MG tablet Take 1 tablet (325 mg total) by mouth 2 (two) times daily. (Patient not taking: Reported on 11/18/2021)  ? docusate sodium (COLACE) 100 MG capsule Take 1 capsule (100 mg total) by mouth 3 (three) times daily as needed. (Patient not taking: Reported on 11/18/2021)  ? hydrocortisone cream 1 % Apply 1 application topically 2 (two) times daily as needed (inflammation). (Patient not taking: Reported on 11/18/2021)  ? metFORMIN (GLUCOPHAGE) 500 MG tablet Take 500 mg by mouth 2 (two) times daily. (Patient not taking: Reported on 11/18/2021)  ? oxyCODONE-acetaminophen (PERCOCET) 5-325 MG tablet Take 1-2 tablets every 4 hours as needed for post operative pain. MAX 6/day (Patient not taking: Reported on 11/18/2021)  ? [DISCONTINUED] escitalopram (LEXAPRO) 20 MG tablet Take 1 tablet (20 mg total) by mouth  daily.  ? [DISCONTINUED] promethazine (PHENERGAN) 25 MG tablet Take 1 tablet (25 mg total) by mouth every 8 (eight) hours as needed for nausea or vomiting. (Patient not taking: Reported on 05/10/2015)  ? [DISCONTINUED] traZODone (DESYREL) 50 MG tablet Take 50 mg by mouth at bedtime.  ? ?No facility-administered encounter medications on file as of 11/18/2021.  ? ? ?Allergies: No Known Allergies ? ?Family History: ?Family History  ?Family history unknown: Yes  ? ? ?Social  History: ?Social History  ? ?Tobacco Use  ? Smoking status: Every Day  ?  Packs/day: 1.00  ?  Types: Cigarettes  ? Smokeless tobacco: Never  ?Vaping Use  ? Vaping Use: Never used  ?Substance Use Topics  ? Alcohol use: Yes  ?  Comment: One beer but she will not inform how often.   ? Drug use: No  ? ?Social History  ? ?Social History Narrative  ? Right Handed   ? Lives in a two story home  ? ? ?Vital Signs:  ?BP 124/82   Pulse 82   Ht 5\' 2"  (1.575 m)   Wt 169 lb (76.7 kg)   SpO2 100%   BMI 30.91 kg/m?  ? ? ?Neurological Exam: ?MENTAL STATUS including orientation to time, place, person, recent and remote memory, attention span and concentration, language, and fund of knowledge is normal.  Speech is not dysarthric. ? ?CRANIAL NERVES: ?II:  No visual field defects.  ?III-IV-VI: Pupils equal round and reactive to light.  Normal conjugate, extra-ocular eye movements in all directions of gaze.  No nystagmus.  No ptosis.   ?V:  Normal facial sensation.    ?VII:  Normal facial symmetry and movements.   ?VIII:  Normal hearing and vestibular function.   ?IX-X:  Normal palatal movement.   ?XI:  Normal shoulder shrug and head rotation.   ?XII:  Normal tongue strength and range of motion, no deviation or fasciculation. ? ?MOTOR:  Pain-limiting weakness in the hands, she has 5/5 motor strength proximally and at least 4/5 distally in the hands.  Motor strength in the legs is 5/5.  She has pain with palpation over the wrist and fingers.  No atrophy, fasciculations or abnormal movements.  No pronator drift.  ? ?MSRs:  ?Right        Left                  ?brachioradialis 2+  2+  ?biceps 2+  2+  ?triceps 2+  2+  ?patellar 2+  2+  ?ankle jerk 2+  2+  ?Hoffman no  no  ?plantar response down  down  ? ?SENSORY: Vibration absent in the hands. Temperature and pin prick is intact in the hands.  Sensation to all modalities is normal in the legs. ? ?COORDINATION/GAIT: Normal finger-to- nose-finger.  Intact rapid alternating movements  bilaterally.   Gait narrow based and stable.  ? ? ?IMPRESSION: ?Bilateral hand pain, described more as achy/throbbing/stiffness, suggestive of musculoskeletal pathology, moreso than nerve-mediated.  She has some numbness/tingling, which may be due to entrapment neuropathy, however, this is not her primary complaint.  To further investigate her paresthesias, I offered NCS/EMG of the hands.  I am not sure how well she will tolerate testing because she is already describing significant pain in the hands on light palpation of her hands.  I will also refer her to orthopeadics to evaluate her hand pain further.   ? ?Patient did not seem satisfied with my recommendations and at check-out became verbally disrespectful  and inappropriate towards my staff, making other patients and staff uncomfortable.  She will be dismissed from in our clinic for her behavior. ? ? ?Thank you for allowing me to participate in patient's care.  If I can answer any additional questions, I would be pleased to do so.   ? ?Sincerely, ? ? ? ?Lavere Shinsky K. Allena Katz, DO ? ?

## 2021-11-18 NOTE — Patient Instructions (Addendum)
Referral to orthopaedics to evaluate your hand pain and stiffness. ?2.   Nerve testing of the hands ? ?ELECTROMYOGRAM AND NERVE CONDUCTION STUDIES (EMG/NCS) INSTRUCTIONS ? ?How to Prepare ?The neurologist conducting the EMG will need to know if you have certain medical conditions. Tell the neurologist and other EMG lab personnel if you: ?Have a pacemaker or any other electrical medical device ?Take blood-thinning medications ?Have hemophilia, a blood-clotting disorder that causes prolonged bleeding ?Bathing ?Take a shower or bath shortly before your exam in order to remove oils from your skin. Don?t apply lotions or creams before the exam.  ?What to Expect ?You?ll likely be asked to change into a hospital gown for the procedure and lie down on an examination table. The following explanations can help you understand what will happen during the exam.  ?Electrodes. The neurologist or a technician places surface electrodes at various locations on your skin depending on where you?re experiencing symptoms. Or the neurologist may insert needle electrodes at different sites depending on your symptoms.  ?Sensations. The electrodes will at times transmit a tiny electrical current that you may feel as a twinge or spasm. The needle electrode may cause discomfort or pain that usually ends shortly after the needle is removed. ?If you are concerned about discomfort or pain, you may want to talk to the neurologist about taking a short break during the exam.  ?Instructions. During the needle EMG, the neurologist will assess whether there is any spontaneous electrical activity when the muscle is at rest - activity that isn?t present in healthy muscle tissue - and the degree of activity when you slightly contract the muscle.  ?He or she will give you instructions on resting and contracting a muscle at appropriate times. Depending on what muscles and nerves the neurologist is examining, he or she may ask you to change positions during  the exam.  ?After your EMG ?You may experience some temporary, minor bruising where the needle electrode was inserted into your muscle. This bruising should fade within several days. If it persists, contact your primary care doctor.  ? ?

## 2021-11-22 ENCOUNTER — Ambulatory Visit: Payer: 59 | Admitting: Podiatry

## 2021-11-23 ENCOUNTER — Telehealth: Payer: Self-pay | Admitting: Neurology

## 2021-11-23 NOTE — Telephone Encounter (Signed)
Patient dismissed from [facility] by Nita Sickle, DO, effective 11/18/21. Dismissal Letter sent out by 1st class mail. [employer]LM   ?

## 2021-12-02 ENCOUNTER — Ambulatory Visit: Payer: 59 | Admitting: Orthopedic Surgery

## 2021-12-07 ENCOUNTER — Encounter: Payer: Self-pay | Admitting: Podiatry

## 2021-12-07 ENCOUNTER — Ambulatory Visit (INDEPENDENT_AMBULATORY_CARE_PROVIDER_SITE_OTHER): Payer: Medicare Other | Admitting: Podiatry

## 2021-12-07 ENCOUNTER — Other Ambulatory Visit: Payer: Self-pay | Admitting: *Deleted

## 2021-12-07 DIAGNOSIS — E119 Type 2 diabetes mellitus without complications: Secondary | ICD-10-CM

## 2021-12-07 DIAGNOSIS — L84 Corns and callosities: Secondary | ICD-10-CM

## 2021-12-07 NOTE — Progress Notes (Signed)
?  Subjective:  ?Patient ID: Gina Richards, female    DOB: 1961-01-02,   MRN: [id number] ? ?Chief Complaint  ?Patient presents with  ? Callouses  ?  I have some calluses on the bottom of both feet and on the left big toe area  ? ? ?61 y.o. female presents for concern of calluses on the bottom of both of her feet. Relates she has not tried any treatments but they are very painful. She is diabetic and her last A1c was 6.3. Denies any other pedal complaints. Denies n/v/f/c.  ? ?Past Medical History:  ?Diagnosis Date  ? Anxiety   ? Bipolar 1 disorder (HCC)   ? Depression   ? Diabetes mellitus without complication (HCC)   ? Hypertension   ? Pneumonia   ? Stroke [facility])   ? ? ?Objective:  ?Physical Exam: ?Vascular: DP/PT pulses 2/4 bilateral. CFT <3 seconds. Normal hair growth on digits. No edema.  ?Skin. No lacerations or abrasions bilateral feet. Hyperkeratotic tissue noted sub first and fifth metatarsals bilateral as well as left medial hallux.  ?Musculoskeletal: MMT 5/5 bilateral lower extremities in DF, PF, Inversion and Eversion. Deceased ROM in DF of ankle joint.  ?Neurological: Sensation intact to light touch.  ? ?Assessment:  ? ?1. Type 2 diabetes mellitus without complication, unspecified whether long term insulin use (HCC)   ?2. Calluse   ? ? ? ?Plan:  ?Patient was evaluated and treated and all questions answered. ?-Discussed and educated patient on diabetic foot care, especially with  ?regards to the vascular, neurological and musculoskeletal systems.  ?-Stressed the importance of good glycemic control and the detriment of not  ?controlling glucose levels in relation to the foot. ?-Discussed supportive shoes at all times and checking feet regularly.  ?-Discussed corns and calluses with patient and treatment options.  ?-Hyperkeratotic tissue was debrided with chisel without incident.  ?-Applied salycylic acid treatment to area with dressing. Advised to remove bandaging tomorrow.  ?-Encouraged daily  moisturizing ?-Discussed use of pumice stone ?-Advised good supportive shoes and inserts ?-Patient to return to office as needed or sooner if condition worsens. ? ? ?Louann Sjogren, DPM  ? ? ?

## 2022-01-10 ENCOUNTER — Ambulatory Visit (INDEPENDENT_AMBULATORY_CARE_PROVIDER_SITE_OTHER): Payer: Medicare Other | Admitting: Podiatry

## 2022-01-10 DIAGNOSIS — Z91199 Patient's noncompliance with other medical treatment and regimen due to unspecified reason: Secondary | ICD-10-CM

## 2022-01-10 NOTE — Progress Notes (Signed)
No show

## 2022-01-25 ENCOUNTER — Encounter (HOSPITAL_COMMUNITY): Payer: Self-pay

## 2022-01-25 ENCOUNTER — Emergency Department (HOSPITAL_COMMUNITY): Payer: Medicare Other

## 2022-01-25 ENCOUNTER — Other Ambulatory Visit: Payer: Self-pay

## 2022-01-25 ENCOUNTER — Emergency Department (HOSPITAL_COMMUNITY)
Admission: EM | Admit: 2022-01-25 | Discharge: 2022-01-25 | Discharge status: Against Medical Advice | Payer: Medicare Other | Attending: Emergency Medicine | Admitting: Emergency Medicine

## 2022-01-25 DIAGNOSIS — Z5321 Procedure and treatment not carried out due to patient leaving prior to being seen by health care provider: Secondary | ICD-10-CM | POA: Diagnosis not present

## 2022-01-25 DIAGNOSIS — K5900 Constipation, unspecified: Secondary | ICD-10-CM | POA: Insufficient documentation

## 2022-01-25 LAB — COMPREHENSIVE METABOLIC PANEL
ALT: 15 U/L (ref 0–44)
AST: 18 U/L (ref 15–41)
Albumin: 4.3 g/dL (ref 3.5–5.0)
Alkaline Phosphatase: 112 U/L (ref 38–126)
Anion gap: 10 (ref 5–15)
BUN: 14 mg/dL (ref 8–23)
CO2: 24 mmol/L (ref 22–32)
Calcium: 9.1 mg/dL (ref 8.9–10.3)
Chloride: 106 mmol/L (ref 98–111)
Creatinine, Ser: 0.64 mg/dL (ref 0.44–1.00)
GFR, Estimated: >60 mL/min (ref >60)
Glucose, Bld: 112 mg/dL — ABNORMAL HIGH (ref 70–99)
Potassium: 3.4 mmol/L — ABNORMAL LOW (ref 3.5–5.1)
Sodium: 140 mmol/L (ref 135–145)
Total Bilirubin: 0.6 mg/dL (ref 0.3–1.2)
Total Protein: 8.3 g/dL — ABNORMAL HIGH (ref 6.5–8.1)

## 2022-01-25 LAB — CBC
HCT: 38.4 % (ref 36.0–46.0)
Hemoglobin: 12.8 g/dL (ref 12.0–15.0)
MCH: 31.3 pg (ref 26.0–34.0)
MCHC: 33.3 g/dL (ref 30.0–36.0)
MCV: 93.9 fL (ref 80.0–100.0)
Platelets: 389 10*3/uL (ref 150–400)
RBC: 4.09 MIL/uL (ref 3.87–5.11)
RDW: 15.9 % — ABNORMAL HIGH (ref 11.5–15.5)
WBC: 7.6 10*3/uL (ref 4.0–10.5)
nRBC: 0 % (ref 0.0–0.2)

## 2022-01-25 NOTE — ED Triage Notes (Signed)
Pt reports being unable to have a bowel movement for about 2 weeks. Pt reports it feels like there is stool in her rectum but it will not come out. Pt reports trying laxatives without relief. Endorses rectum pain, but denies abdominal pain.

## 2022-01-25 NOTE — ED Notes (Signed)
Pt have been in lobby cussing out loud due to wait time since 1900. State "I am leaving fuck this place staff never know shit". PT seen walking out front door of ED. Triage RN aware of PT exit

## 2022-01-25 NOTE — ED Provider Triage Note (Signed)
Emergency Medicine Provider Triage Evaluation Note  SHERELL CHRISTOFFEL , a 61 y.o. female  was evaluated in triage.  Pt complains of constipation. She states that she has been unable to have a bowel movement for 2 weeks.  She states she has tried laxatives and suppositories without success.  She states she feels that the stool is stuck at the opening of her rectum and is causing significant pain in the area.  She denies any abdominal pain, nausea, or vomiting.  Review of Systems  Positive:  Negative: See above  Physical Exam  BP (!) 149/113 (BP Location: Left Arm)   Pulse 96   Temp 98.5 F (36.9 C) (Oral)   Resp 18   Ht 5' 2.5" (1.588 m)   Wt 72.6 kg   SpO2 94%   BMI 28.80 kg/m  Gen:   Awake, no distress   Resp:  Normal effort  MSK:   Moves extremities without difficulty  Other:    Medical Decision Making  Medically screening exam initiated at 2:35 PM.  Appropriate orders placed.  Rashi Granier Savarese was informed that the remainder of the evaluation will be completed by another provider, this initial triage assessment does not replace that evaluation, and the importance of remaining in the ED until their evaluation is complete.     Silva Bandy, PA-C 01/25/22 1436

## 2022-04-11 ENCOUNTER — Encounter: Payer: 59 | Admitting: Neurology

## 2022-07-02 ENCOUNTER — Other Ambulatory Visit (HOSPITAL_COMMUNITY): Payer: 59

## 2022-07-02 ENCOUNTER — Observation Stay (HOSPITAL_COMMUNITY)
Admission: EM | Admit: 2022-07-02 | Discharge: 2022-07-03 | Disposition: A | Discharge status: Home / Self Care | Payer: 59 | Attending: Family Medicine | Admitting: Family Medicine

## 2022-07-02 ENCOUNTER — Emergency Department (HOSPITAL_COMMUNITY): Payer: 59

## 2022-07-02 ENCOUNTER — Other Ambulatory Visit: Payer: Self-pay

## 2022-07-02 DIAGNOSIS — I10 Essential (primary) hypertension: Secondary | ICD-10-CM | POA: Insufficient documentation

## 2022-07-02 DIAGNOSIS — Z87891 Personal history of nicotine dependence: Secondary | ICD-10-CM | POA: Diagnosis not present

## 2022-07-02 DIAGNOSIS — T40601A Poisoning by unspecified narcotics, accidental (unintentional), initial encounter: Secondary | ICD-10-CM | POA: Insufficient documentation

## 2022-07-02 DIAGNOSIS — T383X1A Poisoning by insulin and oral hypoglycemic [antidiabetic] drugs, accidental (unintentional), initial encounter: Secondary | ICD-10-CM | POA: Insufficient documentation

## 2022-07-02 DIAGNOSIS — N179 Acute kidney failure, unspecified: Principal | ICD-10-CM | POA: Diagnosis present

## 2022-07-02 DIAGNOSIS — T466X1A Poisoning by antihyperlipidemic and antiarteriosclerotic drugs, accidental (unintentional), initial encounter: Secondary | ICD-10-CM | POA: Insufficient documentation

## 2022-07-02 DIAGNOSIS — Z79899 Other long term (current) drug therapy: Secondary | ICD-10-CM | POA: Diagnosis not present

## 2022-07-02 DIAGNOSIS — R2689 Other abnormalities of gait and mobility: Secondary | ICD-10-CM | POA: Diagnosis not present

## 2022-07-02 DIAGNOSIS — T426X1A Poisoning by other antiepileptic and sedative-hypnotic drugs, accidental (unintentional), initial encounter: Secondary | ICD-10-CM | POA: Diagnosis not present

## 2022-07-02 DIAGNOSIS — E876 Hypokalemia: Secondary | ICD-10-CM | POA: Insufficient documentation

## 2022-07-02 DIAGNOSIS — T43011A Poisoning by tricyclic antidepressants, accidental (unintentional), initial encounter: Secondary | ICD-10-CM | POA: Diagnosis present

## 2022-07-02 DIAGNOSIS — E119 Type 2 diabetes mellitus without complications: Secondary | ICD-10-CM | POA: Diagnosis not present

## 2022-07-02 DIAGNOSIS — Z7984 Long term (current) use of oral hypoglycemic drugs: Secondary | ICD-10-CM | POA: Insufficient documentation

## 2022-07-02 DIAGNOSIS — R2681 Unsteadiness on feet: Secondary | ICD-10-CM | POA: Insufficient documentation

## 2022-07-02 DIAGNOSIS — T43591A Poisoning by other antipsychotics and neuroleptics, accidental (unintentional), initial encounter: Secondary | ICD-10-CM | POA: Diagnosis not present

## 2022-07-02 DIAGNOSIS — Z7982 Long term (current) use of aspirin: Secondary | ICD-10-CM | POA: Insufficient documentation

## 2022-07-02 DIAGNOSIS — T50901A Poisoning by unspecified drugs, medicaments and biological substances, accidental (unintentional), initial encounter: Secondary | ICD-10-CM

## 2022-07-02 DIAGNOSIS — T428X1A Poisoning by antiparkinsonism drugs and other central muscle-tone depressants, accidental (unintentional), initial encounter: Secondary | ICD-10-CM | POA: Insufficient documentation

## 2022-07-02 LAB — COMPREHENSIVE METABOLIC PANEL
ALT: 11 U/L (ref 0–44)
AST: 23 U/L (ref 15–41)
Albumin: 3.5 g/dL (ref 3.5–5.0)
Alkaline Phosphatase: 71 U/L (ref 38–126)
Anion gap: 9 (ref 5–15)
BUN: 18 mg/dL (ref 8–23)
CO2: 26 mmol/L (ref 22–32)
Calcium: 8.6 mg/dL — ABNORMAL LOW (ref 8.9–10.3)
Chloride: 104 mmol/L (ref 98–111)
Creatinine, Ser: 1.3 mg/dL — ABNORMAL HIGH (ref 0.44–1.00)
GFR, Estimated: 46 mL/min — ABNORMAL LOW (ref >60)
Glucose, Bld: 204 mg/dL — ABNORMAL HIGH (ref 70–99)
Potassium: 3.2 mmol/L — ABNORMAL LOW (ref 3.5–5.1)
Sodium: 139 mmol/L (ref 135–145)
Total Bilirubin: 0.9 mg/dL (ref 0.3–1.2)
Total Protein: 6.2 g/dL — ABNORMAL LOW (ref 6.5–8.1)

## 2022-07-02 LAB — ETHANOL: Alcohol, Ethyl (B): <10 mg/dL (ref <10)

## 2022-07-02 LAB — CBC WITH DIFFERENTIAL/PLATELET
Abs Immature Granulocytes: 0.01 10*3/uL (ref 0.00–0.07)
Basophils Absolute: 0 10*3/uL (ref 0.0–0.1)
Basophils Relative: 0 %
Eosinophils Absolute: 0.2 10*3/uL (ref 0.0–0.5)
Eosinophils Relative: 4 %
HCT: 37.6 % (ref 36.0–46.0)
Hemoglobin: 12.6 g/dL (ref 12.0–15.0)
Immature Granulocytes: 0 %
Lymphocytes Relative: 37 %
Lymphs Abs: 1.8 10*3/uL (ref 0.7–4.0)
MCH: 31.9 pg (ref 26.0–34.0)
MCHC: 33.5 g/dL (ref 30.0–36.0)
MCV: 95.2 fL (ref 80.0–100.0)
Monocytes Absolute: 0.8 10*3/uL (ref 0.1–1.0)
Monocytes Relative: 16 %
Neutro Abs: 2.1 10*3/uL (ref 1.7–7.7)
Neutrophils Relative %: 43 %
Platelets: 270 10*3/uL (ref 150–400)
RBC: 3.95 MIL/uL (ref 3.87–5.11)
RDW: 15.7 % — ABNORMAL HIGH (ref 11.5–15.5)
WBC: 5 10*3/uL (ref 4.0–10.5)
nRBC: 0 % (ref 0.0–0.2)

## 2022-07-02 LAB — ACETAMINOPHEN LEVEL: Acetaminophen (Tylenol), Serum: <10 ug/mL — ABNORMAL LOW (ref 10–30)

## 2022-07-02 LAB — MAGNESIUM: Magnesium: 2 mg/dL (ref 1.7–2.4)

## 2022-07-02 LAB — SALICYLATE LEVEL: Salicylate Lvl: <7 mg/dL — ABNORMAL LOW (ref 7.0–30.0)

## 2022-07-02 LAB — CBG MONITORING, ED
Glucose-Capillary: 231 mg/dL — ABNORMAL HIGH (ref 70–99)
Glucose-Capillary: 78 mg/dL (ref 70–99)

## 2022-07-02 LAB — TSH: TSH: 0.921 u[IU]/mL (ref 0.350–4.500)

## 2022-07-02 MED ORDER — INSULIN ASPART 100 UNIT/ML IJ SOLN: 0.0000 [IU] | Freq: Three times a day (TID) | INTRAMUSCULAR | Status: DC

## 2022-07-02 MED ORDER — ENOXAPARIN SODIUM 40 MG/0.4ML IJ SOSY
40.0000 mg | PREFILLED_SYRINGE | INTRAMUSCULAR | Status: DC
  Administered 2022-07-02: 40 mg via SUBCUTANEOUS
  Filled 2022-07-02: qty 0.4

## 2022-07-02 MED ORDER — POTASSIUM CHLORIDE CRYS ER 20 MEQ PO TBCR: 40.0000 meq | EXTENDED_RELEASE_TABLET | Freq: Once | ORAL | Status: DC

## 2022-07-02 MED ORDER — ACETAMINOPHEN 650 MG RE SUPP: 650.0000 mg | Freq: Four times a day (QID) | RECTAL | Status: DC | PRN

## 2022-07-02 MED ORDER — LACTATED RINGERS IV BOLUS
1000.0000 mL | Freq: Once | INTRAVENOUS | Status: AC
  Administered 2022-07-02: 1000 mL via INTRAVENOUS

## 2022-07-02 MED ORDER — LACTATED RINGERS IV SOLN: INTRAVENOUS | Status: DC

## 2022-07-02 MED ORDER — ACETAMINOPHEN 325 MG PO TABS: 650.0000 mg | ORAL_TABLET | Freq: Four times a day (QID) | ORAL | Status: DC | PRN

## 2022-07-02 MED ORDER — POTASSIUM CHLORIDE 10 MEQ/100ML IV SOLN
10.0000 meq | INTRAVENOUS | Status: AC
  Administered 2022-07-02 (×4): 10 meq via INTRAVENOUS
  Filled 2022-07-02 (×4): qty 100

## 2022-07-02 NOTE — Hospital Course (Signed)
Gina Richards is a 61 y.o. female who was admitted due to an AKI in the setting of drug overdose. PMH significant for anxiety, bipolar depression, T2DM, HTN.  Overdose Patient presented with somnolence, weakness, and softer BP with concern for stroke initially. Patient admitted to taking extra doses of all home medications including amitriptyline, atorvastatin, gabapentin, glipizide, norco, hyzar, metformin, seroquel, zanaflex, and potassium. Poison control was not called, as ingestion had occurred 12 hours prior. After admission on fluids, her mental status and vitals resolved.  AKI Creatinine elevated to 1.3 on admission in the setting of drug overdose and decreased oral intake. Patient was given IVF with improvement of creatinine to 0.72 upon discharge.   Hypokalemia Hypokalemic on admission and repleted with appropriate potassium supplementation.  T2DM Home metformin and glipizide help upon admission given AKI and overdose including these medications. Patient was given sliding scale insulin for glucose control. Home glipizide was held on discharge. Instructed to follow up with PCP for adjustments.   Issues for follow-up: Repeat BMP to monitor creatinine and electrolyte levels Follow up diabetes control, home glipizide held. Reassess and adjust as appropriate.  Ensure outpatient follow up with therapy resources

## 2022-07-02 NOTE — ED Provider Notes (Signed)
[facility] EMERGENCY DEPARTMENT Provider Note   CSN: [id number] Arrival date & time: 07/02/22  1456     History  Chief Complaint  Patient presents with   Drug Overdose    Gina Richards is a 61 y.o. female.   Drug Overdose  Patient presents for overdose.  Medical history includes anxiety, bipolar disorder, depression, DM, HTN, polysubstance abuse.  Last night, she got done with her shift working at [employer] late.  When she went home, she was upset at her boyfriend.  She states that she took extra doses of all of her home medications.  Per chart review, this includes amitriptyline, atorvastatin, gabapentin, glipizide, Norco, Hyzaar, metformin, potassium supplement, Seroquel, and Zanaflex.  She denies attempt of self-harm.  She states that she just wanted to go to sleep.  Patient did end up going to sleep.  When she woke up today, she felt fatigued and generalized weakness.  Boyfriend called EMS due to concern of acute stroke.  Currently, patient states that her speech seems mildly slurred to her.  She denies any areas of numbness or weakness.  She does endorse pain in both hands which has been ongoing for the past several months.     Home Medications Prior to Admission medications   Medication Sig Start Date End Date Taking? Authorizing Provider  amitriptyline (ELAVIL) 25 MG tablet Take 25 mg by mouth at bedtime. 07/04/21   [provider]  aspirin EC 325 MG tablet Take 1 tablet (325 mg total) by mouth 2 (two) times daily. Patient not taking: Reported on 11/18/2021 05/27/21   Grier Mitts, PA-C  atorvastatin (LIPITOR) 20 MG tablet Take 20 mg by mouth at bedtime. 12/01/21   [provider]  CARESTART COVID-19 HOME TEST KIT See admin instructions. 11/09/21   [provider]  cyclobenzaprine (FLEXERIL) 10 MG tablet Take 1 tablet (10 mg total) by mouth 3 (three) times daily as needed for muscle spasms. DO NOT DRIVE WHILE TAKING THIS  MEDICATION 06/14/20   Lamptey, Myrene Galas, MD  docusate sodium (COLACE) 100 MG capsule Take 1 capsule (100 mg total) by mouth 3 (three) times daily as needed. Patient not taking: Reported on 11/18/2021 05/27/21   Grier Mitts, PA-C  fluorometholone (FML) 0.1 % ophthalmic suspension SMARTSIG:In Eye(s) 11/15/21   [provider]  gabapentin (NEURONTIN) 800 MG tablet Take 800 mg by mouth 2 (two) times daily. 12/01/21   [provider]  glipiZIDE (GLUCOTROL XL) 10 MG 24 hr tablet Take 10 mg by mouth every morning. 09/19/21   [provider]  HYDROcodone-acetaminophen (NORCO/VICODIN) 5-325 MG tablet SMARTSIG:1 Tablet(s) By Mouth Every 12 Hours PRN 07/04/21   [provider]  hydrocortisone cream 1 % Apply 1 application topically 2 (two) times daily as needed (inflammation). Patient not taking: Reported on 11/18/2021    [provider]  hydrOXYzine (ATARAX) 50 MG tablet SMARTSIG:1-2 Tablet(s) By Mouth 11/29/21   [provider]  losartan-hydrochlorothiazide (HYZAAR) 50-12.5 MG tablet Take 1 tablet by mouth daily. 03/14/21   [provider]  metFORMIN (GLUCOPHAGE) 500 MG tablet Take 500 mg by mouth 2 (two) times daily. Patient not taking: Reported on 11/18/2021 04/07/21   [provider]  oxyCODONE-acetaminophen (PERCOCET) 5-325 MG tablet Take 1-2 tablets every 4 hours as needed for post operative pain. MAX 6/day Patient not taking: Reported on 11/18/2021 05/27/21   Grier Mitts, PA-C  potassium chloride SA (KLOR-CON M) 20 MEQ tablet Take 20 mEq by mouth daily. 11/15/21  [provider]  QUEtiapine Fumarate (SEROQUEL XR) 150 MG 24 hr tablet Take 150 mg by mouth daily. 05/09/21   [provider]  RESTASIS 0.05 % ophthalmic emulsion  12/01/21   [provider]  tiZANidine (ZANAFLEX) 4 MG tablet Take 1 tablet (4 mg total) by mouth every 8 (eight) hours as needed for muscle spasms. 05/27/21   Grier Mitts,  PA-C  escitalopram (LEXAPRO) 20 MG tablet Take 1 tablet (20 mg total) by mouth daily. 04/19/19 01/10/20  Vanessa Kick, MD  promethazine (PHENERGAN) 25 MG tablet Take 1 tablet (25 mg total) by mouth every 8 (eight) hours as needed for nausea or vomiting. Patient not taking: Reported on 05/10/2015 11/10/14 06/15/15  Dalia Heading, PA-C  traZODone (DESYREL) 50 MG tablet Take 50 mg by mouth at bedtime. 03/13/17 04/19/19  [provider]      Allergies    Patient has no known allergies.    Review of Systems   Review of Systems  Constitutional:  Positive for fatigue.  Musculoskeletal:  Positive for arthralgias.  Neurological:  Positive for speech difficulty and weakness (Generalized).  All other systems reviewed and are negative.   Physical Exam Updated Vital Signs Temp 98 F (36.7 C) (Oral)  Physical Exam Vitals and nursing note reviewed.  Constitutional:      General: She is not in acute distress.    Appearance: Normal appearance. She is well-developed and normal weight. She is not ill-appearing, toxic-appearing or diaphoretic.  HENT:     Head: Normocephalic and atraumatic.     Right Ear: External ear normal.     Left Ear: External ear normal.     Nose: Nose normal.     Mouth/Throat:     Mouth: Mucous membranes are moist.     Pharynx: Oropharynx is clear.  Eyes:     Extraocular Movements: Extraocular movements intact.     Conjunctiva/sclera: Conjunctivae normal.  Cardiovascular:     Rate and Rhythm: Normal rate and regular rhythm.     Heart sounds: No murmur heard. Pulmonary:     Effort: Pulmonary effort is normal. No respiratory distress.     Breath sounds: Normal breath sounds. No wheezing or rales.  Chest:     Chest wall: No tenderness.  Abdominal:     General: There is no distension.     Palpations: Abdomen is soft.     Tenderness: There is no abdominal tenderness.  Musculoskeletal:        General: No swelling. Normal range of motion.     Cervical back:  Normal range of motion and neck supple.     Right lower leg: No edema.     Left lower leg: No edema.  Skin:    General: Skin is warm and dry.     Coloration: Skin is not jaundiced or pale.  Neurological:     Mental Status: She is alert and oriented to person, place, and time.     Cranial Nerves: No cranial nerve deficit or facial asymmetry.     Sensory: Sensation is intact. No sensory deficit.     Motor: Motor function is intact. No abnormal muscle tone or pronator drift.     Coordination: Coordination is intact.  Psychiatric:        Mood and Affect: Mood and affect normal.        Speech: Speech is slurred.        Behavior: Behavior normal. Behavior is cooperative.        Thought  Content: Thought content normal.     ED Results / Procedures / Treatments   Labs (all labs ordered are listed, but only abnormal results are displayed) Labs Reviewed  COMPREHENSIVE METABOLIC PANEL - Abnormal; Notable for the following components:      Result Value   Potassium 3.2 (*)    Glucose, Bld 204 (*)    Creatinine, Ser 1.30 (*)    Calcium 8.6 (*)    Total Protein 6.2 (*)    GFR, Estimated 46 (*)    All other components within normal limits  SALICYLATE LEVEL - Abnormal; Notable for the following components:   Salicylate Lvl <2.0 (*)    All other components within normal limits  ACETAMINOPHEN LEVEL - Abnormal; Notable for the following components:   Acetaminophen (Tylenol), Serum <10 (*)    All other components within normal limits  CBC WITH DIFFERENTIAL/PLATELET - Abnormal; Notable for the following components:   RDW 15.7 (*)    All other components within normal limits  CBG MONITORING, ED - Abnormal; Notable for the following components:   Glucose-Capillary 231 (*)    All other components within normal limits  ETHANOL  MAGNESIUM  RAPID URINE DRUG SCREEN, HOSP PERFORMED  TSH    EKG EKG Interpretation  Date/Time:  Sunday July 02 2022 15:25:34 EST Ventricular Rate:  69 PR  Interval:  169 QRS Duration: 180 QT Interval:  550 QTC Calculation: 590 R Axis:   -79 Text Interpretation: Sinus rhythm Probable left atrial enlargement RBBB and LAFB No significant change since last tracing Confirmed by Godfrey Pick [phone]) on 07/02/2022 3:43:34 PM  Radiology MR BRAIN WO CONTRAST  Result Date: 07/02/2022 CLINICAL DATA:  Neuro deficit, acute, stroke suspected EXAM: MRI HEAD WITHOUT CONTRAST TECHNIQUE: Multiplanar, multiecho pulse sequences of the brain and surrounding structures were obtained without intravenous contrast. COMPARISON:  CT head 05/11/2020. FINDINGS: Brain: No acute infarction, hemorrhage, hydrocephalus, extra-axial collection or mass lesion. Mild to moderate T2/FLAIR hyperintensity in the white matter, nonspecific but compatible with chronic microvascular ischemic disease Vascular: Major arterial flow voids are maintained at the skull base. Skull and upper cervical spine: Normal marrow signal. Sinuses/Orbits: Clear sinuses.  No acute orbital findings. Other: No mastoid effusions. IMPRESSION: No evidence of acute intracranial abnormality. Electronically Signed   By: Margaretha Sheffield M.D.   On: 07/02/2022 17:13    Procedures Procedures    Medications Ordered in ED Medications  lactated ringers infusion (has no administration in time range)  lactated ringers bolus 1,000 mL (1,000 mLs Intravenous New Bag/Given 07/02/22 1547)    ED Course/ Medical Decision Making/ A&P                           Medical Decision Making Amount and/or Complexity of Data Reviewed Labs: ordered. Radiology: ordered.  Risk Prescription drug management.   This patient presents to the ED for concern of slurred speech, this involves an extensive number of treatment options, and is a complaint that carries with it a high risk of complications and morbidity.  The differential diagnosis includes VA, TIA, seizure, polypharmacy, hypoglycemia, hypertension, intoxication   Co morbidities  that complicate the patient evaluation  anxiety, bipolar disorder, depression, DM, HTN, polysubstance abuse   Additional history obtained:  Additional history obtained from EMS External records from outside source obtained and reviewed including EMR   Lab Tests:  I Ordered, and personally interpreted labs.  The pertinent results include: Creatinine elevated 2X baseline.  Although there  was report of possible overdose on home potassium supplement, current potassium is slightly low.  Salicylate and Tylenol levels are undetectable.  Patient has a mild hyperglycemia.  No leukocytosis is present.   Imaging Studies ordered:  I ordered imaging studies including MRI brain I independently visualized and interpreted imaging which showed no acute findings I agree with the radiologist interpretation   Cardiac Monitoring: / EKG:  The patient was maintained on a cardiac monitor.  I personally viewed and interpreted the cardiac monitored which showed an underlying rhythm of: Sinus rhythm   Problem List / ED Course / Critical interventions / Medication management  Patient is a 61 year old female presenting from home for concern of strokelike symptoms.  This was noticed by her significant other who called EMS.  Patient reports that she took extra doses of her home medications last night.  She denies this was an attempt of self-harm and states that she simply wanted to go to sleep.  Currently, she endorses fatigue and generalized weakness.  She states that her speech does not seem normal to her.  She denies any recent alcohol or recreational drug use.  Vital signs with EMS were notable for hypotension.  Patient received 1 L IVF prior to arrival.  On arrival, blood pressure remains in the range of 80s over 60s.  Additional IV fluids were ordered.  Laboratory work-up was initiated.  Given her slurred speech, patient underwent MRI for possible stroke.  Results of MRI were negative.  Lab results are notable  for AKI with current creatinine 2 times are normal.  I suspect this is from hypotension secondary to polypharmacy.  Patient to be admitted for AKI. I ordered medication including IV fluids for hypotension and AKI Reevaluation of the patient after these medicines showed that the patient improved I have reviewed the patients home medicines and have made adjustments as needed   Social Determinants of Health:  Lives independently         Final Clinical Impression(s) / ED Diagnoses Final diagnoses:  AKI (acute kidney injury) [facility])    Rx / [place] Orders ED Discharge Orders     None         Godfrey Pick, MD 07/02/22 1752

## 2022-07-02 NOTE — Assessment & Plan Note (Addendum)
Last A1c month ago was 6.3.  CBGs on admission in the 200s. Unverified by patient but chart review show home medication include metformin and glipizide. -Carb modified diet -Holding home medication given recent overdose and AKI -CBG checks with Sensitive SSI

## 2022-07-02 NOTE — ED Triage Notes (Signed)
Pt BIB EMS due to an overdose. Pt has been having hand pain for 3 months. Pt is lethargic and possible dysarthric at baseline. Pt was work yesterday and around 2 am took 3 of "everything". Pt is hypotensive on arrival, abnormal EKG. Pt took potassium.    Meds Losartan, glipizide, celecoxib, potassium.

## 2022-07-02 NOTE — H&P (Cosign Needed Addendum)
Hospital Admission History and Physical Service Pager: [phone]  Patient name: Gina Richards Medical record number: [id number] Date of Birth: 03-09-1961 Age: 61 y.o. Gender: female  Primary Care Provider: Loura Back, NP Consultants: None  Code Status: Full Preferred Emergency Contact:  Contact Information     Name Relation Home Work Mobile   Bencosme,[place] Daughter [phone]          Chief Complaint: AKI   Assessment and Plan: Gina Richards is a 61 y.o. female presenting with generalized weakness and increased somnolence. Differential for this patient's presentation of this includes overdose on home medication.  (Most likely cause given report of overdose on home medications which include multiple sedating meds ) Seizure (less likely due to patient's presentation or viral illness) or viral illness (less likely given stable vitals, afebrile and no leukocytosis with lab)  * Overdose VVS. Other than somnolence and constricted pupils on exam, rest of exam was unremarkable. Somnolence likely secondary to overdose on sedated home medications.  Patient unable to confirm her home medication per chart review shows she is on Flexeril, Seroquel, gabapentin, amitriptyline, oxycodone etc. MRI was unremarkable. -Admit to Med Tele with FPTS, Dr. Deirdre Priest attending. -Fall precaution -Continue to monitor mental status -Follow up UDS, TSH -Holding home medication -Avoid sedative medication -Carb modified diet -Continuous Cardiac monitoring -Lovenox for VTE prophylaxis   Hypokalemia Hypokalemic on admission with K of 3.2. EKG show normal sinus with RBBB. Patient denies any chest pain or palpitations. -Replete with 40 MEQ of KCL -Monitor with AM BMP   Diabetes mellitus without complication (HCC) Last A1c month ago was 6.3.  CBGs on admission in the 200s. Unverified by patient but chart review show home medication include metformin and glipizide. -Carb modified diet -Holding  home medication given recent overdose and AKI -CBG checks with Sensitive SSI  AKI (acute kidney injury) (HCC) AKI on admission with creatinine of 1.30 and baseline is around 0.6. Likely prerenal in the setting of decreased PO intake and multiple nephrotoxic agent. Possible complication of poorly controlled T2DM.  Per chart review home medication include amitriptyline, gabapentin, metformin, oxycodone. -Monitor with daily BMP -Continue maintenance IV fluid -Avoid nephrotoxic agent, holding home medications.    FEN/GI: Carb modified diet VTE Prophylaxis: Levonox  Disposition: Med-Tele  History of Present Illness:  Gina Richards is a 61 y.o. female presenting with generalized weakness ans change in speech for the currently taking straight dose of her home medication.  Patient reports she got into an argument with her boyfriend yesterday after work and so took extra 3 pills of her home medications to help with sleep.  She denies any attempts to harm herself and endorses the extra pills was to help her sleep. All this occurred around 8pm yesterday.  In addition she reports having Bilateral numbness in her hands which has been going on for over 2 months now and yesterday she started having numbness on her left leg was was present prior to her taking extra dose of her home medications.   She denies any SI/H. No headache, chest pain, shortness of breath, vision changes, abdominal pain, lightheadedness.  Patient's history taking was very limited due to her somnolence. She was in and out of sleep during the  encounter   In the ED, patient's vitals were stable, EKG showed normal sinus rhythm with RBBB. MRI showed no acute findings and UDS is pending.  Review Of Systems: Per HPI with the following additions:  Pertinent Past Medical History:  Anxiety Bipolar disorder Depression T2DM HTN  Pertinent Past Surgical History:  Remainder reviewed in history tab.   Pertinent Social  History: Tobacco use: Yes (smokes 1-2 packs a day for over 30 years) Alcohol use: No Other Substance use: TSH in the past  Lives with boyfriend in apartment   Pertinent Family History: Unable to confirm with patient given limited history  Important Outpatient Medications: Remainder reviewed in medication history.   Objective: BP 114/71   Pulse 65   Temp 98.9 F (37.2 C)   Resp 16   SpO2 100%  General: somnolent easily arousable, NAD HEENT: dry mucous membrane, constricted pupil bilaterally but reactive to light  CV: RRR, no murmurs, normal S1/S2 Pulm: CTAB, good WOB on RA, no crackles or wheezing Abd: Soft, no distension, no tenderness Skin: dry, warm Ext: No BLE edema, +2 Pedal and radial pulse. Neuro: Oriented x3, No focal neurologic deficit  Labs:  CBC BMET  Recent Labs  Lab 07/02/22 1521  WBC 5.0  HGB 12.6  HCT 37.6  PLT 270   Recent Labs  Lab 07/02/22 1521  NA 139  K 3.2*  CL 104  CO2 26  BUN 18  CREATININE 1.30*  GLUCOSE 204*  CALCIUM 8.6*     EKG: My own interpretation Sinus Rhythm with RBBB   Imaging Studies Performed: Brain MRI Impression from Radiologist: No evidence of acute intracranial abnormality.   My Interpretation: No acute findings   Jerre Simon, MD 07/02/2022, 7:43 PM PGY-2, [facility]  FPTS Intern pager: [phone], text pages welcome Secure chat group [facility] Teaching Service

## 2022-07-02 NOTE — Progress Notes (Signed)
FMTS Interim Progress Note  S: Evaluated patient with Dr. Clayborne Artist. She is resting comfortably in the ED. She reports burning in her IV from the Potassium.   O: BP 110/82   Pulse 73   Temp 98.9 F (37.2 C)   Resp 15   SpO2 98%   Well appearing NAD   A/P: Potassium:  - BMP 3.2  - s/p 40 mEq IV  - recheck CMP in the morning - morning Neville Route, DO 07/02/2022, 10:46 PM PGY-1, [facility] Service pager [phone]

## 2022-07-02 NOTE — Assessment & Plan Note (Addendum)
AKI on admission with creatinine of 1.30 and baseline is around 0.6. Likely prerenal in the setting of decreased PO intake and multiple nephrotoxic agent. Possible complication of poorly controlled T2DM.  Per chart review home medication include amitriptyline, gabapentin, metformin, oxycodone. -Monitor with daily BMP -Continue maintenance IV fluid -Avoid nephrotoxic agent, holding home medications.

## 2022-07-02 NOTE — Assessment & Plan Note (Addendum)
Vital signs remain stable, though she is more hypertensive today, likely due to wearing off of anti-HTN meds. TSH normal, UDS pending. -Fall precaution -Continue to monitor mental status -Follow up UDS -Avoid sedative medication -Continuous Cardiac monitoring -F/u TOC consult for PCP and therapy resources

## 2022-07-02 NOTE — ED Notes (Signed)
Patient A&Ox4 no confusion noted. Patient given sandwich and water. No s/s of any distress. No verbal c/o pain or discomfort. Will continue to monitor

## 2022-07-02 NOTE — Assessment & Plan Note (Signed)
Hypokalemic on admission with K of 3.2. EKG show normal sinus with RBBB. Patient denies any chest pain or palpitations. -Replete with 40 MEQ of KCL -Monitor with AM BMP

## 2022-07-03 DIAGNOSIS — N179 Acute kidney failure, unspecified: Secondary | ICD-10-CM | POA: Diagnosis not present

## 2022-07-03 LAB — CBG MONITORING, ED
Glucose-Capillary: 76 mg/dL (ref 70–99)
Glucose-Capillary: 51 mg/dL — ABNORMAL LOW (ref 70–99)
Glucose-Capillary: 58 mg/dL — ABNORMAL LOW (ref 70–99)
Glucose-Capillary: 90 mg/dL (ref 70–99)

## 2022-07-03 LAB — CBC
HCT: 35.2 % — ABNORMAL LOW (ref 36.0–46.0)
Hemoglobin: 11.6 g/dL — ABNORMAL LOW (ref 12.0–15.0)
MCH: 30.9 pg (ref 26.0–34.0)
MCHC: 33 g/dL (ref 30.0–36.0)
MCV: 93.9 fL (ref 80.0–100.0)
Platelets: 239 10*3/uL (ref 150–400)
RBC: 3.75 MIL/uL — ABNORMAL LOW (ref 3.87–5.11)
RDW: 15.8 % — ABNORMAL HIGH (ref 11.5–15.5)
WBC: 4.6 10*3/uL (ref 4.0–10.5)
nRBC: 0 % (ref 0.0–0.2)

## 2022-07-03 LAB — MAGNESIUM: Magnesium: 1.8 mg/dL (ref 1.7–2.4)

## 2022-07-03 LAB — COMPREHENSIVE METABOLIC PANEL
ALT: 13 U/L (ref 0–44)
AST: 16 U/L (ref 15–41)
Albumin: 3.1 g/dL — ABNORMAL LOW (ref 3.5–5.0)
Alkaline Phosphatase: 66 U/L (ref 38–126)
Anion gap: 7 (ref 5–15)
BUN: 14 mg/dL (ref 8–23)
CO2: 26 mmol/L (ref 22–32)
Calcium: 8.1 mg/dL — ABNORMAL LOW (ref 8.9–10.3)
Chloride: 107 mmol/L (ref 98–111)
Creatinine, Ser: 0.72 mg/dL (ref 0.44–1.00)
GFR, Estimated: >60 mL/min (ref >60)
Glucose, Bld: 97 mg/dL (ref 70–99)
Potassium: 3 mmol/L — ABNORMAL LOW (ref 3.5–5.1)
Sodium: 140 mmol/L (ref 135–145)
Total Bilirubin: 0.2 mg/dL — ABNORMAL LOW (ref 0.3–1.2)
Total Protein: 5.8 g/dL — ABNORMAL LOW (ref 6.5–8.1)

## 2022-07-03 LAB — RAPID URINE DRUG SCREEN, HOSP PERFORMED
Amphetamines: NOT DETECTED
Barbiturates: NOT DETECTED
Benzodiazepines: NOT DETECTED
Cocaine: POSITIVE — AB
Opiates: NOT DETECTED
Tetrahydrocannabinol: NOT DETECTED

## 2022-07-03 MED ORDER — POTASSIUM CHLORIDE 20 MEQ PO PACK
60.0000 meq | PACK | Freq: Once | ORAL | Status: AC
  Administered 2022-07-03: 60 meq via ORAL
  Filled 2022-07-03: qty 3

## 2022-07-03 NOTE — Discharge Instructions (Signed)
Dear Gina Richards,  Thank you for letting us participate in your care. You were hospitalized for a decrease in kidney function (called an acute kidney injury) and diagnosed with Overdose. You were treated with fluids.   POST-HOSPITAL & CARE INSTRUCTIONS Take all of your medications as listed in this packet. Be sure to follow up with therapy resources. Follow up with a primary care physician to ensure you are getting the health care you need. Go to your follow up appointments (listed below)  DOCTOR'S APPOINTMENT   No future appointments.   Take care and be well!  Family Medicine Teaching Service Inpatient Team [place]  [facility]  [address] [place], [place] [address] [phone]

## 2022-07-03 NOTE — ED Notes (Signed)
Patient resting, no s/s of any distress. Bedside commode place in patient room. Patient able to follow verbal commands without any issues. Will continue to monitor

## 2022-07-03 NOTE — Evaluation (Signed)
Physical Therapy Evaluation Patient Details Name: Gina Richards MRN: [id number] DOB: [birth year] Today's Date: 07/03/2022  History of Present Illness  Pt is 61 yo female admitted on 07/02/22 with AKI and generalized weakness after overdose on home meds.  She has hx including anxiety, bipolar, depression, DM, HTN, CVA  Clinical Impression  Pt admitted with above diagnosis. At baseline, pt ambulatory without AD (most days).  She does occasionally use cane due to R hip pain.  She lives alone but could have intermittent assist if needed. Today, pt required min A for bed mobility, min guard to ambulate 100', and was able to step up 1 step.  She did use RW today and had some unsteadiness initially without RW.  Recommend HHPT and RW at d/c. Pt currently with functional limitations due to the deficits listed below (see PT Problem List). Pt will benefit from skilled PT to increase their independence and safety with mobility to allow discharge to the venue listed below.          Recommendations for follow up therapy are one component of a multi-disciplinary discharge planning process, led by the attending physician.  Recommendations may be updated based on patient status, additional functional criteria and insurance authorization.  Follow Up Recommendations Home health PT      Assistance Recommended at Discharge Intermittent Supervision/Assistance  Patient can return home with the following  A little help with walking and/or transfers;A little help with bathing/dressing/bathroom;Assistance with cooking/housework;Help with stairs or ramp for entrance    Equipment Recommendations Rolling walker (2 wheels)  Recommendations for Other Services       Functional Status Assessment Patient has had a recent decline in their functional status and demonstrates the ability to make significant improvements in function in a reasonable and predictable amount of time.     Precautions / Restrictions  Precautions Precautions: Fall      Mobility  Bed Mobility Overal bed mobility: Needs Assistance Bed Mobility: Supine to Sit, Sit to Supine     Supine to sit: Min assist Sit to supine: Supervision   General bed mobility comments: light min A to lift trunk    Transfers Overall transfer level: Needs assistance Equipment used: Rolling walker (2 wheels) Transfers: Sit to/from Stand Sit to Stand: Min assist, Min guard           General transfer comment: performed x 2, initially min A to steady progressed to min guard    Ambulation/Gait Ambulation/Gait assistance: Min guard Gait Distance (Feet): 100 Feet Assistive device: Rolling walker (2 wheels) Gait Pattern/deviations: Step-to pattern, Decreased stance time - right Gait velocity: decreased     General Gait Details: Pt with step to R , reports similar to baseline except using RW, reports weakness/pain R LE at times baseline  Stairs Stairs: Yes Stairs assistance: Min guard Stair Management: One rail Left Number of Stairs: 1    Wheelchair Mobility    Modified Rankin (Stroke Patients Only)       Balance Overall balance assessment: Needs assistance Sitting-balance support: No upper extremity supported       Standing balance support: No upper extremity supported, Bilateral upper extremity supported Standing balance-Leahy Scale: Fair Standing balance comment: could static stand without AD but RW to ambulate                             Pertinent Vitals/Pain Pain Assessment Pain Assessment: 0-10 Pain Location: both hands Pain Descriptors / Indicators: Tingling,  Tightness Pain Intervention(s): Limited activity within patient's tolerance, Monitored during session    Home Living Family/patient expects to be discharged to:: Private residence Living Arrangements: Alone Available Help at Discharge: Friend(s);Available PRN/intermittently Type of Home: Apartment Home Access: Level entry        Home Layout: One level Home Equipment: BSC/3in1;Cane - single point      Prior Function               Mobility Comments: could ambulate in community without AD; occasionally use cane ADLs Comments: independent with adls and IADLs; does not drive     Hand Dominance        Extremity/Trunk Assessment   Upper Extremity Assessment Upper Extremity Assessment: RUE deficits/detail;LUE deficits/detail RUE Deficits / Details: Weak grip and reports decreased sensation, tingling, tightness in all fingers and hand present since admission.  Shoulder elevation limited by functional.  Strength 5/5 throughout except grips. LUE Deficits / Details: Weak grip and reports decreased sensation, tingling, tightness in all fingers and hand present since admission. Shoulder elevation limited by functional. Strength 5/5 throughout except grips.    Lower Extremity Assessment Lower Extremity Assessment: Overall WFL for tasks assessed (ROM WFL, MMT 5/5, sensation intact)    Cervical / Trunk Assessment Cervical / Trunk Assessment: Normal  Communication   Communication: No difficulties  Cognition Arousal/Alertness: Awake/alert Behavior During Therapy: WFL for tasks assessed/performed Overall Cognitive Status: Within Functional Limits for tasks assessed                                          General Comments General comments (skin integrity, edema, etc.): VSS    Exercises     Assessment/Plan    PT Assessment Patient needs continued PT services  PT Problem List Decreased strength;Decreased knowledge of use of DME;Decreased activity tolerance;Decreased safety awareness;Decreased balance;Decreased knowledge of precautions;Decreased mobility;Impaired sensation       PT Treatment Interventions DME instruction;Balance training;Gait training;Neuromuscular re-education;Stair training;Functional mobility training;Patient/family education;Therapeutic activities;Therapeutic exercise     PT Goals (Current goals can be found in the Care Plan section)  Acute Rehab PT Goals Patient Stated Goal: return home PT Goal Formulation: With patient Time For Goal Achievement: 07/17/22 Potential to Achieve Goals: Good    Frequency Min 3X/week     Co-evaluation               AM-PAC PT "6 Clicks" Mobility  Outcome Measure Help needed turning from your back to your side while in a flat bed without using bedrails?: A Little Help needed moving from lying on your back to sitting on the side of a flat bed without using bedrails?: A Little Help needed moving to and from a bed to a chair (including a wheelchair)?: A Little Help needed standing up from a chair using your arms (e.g., wheelchair or bedside chair)?: A Little Help needed to walk in hospital room?: A Little Help needed climbing 3-5 steps with a railing? : A Little 6 Click Score: 18    End of Session Equipment Utilized During Treatment: Gait belt Activity Tolerance: Patient tolerated treatment well Patient left: in bed;with call bell/phone within reach (in ED) Nurse Communication: Mobility status PT Visit Diagnosis: Other abnormalities of gait and mobility (R26.89)    Time: 8032-1224 PT Time Calculation (min) (ACUTE ONLY): 24 min   Charges:   PT Evaluation $PT Eval Low Complexity: 1 Low PT Treatments $Gait  Training: 8-22 mins        Anise Salvo, PT Acute Rehab [facility] Rehab [phone]   Rayetta Humphrey 07/03/2022, 1:07 PM

## 2022-07-03 NOTE — Progress Notes (Signed)
TOC CSW and CM spoke with pt about transportation to Internal Medicine doctor and [facility].  Pt stated she does have transportation through her insurance and would be utilizing it for her transportation home. Pt did express concerns about getting to [facility] after 5pm.   CSW made pt aware of her appointment date and time as well.  Olan Kurek Tarpley-Carter, MSW, LCSW-A Pronouns:  She/Her/Hers [facility] HealthTransitions of Care Clinical Social Worker Direct Number:  [phone] [email or web address]

## 2022-07-03 NOTE — ED Notes (Signed)
Pt states she did not drink all the juice. Pt educated to drink all of the juice provided in order to get CBG up.

## 2022-07-03 NOTE — Progress Notes (Addendum)
     Daily Progress Note Intern Pager: [phone]  Patient name: Gina Richards Medical record number: [id number] Date of birth: 1961-07-31 Age: 61 y.o. Gender: female  Primary Care Provider: Loura Back, NP Consultants: None Code Status: Full  Pt Overview and Major Events to Date:  11/12 - admitted  Assessment and Plan:  Gina Richards is a 61 y.o. female who presented with AKI and generalized weakness and somnolence after overdose on home medications. Pertinent PMH/PSH includes anxiety, bipolar disorder, depression, T2DM, and HTN.   * Overdose Vital signs remain stable, though she is more hypertensive today, likely due to wearing off of anti-HTN meds. TSH normal, UDS pending. -Fall precaution -Continue to monitor mental status -Follow up UDS -Avoid sedative medication -Continuous Cardiac monitoring -F/u TOC consult for PCP and therapy resources -PT eval  Hypokalemia Hypokalemic on admission with K of 3.2, now 3 this morning. EKG show normal sinus with RBBB. Patient denies any chest pain or palpitations. -Replete with 60 MEQ of KCL -Monitor with AM BMP   Diabetes mellitus without complication (HCC) Last A1c month ago was 6.3.  CBGs on admission in the 200s. Recent CBG this morning 78. Unverified by patient but chart review show home medication include metformin and glipizide. -Carb modified diet -Holding home medication given recent overdose and AKI -CBG checks with Sensitive SSI  AKI (acute kidney injury) (HCC) AKI on admission with Cr 1.30, now **. Baseline is around 0.6. Likely prerenal in the setting of decreased PO intake and multiple nephrotoxic agents. Possible complication of poorly controlled T2DM.  Per chart review home medication include amitriptyline, gabapentin, metformin, oxycodone. -Monitor with daily BMP -Stop maintenance IV fluids -Avoid nephrotoxic agents, holding home medications.  FEN/GI: Carb modified diet PPx: Lovenox Dispo:Home pending clinical  improvement , either today or tomorrow  Subjective:  Doing well without concerns. She wants to go home but does not have a ride today.  Objective: Temp:  [97.9 F (36.6 C)-98.9 F (37.2 C)] 97.9 F (36.6 C) (11/13 0435) Pulse Rate:  [25-79] 68 (11/13 0700) Resp:  [13-19] 17 (11/13 0700) BP: (83-156)/(58-98) 142/93 (11/13 0700) SpO2:  [84 %-100 %] 98 % (11/13 0700) Physical Exam: General: Alert and oriented, in NAD Skin: Warm, dry HEENT: NCAT, EOM grossly normal, midline nasal septum Cardiac: RRR, no m/r/g appreciated Respiratory: CTAB anteriorly, breathing and speaking comfortably on RA Abdominal: Soft, nontender, nondistended, normoactive bowel sounds Extremities: Moves all extremities grossly equally in bed Neurological: No gross focal deficit Psychiatric: Appropriate mood and affect   Laboratory: Most recent CBC Lab Results  Component Value Date   WBC 5.0 07/02/2022   HGB 12.6 07/02/2022   HCT 37.6 07/02/2022   MCV 95.2 07/02/2022   PLT 270 07/02/2022   Most recent BMP    Latest Ref Rng & Units 07/02/2022    3:21 PM  BMP  Glucose 70 - 99 mg/dL 673   BUN 8 - 23 mg/dL 18   Creatinine 4.19 - 1.00 mg/dL 3.79   Sodium 024 - 097 mmol/L 139   Potassium 3.5 - 5.1 mmol/L 3.2   Chloride 98 - 111 mmol/L 104   CO2 22 - 32 mmol/L 26   Calcium 8.9 - 10.3 mg/dL 8.6    Gina Holmes, MD 07/03/2022, 7:19 AM PGY-1, [facility] FPTS Intern pager: [phone], text pages welcome Secure chat group [facility] Teaching Service

## 2022-07-03 NOTE — Discharge Summary (Signed)
[facility] Discharge Summary  Patient name: Gina Richards Medical record number: [id number] Date of birth: 06-03-1961 Age: 61 y.o. Gender: female Date of Admission: 07/02/2022  Date of Discharge: 07/03/2022 Admitting Physician: Lind Covert, MD  Primary Care Provider: Arthur Holms, NP Consultants: None  Indication for Hospitalization: AKI after overdose  Brief Hospital Course:  Gina Richards is a 61 y.o. female who was admitted due to an AKI in the setting of drug overdose. PMH significant for anxiety, bipolar depression, T2DM, HTN.  Overdose Patient presented with somnolence, weakness, and softer BP with concern for stroke initially. Patient admitted to taking extra doses of all home medications including amitriptyline, atorvastatin, gabapentin, glipizide, norco, hyzar, metformin, seroquel, zanaflex, and potassium. Poison control was not called, as ingestion had occurred 12 hours prior. After admission on fluids, her mental status and vitals resolved.  AKI Creatinine elevated to 1.3 on admission in the setting of drug overdose and decreased oral intake. Patient was given IVF with improvement of creatinine to 0.72 upon discharge.   Hypokalemia Hypokalemic on admission and repleted with appropriate potassium supplementation.  T2DM Home metformin and glipizide help upon admission given AKI and overdose including these medications. Patient was given sliding scale insulin for glucose control. Home glipizide was held on discharge. Instructed to follow up with PCP for adjustments.   Issues for follow-up: Repeat BMP to monitor creatinine and electrolyte levels Follow up diabetes control, home glipizide held. Reassess and adjust as appropriate.  Ensure outpatient follow up with therapy resources  Discharge Diagnoses/Problem List:  Principal Problem for Admission: AKI  Disposition: Home  Discharge Condition: medically stable   Discharge  Exam:  Blood pressure 115/87, pulse 85, temperature 98.2 F (36.8 C), temperature source Oral, resp. rate (!) 22, weight 71.9 kg, SpO2 100 %.  General: Alert and oriented, in NAD Skin: Warm, dry HEENT: NCAT, EOM grossly normal, midline nasal septum Cardiac: RRR, no m/r/g appreciated Respiratory: CTAB anteriorly, breathing and speaking comfortably on RA Abdominal: Soft, nontender, nondistended, normoactive bowel sounds Extremities: Moves all extremities grossly equally in bed Neurological: No gross focal deficit Psychiatric: Appropriate mood and affect   Significant Labs and Imaging:  Recent Labs  Lab 07/02/22 1521 07/03/22 0635  WBC 5.0 4.6  HGB 12.6 11.6*  HCT 37.6 35.2*  PLT 270 239   Recent Labs  Lab 07/02/22 1521 07/03/22 0635  NA 139 140  K 3.2* 3.0*  CL 104 107  CO2 26 26  GLUCOSE 204* 97  BUN 18 14  CREATININE 1.30* 0.72  CALCIUM 8.6* 8.1*  MG 2.0 1.8  ALKPHOS 71 66  AST 23 16  ALT 11 13  ALBUMIN 3.5 3.1*   MRI brain wo contrast IMPRESSION: No evidence of acute intracranial abnormality.  Discharge Medications:  Allergies as of 07/03/2022   No Known Allergies      Medication List     STOP taking these medications    Carestart COVID-19 Home Test Kit Generic drug: COVID-19 At Home Antigen Test   glipiZIDE 10 MG 24 hr tablet Commonly known as: GLUCOTROL XL   potassium chloride SA 20 MEQ tablet Commonly known as: KLOR-CON M       TAKE these medications    atorvastatin 20 MG tablet Commonly known as: LIPITOR Take 20 mg by mouth at bedtime.   cyclobenzaprine 10 MG tablet Commonly known as: FLEXERIL Take 1 tablet (10 mg total) by mouth 3 (three) times daily as needed for muscle spasms. DO NOT  DRIVE WHILE TAKING THIS MEDICATION   gabapentin 800 MG tablet Commonly known as: NEURONTIN Take 800 mg by mouth daily as needed (for pain).   losartan-hydrochlorothiazide 50-12.5 MG tablet Commonly known as: HYZAAR Take 1 tablet by mouth daily.    metFORMIN 500 MG tablet Commonly known as: GLUCOPHAGE Take 500 mg by mouth daily with breakfast.   QUEtiapine Fumarate 150 MG 24 hr tablet Commonly known as: SEROQUEL XR Take 150 mg by mouth at bedtime.               Durable Medical Equipment  (From admission, onward)           Start     Ordered   07/03/22 1310  For home use only DME Walker rolling  Once       Question Answer Comment  Walker: With 5 Inch Wheels   Patient needs a walker to treat with the following condition Gait abnormality      07/03/22 1310            Discharge Instructions: Please refer to Patient Instructions section of EMR for full details.  Patient was counseled important signs and symptoms that should prompt return to medical care, changes in medications, dietary instructions, activity restrictions, and follow up appointments.   Follow-Up Appointments:  Follow-up Information     Atway, Rayann N, DO. Go on 07/17/2022.   Specialty: Internal Medicine Why: Time 0915, Please arrive 15 min early to complete paperwork, ER Follow-up Contact information: [place] [address] [phone]         [facility]. Schedule an appointment as soon as possible for a visit.   Specialty: Urgent Care Contact information: [place] [place] [address], DO 07/03/2022, 4:53 PM PGY-3, [place]

## 2022-07-03 NOTE — ED Notes (Signed)
Pt CBG 51, pt A/O, provided pt with juice and graham crackers.

## 2022-07-17 ENCOUNTER — Ambulatory Visit: Payer: Medicaid Other | Admitting: Internal Medicine

## 2022-07-17 NOTE — Progress Notes (Deleted)
CC: new patient/establish care  HPI:  Gina Richards is a 61 y.o. female living with a history stated below and presents today to establish care with the [facility]. Please see problem based assessment and plan for additional details.  PMHx: anxiety, bipolar 1, depression, type 2 diabetes, hypertension, polysubstance use disorder  Meds: amitriptyline ***, atorvastatin 20 mg, gabapentin 800 mg, glipizide ***, norco ***, losartan-hctz 50-12.5 mg, metformin 500 mg, seroquel 150 mg  All: NKDA  Fam Hx:  Soc Hx:  Tobacco: 1 ppd for ***  Past Medical History:  Diagnosis Date   Anxiety    Bipolar 1 disorder (HCC)    Depression    Diabetes mellitus without complication (HCC)    Hypertension    Pneumonia    Stroke [facility])     Current Outpatient Medications on File Prior to Visit  Medication Sig Dispense Refill   atorvastatin (LIPITOR) 20 MG tablet Take 20 mg by mouth at bedtime. (Patient not taking: Reported on 07/02/2022)     cyclobenzaprine (FLEXERIL) 10 MG tablet Take 1 tablet (10 mg total) by mouth 3 (three) times daily as needed for muscle spasms. DO NOT DRIVE WHILE TAKING THIS MEDICATION 30 tablet 0   gabapentin (NEURONTIN) 800 MG tablet Take 800 mg by mouth daily as needed (for pain).     losartan-hydrochlorothiazide (HYZAAR) 50-12.5 MG tablet Take 1 tablet by mouth daily.     metFORMIN (GLUCOPHAGE) 500 MG tablet Take 500 mg by mouth daily with breakfast.     QUEtiapine Fumarate (SEROQUEL XR) 150 MG 24 hr tablet Take 150 mg by mouth at bedtime.     [DISCONTINUED] escitalopram (LEXAPRO) 20 MG tablet Take 1 tablet (20 mg total) by mouth daily. 30 tablet 2   [DISCONTINUED] promethazine (PHENERGAN) 25 MG tablet Take 1 tablet (25 mg total) by mouth every 8 (eight) hours as needed for nausea or vomiting. (Patient not taking: Reported on 05/10/2015) 15 tablet 0   [DISCONTINUED] traZODone (DESYREL) 50 MG tablet Take 50 mg by mouth at bedtime.  3   No current facility-administered  medications on file prior to visit.    Family History  Family history unknown: Yes    Social History   Socioeconomic History   Marital status: Single    Spouse name: Not on file   Number of children: 4   Years of education: Not on file   Highest education level: Not on file  Occupational History   Not on file  Tobacco Use   Smoking status: Every Day    Packs/day: 1.00    Types: Cigarettes   Smokeless tobacco: Never  Vaping Use   Vaping Use: Never used  Substance and Sexual Activity   Alcohol use: Yes    Comment: One beer but she will not inform how often.    Drug use: No   Sexual activity: Not on file  Other Topics Concern   Not on file  Social History Narrative   Right Handed    Lives in a two story home   Social Determinants of Health   Financial Resource Strain: Not on file  Food Insecurity: Not on file  Transportation Needs: Not on file  Physical Activity: Not on file  Stress: Not on file  Social Connections: Not on file  Intimate Partner Violence: Not on file    Review of Systems: ROS negative except for what is noted on the assessment and plan.  There were no vitals filed for this visit.  Physical Exam: Constitutional:  well-appearing *** sitting in ***, in no acute distress HENT: normocephalic atraumatic, mucous membranes moist Eyes: conjunctiva non-erythematous Cardiovascular: regular rate and rhythm, no m/r/g Pulmonary/Chest: normal work of breathing on room air, lungs clear to auscultation bilaterally Abdominal: soft, non-tender, non-distended MSK: normal bulk and tone Neurological: alert & oriented x 3, no focal deficit Skin: warm and dry Psych: normal mood and behavior  Assessment & Plan:     Patient {GC/GE:[id number]::"discussed with","seen with"} Dr. {RFXJO:[id number]::"YMEBRAXE","N. Hoffman","Mullen","Narendra","Vincent","Guilloud","Lau","Machen"}  No problem-specific Assessment & Plan notes found for this encounter.   Gina Richards,  D.O. [facility] Internal Medicine, PGY-2 Phone: [phone] Date 07/17/2022 Time 8:08 AM

## 2022-07-22 IMAGING — CT CT MAXILLOFACIAL W/O CM
3 series · 14 of 47 positions shown, 16 images · non-contrast
Comparison: 03/14/2017

CLINICAL DATA: Recent fall with headaches and facial pain, initial
encounter

EXAM:
CT HEAD WITHOUT CONTRAST
CT MAXILLOFACIAL WITHOUT CONTRAST
CT CERVICAL SPINE WITHOUT CONTRAST
TECHNIQUE: Multidetector CT imaging of the head, cervical spine, and
maxillofacial structures were performed using the standard protocol
without intravenous contrast. Multiplanar CT image reconstructions
of the cervical spine and maxillofacial structures were also
generated.

[Series 3: facialbone 2.0 st · axial · 0.34mm/px · z∈[-169,-13]mm · 8 of 92 slices shown, 10 images]
[im 7/92  brain]
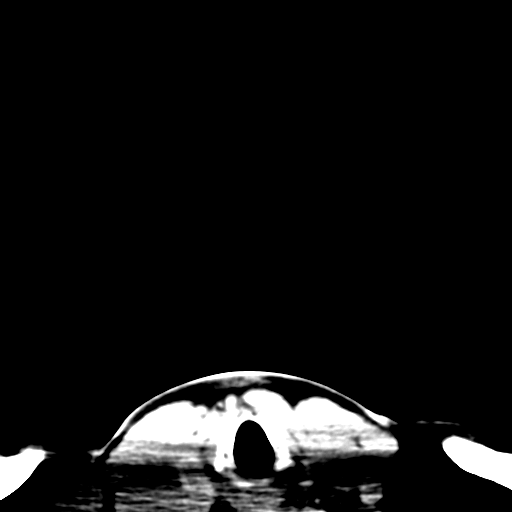
[im 7/92  bone]
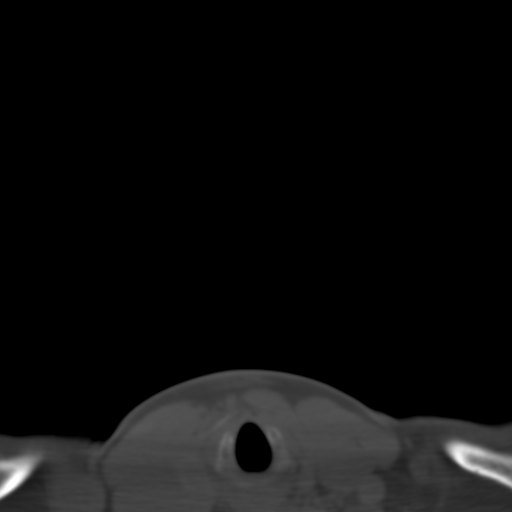
[im 19/92  bone]
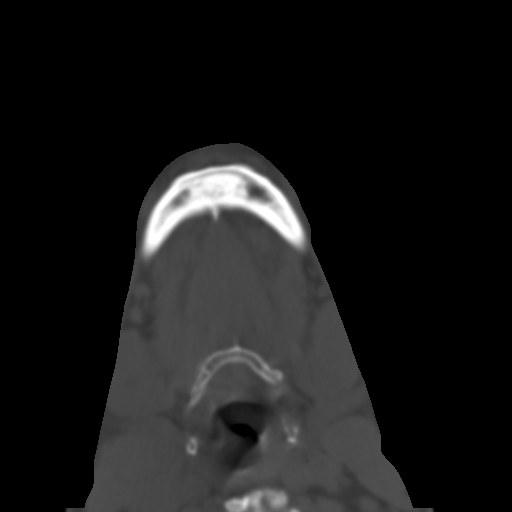
[im 29/92  bone]
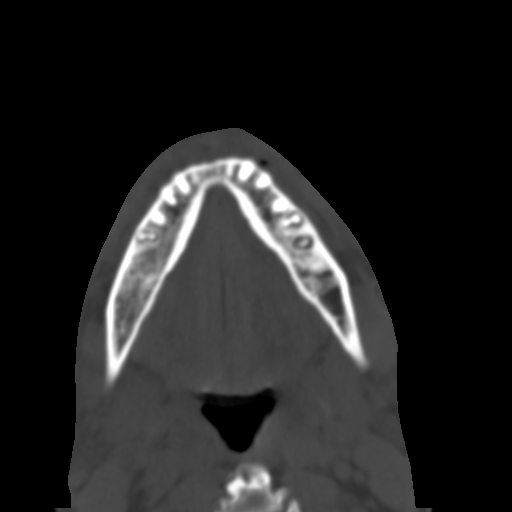
[im 41/92  bone]
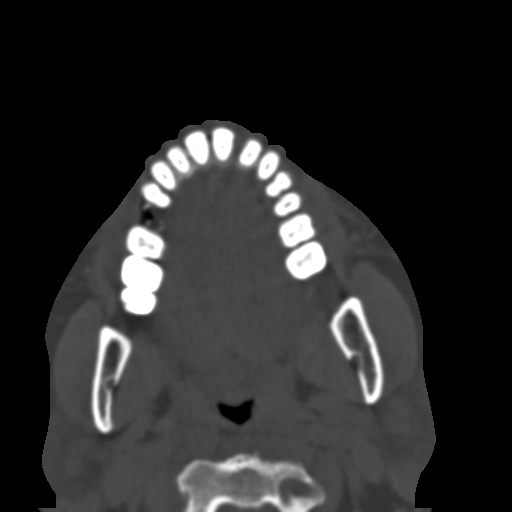
[im 51/92  brain]
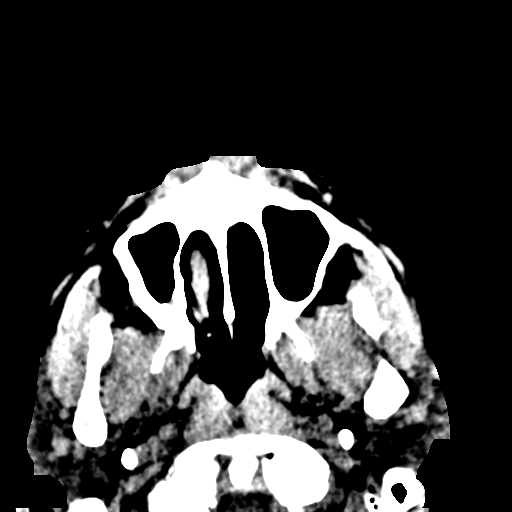
[im 51/92  bone]
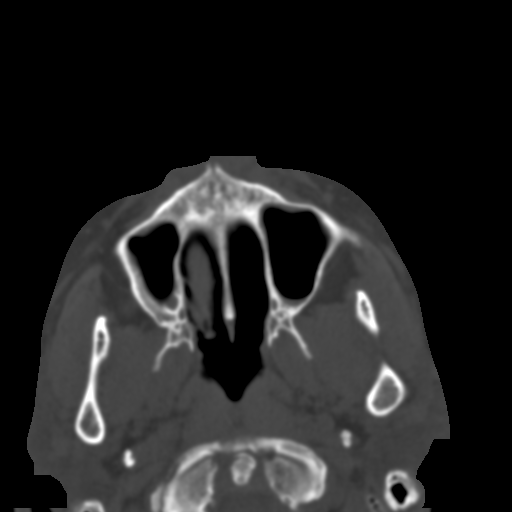
[im 63/92  bone]
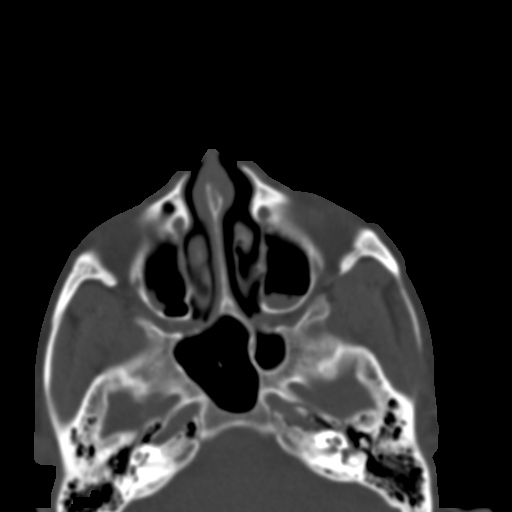
[im 73/92  bone]
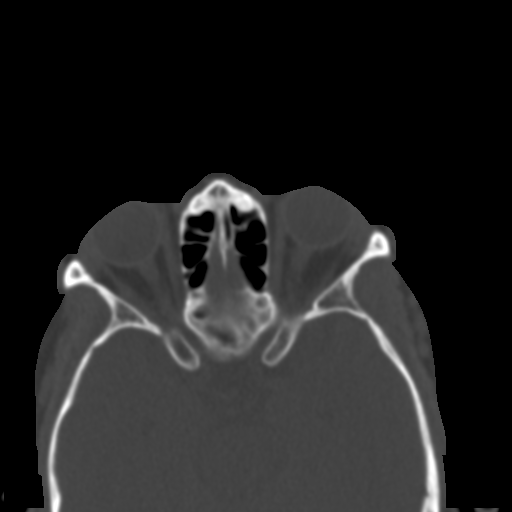
[im 85/92  bone]
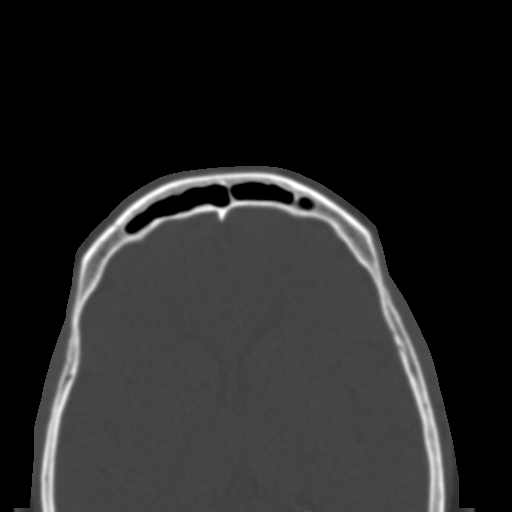

[Series 9: facialbone 2.0 cor st · coronal · 0.39mm/px · 3 of 92 slices shown]
[im 31/92  bone]
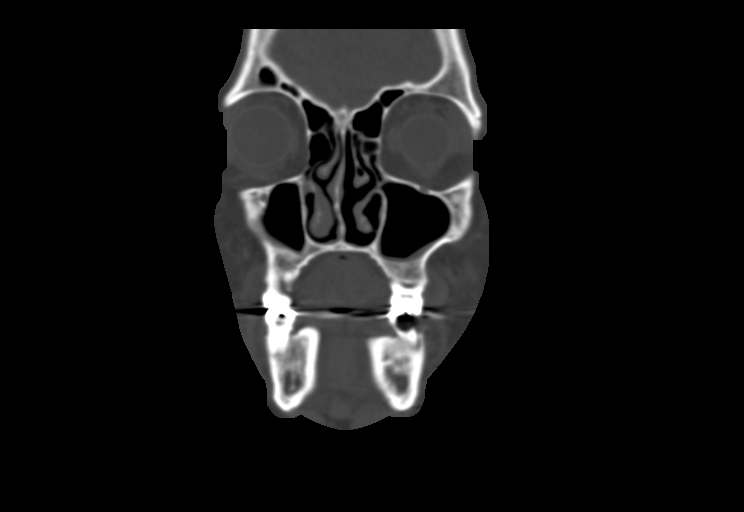
[im 41/92  bone]
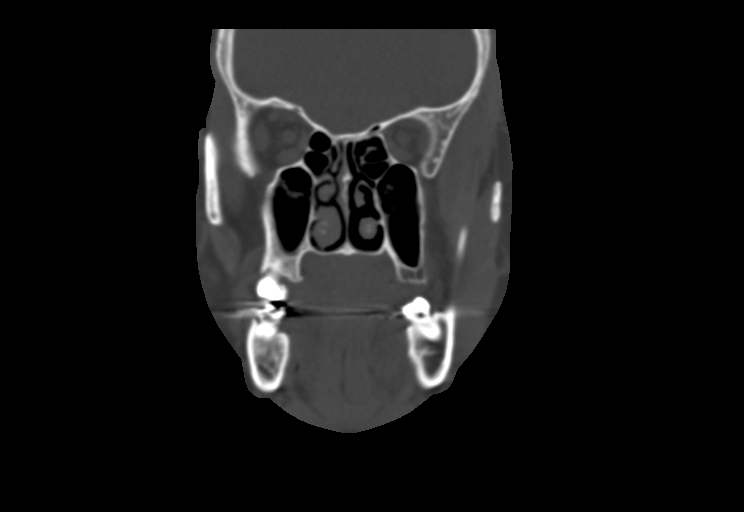
[im 51/92  bone]
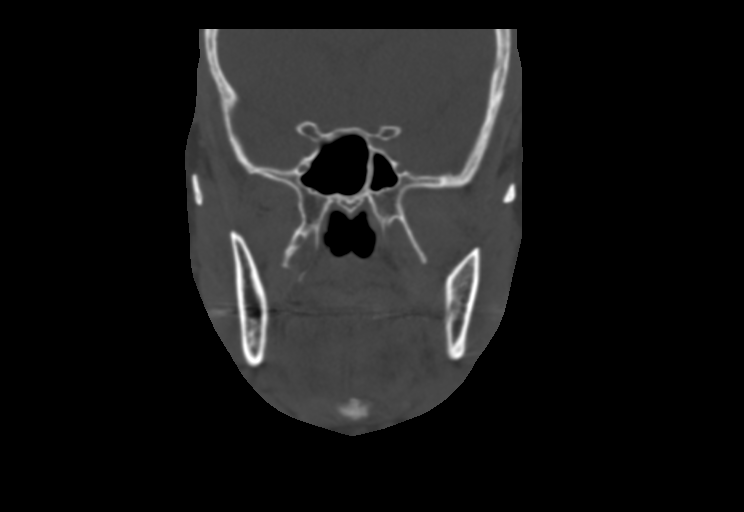

[Series 10: facialbone 2.0 sag st · sagittal · 0.38mm/px · 3 of 91 slices shown]
[im 31/91  bone]
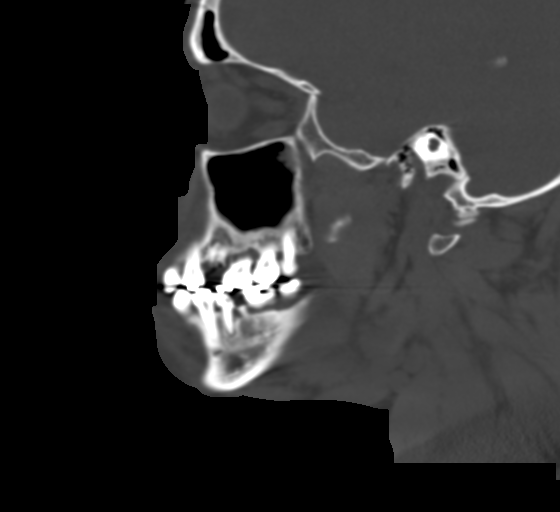
[im 46/91  bone]
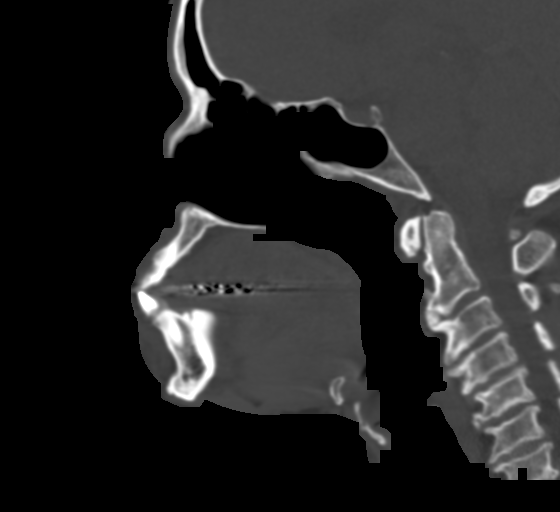
[im 61/91  bone]
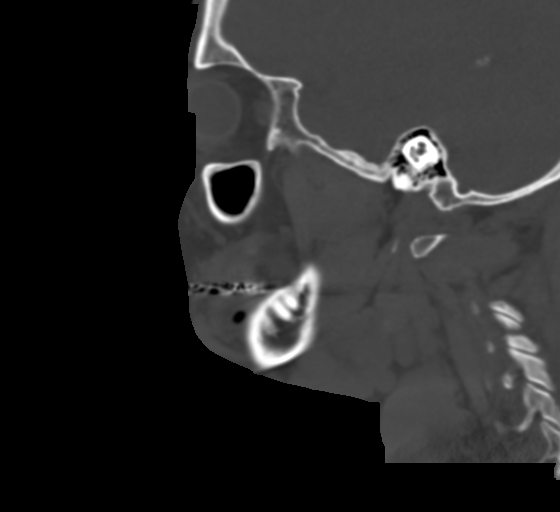

[14 of 47 positions shown; findings below may reference images not displayed]

FINDINGS: CT HEAD FINDINGS

Brain: No evidence of acute infarction, hemorrhage, hydrocephalus,
extra-axial collection or mass lesion/mass effect.

Vascular: No hyperdense vessel or unexpected calcification.

Skull: Normal. Negative for fracture or focal lesion.

Other: Sinuses are well aerated. Minimal air-fluid level is noted
within the left maxillary antrum.

CT MAXILLOFACIAL FINDINGS

Osseous: No acute fracture or dislocation is noted. Significant
degenerative changes of the left temporomandibular joint are seen.
No acute fracture is noted.

Orbits: Orbits and their contents are within normal limits.

Sinuses: Paranasal sinuses are well aerated. Minimal mucosal changes
are noted within the maxillary antra bilaterally.

Soft tissues: Surrounding soft tissue structures are within normal
limits.

CT CERVICAL SPINE FINDINGS

Alignment: Loss of normal cervical lordosis is noted.

Skull base and vertebrae: 7 cervical segments are well visualized.
Vertebral body height is well maintained. Osteophytic changes are
noted from C2 to T1 and extending into the thoracic spine. No acute
fracture or acute facet abnormality is noted. Mild facet
hypertrophic changes are seen.

Soft tissues and spinal canal: Surrounding soft tissue structures
are within normal limits. No acute abnormality seen.

Upper chest: Visualized lung apices are within normal limits.

Other: None
IMPRESSION: CT of the head: No acute intracranial abnormality noted.

CT of the maxillofacial bones. No acute bony abnormality is seen.
Chronic degenerative changes of left TMJ are seen.

Mild mucosal changes in the maxillary antra bilaterally.

CT of the cervical spine: Multilevel degenerative change without
acute abnormality.

## 2022-08-16 ENCOUNTER — Encounter (HOSPITAL_COMMUNITY): Payer: Self-pay

## 2022-08-16 ENCOUNTER — Ambulatory Visit (HOSPITAL_COMMUNITY)
Admission: EM | Admit: 2022-08-16 | Discharge: 2022-08-16 | Disposition: A | Discharge status: Home / Self Care | Payer: Medicare Other | Attending: Internal Medicine | Admitting: Internal Medicine

## 2022-08-16 DIAGNOSIS — R110 Nausea: Secondary | ICD-10-CM | POA: Diagnosis present

## 2022-08-16 DIAGNOSIS — Z1152 Encounter for screening for COVID-19: Secondary | ICD-10-CM | POA: Insufficient documentation

## 2022-08-16 DIAGNOSIS — J069 Acute upper respiratory infection, unspecified: Secondary | ICD-10-CM

## 2022-08-16 DIAGNOSIS — R059 Cough, unspecified: Secondary | ICD-10-CM | POA: Diagnosis not present

## 2022-08-16 MED ORDER — GUAIFENESIN ER 1200 MG PO TB12: 1200.0000 mg | ORAL_TABLET | Freq: Two times a day (BID) | ORAL | 0 refills | Status: AC

## 2022-08-16 MED ORDER — ONDANSETRON 4 MG PO TBDP
ORAL_TABLET | ORAL | Status: AC
  Filled 2022-08-16: qty 1

## 2022-08-16 MED ORDER — ONDANSETRON 4 MG PO TBDP: 4.0000 mg | ORAL_TABLET | Freq: Three times a day (TID) | ORAL | 0 refills | Status: AC | PRN

## 2022-08-16 MED ORDER — ACETAMINOPHEN 325 MG PO TABS
975.0000 mg | ORAL_TABLET | Freq: Once | ORAL | Status: AC
  Administered 2022-08-16: 975 mg via ORAL

## 2022-08-16 MED ORDER — ONDANSETRON 4 MG PO TBDP
4.0000 mg | ORAL_TABLET | Freq: Once | ORAL | Status: AC
  Administered 2022-08-16: 4 mg via ORAL

## 2022-08-16 MED ORDER — ACETAMINOPHEN 325 MG PO TABS
ORAL_TABLET | ORAL | Status: AC
  Filled 2022-08-16: qty 3

## 2022-08-16 MED ORDER — BENZONATATE 100 MG PO CAPS: 100.0000 mg | ORAL_CAPSULE | Freq: Three times a day (TID) | ORAL | 0 refills | Status: AC

## 2022-08-16 NOTE — ED Provider Notes (Signed)
[facility]    CSN: [id number] Arrival date & time: 08/16/22  1742      History   Chief Complaint No chief complaint on file.   HPI Gina Richards is a 61 y.o. female.   Patient presents urgent care for evaluation of cough, nasal congestion, nausea without vomiting, chills, body aches, and slight sore throat for the last 2 to 3 days.  She denies known sick contacts but works at [employer] caf[employer] and frequently is around people without masks on.  Denies known fever at home due to lack of thermometer.  Denies headache, chest pain, shortness of breath, heart palpitations, vomiting, diarrhea, abdominal pain, urinary symptoms, dizziness, and lightheadedness.  She is a diabetic and has a history of cocaine and marijuana abuse.  Denies history of chronic respiratory problems.  She is a current every day cigarette smoker.  She has not attempted use of any over-the-counter medicines prior to arrival urgent care to help with her symptoms.     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      Home Medications    Prior to  Admission medications   Medication Sig Start Date End Date Taking? Authorizing Provider  benzonatate (TESSALON) 100 MG capsule Take 1 capsule (100 mg total) by mouth every 8 (eight) hours. 08/16/22  Yes Carlisle BeersStanhope, Mitchell Iwanicki M, FNP  Guaifenesin 1200 MG TB12 Take 1 tablet (1,200 mg total) by mouth in the morning and at bedtime. 08/16/22  Yes Carlisle BeersStanhope, Jacelyn Cuen M, FNP  ondansetron (ZOFRAN-ODT) 4 MG disintegrating tablet Take 1 tablet (4 mg total) by mouth every 8 (eight) hours as needed for nausea or vomiting. 08/16/22  Yes Carlisle BeersStanhope, Aliveah Gallant M, FNP  atorvastatin (LIPITOR) 20 MG tablet Take 20 mg by mouth at bedtime. Patient not taking: Reported on 07/02/2022 12/01/21   [provider]  cyclobenzaprine (FLEXERIL) 10 MG tablet Take 1 tablet (10 mg total) by mouth 3 (three) times daily as needed for muscle spasms. DO NOT DRIVE WHILE TAKING THIS MEDICATION 06/14/20   Merrilee JanskyLamptey, Philip O, MD  gabapentin (NEURONTIN) 800 MG tablet Take 800 mg by mouth daily as needed (for pain). 12/01/21   [provider]  losartan-hydrochlorothiazide (HYZAAR) 50-12.5 MG tablet Take 1 tablet by mouth daily. 03/14/21   [provider]  metFORMIN (GLUCOPHAGE) 500 MG tablet Take 500 mg by mouth daily with breakfast. 04/07/21   [provider]  QUEtiapine Fumarate (SEROQUEL XR) 150 MG 24 hr tablet Take 150 mg by mouth at bedtime. 05/09/21   [provider]  escitalopram (LEXAPRO) 20 MG tablet Take 1  tablet (20 mg total) by mouth daily. 04/19/19 01/10/20  Mardella Layman, MD  promethazine (PHENERGAN) 25 MG tablet Take 1 tablet (25 mg total) by mouth every 8 (eight) hours as needed for nausea or vomiting. Patient not taking: Reported on 05/10/2015 11/10/14 06/15/15  Charlestine Night, PA-C  traZODone (DESYREL) 50 MG tablet Take 50 mg by mouth at bedtime. 03/13/17 04/19/19  [provider]    Family History Family History  Family history unknown: Yes    Social History Social History    Tobacco Use   Smoking status: Every Day    Packs/day: 1.00    Types: Cigarettes   Smokeless tobacco: Never  Vaping Use   Vaping Use: Never used  Substance Use Topics   Alcohol use: Yes    Comment: One beer but she will not inform how often.    Drug use: No     Allergies   Patient has no known allergies.   Review of Systems Review of Systems Per HPI  Physical Exam Triage Vital Signs ED Triage Vitals  Enc Vitals Group     BP 08/16/22 1920 128/77     Pulse Rate 08/16/22 1919 77     Resp 08/16/22 1919 17     Temp 08/16/22 1919 98.4 F (36.9 C)     Temp Source 08/16/22 1919 Oral     SpO2 08/16/22 1919 98 %     Weight --      Height --      Head Circumference --      Peak Flow --      Pain Score 08/16/22 1918 4     Pain Loc --      Pain Edu? --      Excl. in GC? --    No data found.  Updated Vital Signs BP 128/77   Pulse 77   Temp 98.4 F (36.9 C) (Oral)   Resp 17   SpO2 98%   Visual Acuity Right Eye Distance:   Left Eye Distance:   Bilateral Distance:    Right Eye Near:   Left Eye Near:    Bilateral Near:     Physical Exam Vitals and nursing note reviewed.  Constitutional:      Appearance: She is ill-appearing. She is not toxic-appearing.  HENT:     Head: Normocephalic and atraumatic.     Right Ear: Hearing, tympanic membrane, ear canal and external ear normal.     Left Ear: Hearing, tympanic membrane, ear canal and external ear normal.     Nose: Congestion present.     Mouth/Throat:     Lips: Pink.     Mouth: Mucous membranes are moist.     Pharynx: No posterior oropharyngeal erythema.  Eyes:     General: Lids are normal. Vision grossly intact. Gaze aligned appropriately.        Right eye: No discharge.        Left eye: No discharge.     Extraocular Movements: Extraocular movements intact.     Conjunctiva/sclera: Conjunctivae normal.  Cardiovascular:     Rate and Rhythm: Normal rate and regular rhythm.     Heart sounds: Normal heart  sounds, S1 normal and S2 normal.  Pulmonary:     Effort: Pulmonary effort is normal. No respiratory distress.     Breath sounds: Normal breath sounds and air entry.  Abdominal:     General: Bowel sounds are normal.     Palpations: Abdomen is soft.  Tenderness: There is no abdominal tenderness. There is no right CVA tenderness, left CVA tenderness or guarding.  Musculoskeletal:     Cervical back: Neck supple.     Right lower leg: No edema.     Left lower leg: No edema.  Lymphadenopathy:     Cervical: No cervical adenopathy.  Skin:    General: Skin is warm and dry.     Capillary Refill: Capillary refill takes less than 2 seconds.     Findings: No rash.  Neurological:     General: No focal deficit present.     Mental Status: She is alert and oriented to person, place, and time. Mental status is at baseline.     Cranial Nerves: No dysarthria or facial asymmetry.  Psychiatric:        Mood and Affect: Mood normal.        Speech: Speech normal.        Behavior: Behavior normal.        Thought Content: Thought content normal.        Judgment: Judgment normal.      UC Treatments / Results  Labs (all labs ordered are listed, but only abnormal results are displayed) Labs Reviewed  SARS CORONAVIRUS 2 (TAT 6-24 HRS)    EKG   Radiology No results found.  Procedures Procedures (including critical care time)  Medications Ordered in UC Medications  acetaminophen (TYLENOL) tablet 975 mg (has no administration in time range)  ondansetron (ZOFRAN-ODT) disintegrating tablet 4 mg (has no administration in time range)    Initial Impression / Assessment and Plan / UC Course  I have reviewed the triage vital signs and the nursing notes.  Pertinent labs & imaging results that were available during my care of the patient were reviewed by me and considered in my medical decision making (see chart for details).   Viral URI with cough Symptoms and physical exam consistent with a  viral upper respiratory tract infection that will likely resolve with rest, fluids, and prescriptions for symptomatic relief. No indication for imaging today based on stable cardiopulmonary exam and hemodynamically stable vital signs.  COVID-19 testing is pending.  We will call patient if this is positive.  Quarantine guidelines discussed. Currently on day 3 of symptoms and does qualify for antiviral therapy.  If she test positive for COVID-19, she may have molnupiravir.  Patient is currently nontoxic in appearance and does not appear to be clinically dehydrated. Patient given Tylenol 1000 mg and Zofran 4 mg ODT in clinic today for nausea and bodyaches. Guaifenesin, Tessalon Perles, and Zofran sent to pharmacy for symptomatic relief to be taken as prescribed.  She is not a candidate for NSAIDs as she was recently admitted to the hospital with an acute kidney injury.  May use Tylenol 1000 mg every 6 hours as needed for aches and pains as well as fever/chills.  Strict ED/urgent care return precautions given.  Patient verbalizes understanding and agreement with plan.  Counseled patient regarding possible side effects and uses of all medications prescribed at today's visit.  Patient verbalizes understanding and agreement with plan.  All questions answered.  Patient discharged from urgent care in stable condition.       Final Clinical Impressions(s) / UC Diagnoses   Final diagnoses:  Viral URI with cough  Nausea without vomiting     Discharge Instructions      You have a viral upper respiratory infection.  COVID-19 testing is pending. We will call you with results if  positive. If your COVID test is positive, you must stay at home until day 6 of symptoms. On day 6, you may go out into public and go back to work, but you must wear a mask until day 11 of symptoms to prevent spread to others.  Purchase mucinex (guaifenesin) 1200mg  and take this every 12 hours for the next few days to thin your nasal  congestion and mucous so that you are able to get out of your body easier by coughing and blowing your nose. Drink plenty of water while taking this.  Take tessalon pearles every 8 hours as needed for cough.  You may take tylenol 1,000mg  every 6 hours as needed for fever, chills, and aches/pains.  Zofran 4mg  ODT every 8 hours as needed for nausea and vomiting.  You may do salt water and baking soda gargles every 4 hours as needed for your throat pain.  Please put 1 teaspoon of salt and 1/2 teaspoon of baking soda in 8 ounces of warm water then gargle and spit the water out. You may also put 1 tablespoon of honey in warm water and drink this to soothe your throat.  Place a humidifier in your room at night to help decrease dry air that can irritate your airway and cause you to have a sore throat and cough.  Please try to eat a well-balanced diet while you are sick so that your body gets proper nutrition to heal.  If you develop any new or worsening symptoms, please return.  If your symptoms are severe, please go to the emergency room.  Follow-up with your primary care provider for further evaluation and management of your symptoms as well as ongoing wellness visits.  I hope you feel better!     ED Prescriptions     Medication Sig Dispense Auth. Provider   Guaifenesin 1200 MG TB12 Take 1 tablet (1,200 mg total) by mouth in the morning and at bedtime. 14 tablet M, FNP   benzonatate (TESSALON) 100 MG capsule Take 1 capsule (100 mg total) by mouth every 8 (eight) hours. 21 capsule Reita May M, FNP   ondansetron (ZOFRAN-ODT) 4 MG disintegrating tablet Take 1 tablet (4 mg total) by mouth every 8 (eight) hours as needed for nausea or vomiting. 20 tablet Reita May, FNP      PDMP not reviewed this encounter.   M, Carlisle Beers 08/16/22 2025

## 2022-08-16 NOTE — ED Triage Notes (Signed)
Pt reports vomiting X 2 days. Sates her head and back hurt.

## 2022-08-16 NOTE — Discharge Instructions (Signed)
You have a viral upper respiratory infection.  COVID-19 testing is pending. We will call you with results if positive. If your COVID test is positive, you must stay at home until day 6 of symptoms. On day 6, you may go out into public and go back to work, but you must wear a mask until day 11 of symptoms to prevent spread to others.  Purchase mucinex (guaifenesin) 1200mg  and take this every 12 hours for the next few days to thin your nasal congestion and mucous so that you are able to get out of your body easier by coughing and blowing your nose. Drink plenty of water while taking this.  Take tessalon pearles every 8 hours as needed for cough.  You may take tylenol 1,000mg  every 6 hours as needed for fever, chills, and aches/pains.  Zofran 4mg  ODT every 8 hours as needed for nausea and vomiting.  You may do salt water and baking soda gargles every 4 hours as needed for your throat pain.  Please put 1 teaspoon of salt and 1/2 teaspoon of baking soda in 8 ounces of warm water then gargle and spit the water out. You may also put 1 tablespoon of honey in warm water and drink this to soothe your throat.  Place a humidifier in your room at night to help decrease dry air that can irritate your airway and cause you to have a sore throat and cough.  Please try to eat a well-balanced diet while you are sick so that your body gets proper nutrition to heal.  If you develop any new or worsening symptoms, please return.  If your symptoms are severe, please go to the emergency room.  Follow-up with your primary care provider for further evaluation and management of your symptoms as well as ongoing wellness visits.  I hope you feel better!

## 2022-08-17 LAB — SARS CORONAVIRUS 2 (TAT 6-24 HRS): SARS Coronavirus 2: NEGATIVE

## 2022-12-24 ENCOUNTER — Encounter (HOSPITAL_COMMUNITY): Payer: Self-pay

## 2022-12-24 ENCOUNTER — Emergency Department (HOSPITAL_COMMUNITY): Payer: 59

## 2022-12-24 ENCOUNTER — Emergency Department (HOSPITAL_COMMUNITY)
Admission: EM | Admit: 2022-12-24 | Discharge: 2022-12-24 | Disposition: A | Discharge status: Home / Self Care | Payer: 59 | Attending: Student | Admitting: Student

## 2022-12-24 ENCOUNTER — Other Ambulatory Visit: Payer: Self-pay

## 2022-12-24 DIAGNOSIS — I119 Hypertensive heart disease without heart failure: Secondary | ICD-10-CM | POA: Diagnosis not present

## 2022-12-24 DIAGNOSIS — Z79899 Other long term (current) drug therapy: Secondary | ICD-10-CM | POA: Insufficient documentation

## 2022-12-24 DIAGNOSIS — F17210 Nicotine dependence, cigarettes, uncomplicated: Secondary | ICD-10-CM | POA: Insufficient documentation

## 2022-12-24 DIAGNOSIS — R55 Syncope and collapse: Secondary | ICD-10-CM | POA: Diagnosis not present

## 2022-12-24 DIAGNOSIS — E876 Hypokalemia: Secondary | ICD-10-CM | POA: Insufficient documentation

## 2022-12-24 DIAGNOSIS — Z7984 Long term (current) use of oral hypoglycemic drugs: Secondary | ICD-10-CM | POA: Insufficient documentation

## 2022-12-24 DIAGNOSIS — R109 Unspecified abdominal pain: Secondary | ICD-10-CM | POA: Insufficient documentation

## 2022-12-24 DIAGNOSIS — I959 Hypotension, unspecified: Secondary | ICD-10-CM | POA: Insufficient documentation

## 2022-12-24 DIAGNOSIS — E119 Type 2 diabetes mellitus without complications: Secondary | ICD-10-CM | POA: Diagnosis not present

## 2022-12-24 DIAGNOSIS — F1290 Cannabis use, unspecified, uncomplicated: Secondary | ICD-10-CM | POA: Diagnosis not present

## 2022-12-24 DIAGNOSIS — I10 Essential (primary) hypertension: Secondary | ICD-10-CM | POA: Insufficient documentation

## 2022-12-24 LAB — CBC WITH DIFFERENTIAL/PLATELET
Abs Immature Granulocytes: 0.01 10*3/uL (ref 0.00–0.07)
Basophils Absolute: 0 10*3/uL (ref 0.0–0.1)
Basophils Relative: 0 %
Eosinophils Absolute: 0.1 10*3/uL (ref 0.0–0.5)
Eosinophils Relative: 2 %
HCT: 42.2 % (ref 36.0–46.0)
Hemoglobin: 13.7 g/dL (ref 12.0–15.0)
Immature Granulocytes: 0 %
Lymphocytes Relative: 37 %
Lymphs Abs: 2 10*3/uL (ref 0.7–4.0)
MCH: 31.1 pg (ref 26.0–34.0)
MCHC: 32.5 g/dL (ref 30.0–36.0)
MCV: 95.9 fL (ref 80.0–100.0)
Monocytes Absolute: 0.8 10*3/uL (ref 0.1–1.0)
Monocytes Relative: 14 %
Neutro Abs: 2.5 10*3/uL (ref 1.7–7.7)
Neutrophils Relative %: 47 %
Platelets: 301 10*3/uL (ref 150–400)
RBC: 4.4 MIL/uL (ref 3.87–5.11)
RDW: 14.3 % (ref 11.5–15.5)
WBC: 5.5 10*3/uL (ref 4.0–10.5)
nRBC: 0 % (ref 0.0–0.2)

## 2022-12-24 LAB — URINALYSIS, ROUTINE W REFLEX MICROSCOPIC
Bilirubin Urine: NEGATIVE
Glucose, UA: NEGATIVE mg/dL
Hgb urine dipstick: NEGATIVE
Ketones, ur: NEGATIVE mg/dL
Leukocytes,Ua: NEGATIVE
Nitrite: NEGATIVE
Protein, ur: NEGATIVE mg/dL
Specific Gravity, Urine: >1.046 — ABNORMAL HIGH (ref 1.005–1.030)
pH: 6 (ref 5.0–8.0)

## 2022-12-24 LAB — BASIC METABOLIC PANEL
Anion gap: 10 (ref 5–15)
BUN: 18 mg/dL (ref 8–23)
CO2: 25 mmol/L (ref 22–32)
Calcium: 8.8 mg/dL — ABNORMAL LOW (ref 8.9–10.3)
Chloride: 103 mmol/L (ref 98–111)
Creatinine, Ser: 1.29 mg/dL — ABNORMAL HIGH (ref 0.44–1.00)
GFR, Estimated: 46 mL/min — ABNORMAL LOW (ref >60)
Glucose, Bld: 108 mg/dL — ABNORMAL HIGH (ref 70–99)
Potassium: 3.4 mmol/L — ABNORMAL LOW (ref 3.5–5.1)
Sodium: 138 mmol/L (ref 135–145)

## 2022-12-24 LAB — RAPID URINE DRUG SCREEN, HOSP PERFORMED
Amphetamines: NOT DETECTED
Barbiturates: NOT DETECTED
Benzodiazepines: NOT DETECTED
Cocaine: POSITIVE — AB
Opiates: NOT DETECTED
Tetrahydrocannabinol: POSITIVE — AB

## 2022-12-24 LAB — LACTIC ACID, PLASMA: Lactic Acid, Venous: 1.2 mmol/L (ref 0.5–1.9)

## 2022-12-24 MED ORDER — SODIUM CHLORIDE 0.9 % IV BOLUS
1000.0000 mL | Freq: Once | INTRAVENOUS | Status: AC
  Administered 2022-12-24: 1000 mL via INTRAVENOUS

## 2022-12-24 MED ORDER — ACETAMINOPHEN 325 MG PO TABS
650.0000 mg | ORAL_TABLET | Freq: Once | ORAL | Status: DC
  Filled 2022-12-24: qty 2

## 2022-12-24 MED ORDER — IOHEXOL 350 MG/ML SOLN
75.0000 mL | Freq: Once | INTRAVENOUS | Status: AC | PRN
  Administered 2022-12-24: 75 mL via INTRAVENOUS

## 2022-12-24 NOTE — ED Provider Notes (Signed)
  Physical Exam  BP 108/73   Pulse 66   Temp (!) 97.3 F (36.3 C) (Oral)   Resp 17   SpO2 92%   Physical Exam  Procedures  Procedures  ED Course / MDM    Medical Decision Making Care assumed at 3 PM.  Patient is here with syncope after doing marijuana.  Patient was hypotensive and hypoxic on arrival.  Signed out pending labs and reassessment  4 pm Patient is still altered.  I cannot get a straightforward history from her.  Given initial hypotension hypoxia, consider PE.  She also has buttock pain after the fall and unclear if she hit her head or not.  Will get CT head and cervical spine and CT chest abdomen pelvis  6:31 PM Patient is awake and alert now.  Sister is at bedside.  At this point she is stable for discharge. 6:07 PM CT scan unremarkable.  She remains very sleepy.  Will give another bolus of fluids and UDS is still pending.   Problems Addressed: Marijuana use: acute illness or injury Near syncope: acute illness or injury  Amount and/or Complexity of Data Reviewed Labs: ordered. Decision-making details documented in ED Course. Radiology: ordered and independent interpretation performed. Decision-making details documented in ED Course. ECG/medicine tests: ordered and independent interpretation performed. Decision-making details documented in ED Course.  Risk OTC drugs. Prescription drug management.          Charlynne Pander, MD 12/24/22 [phone]

## 2022-12-24 NOTE — ED Provider Notes (Signed)
[place] EMERGENCY DEPARTMENT AT [facility] Provider Note  CSN: [id number] Arrival date & time: 12/24/22 1339  Chief Complaint(s) No chief complaint on file.  HPI Gina Richards is a 62 y.o. female with PMH anxiety, bipolar 1, T2DM, HTN, stroke, cannabis use disorder, cocaine use who presents to the emergency department for an evaluation of a syncopal episode.  Patient states that she smoked marijuana for the first time in "a while" and then consumed an entire pack of bacon.  She then reportedly passed out.  She states that she does not remember the events and the last thing she remembers was eating bacon.  Here in the emergency room, patient appears intoxicated from [facility] and initially arrived mildly hypoxic and hypotensive in the emergency department lobby.  By the time of my evaluation, vital signs have improved and patient is saturating 93% on room air.  She currently denies chest pain, shortness of breath, abdominal pain, nausea, vomiting or other systemic symptoms.   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 Authorizing Provider  atorvastatin (LIPITOR) 20 MG tablet Take 20 mg by mouth at bedtime. Patient not taking: Reported on 07/02/2022 12/01/21   [provider]  benzonatate  (TESSALON) 100 MG capsule Take 1 capsule (100 mg total) by mouth every 8 (eight) hours. 08/16/22   Carlisle Beers, FNP  cyclobenzaprine (FLEXERIL) 10 MG tablet Take 1 tablet (10 mg total) by mouth 3 (three) times daily as needed for muscle spasms. DO NOT DRIVE WHILE TAKING THIS MEDICATION 06/14/20   Merrilee Jansky, MD  gabapentin (NEURONTIN) 800 MG tablet Take 800 mg by mouth daily as needed (for pain). 12/01/21   [provider]  Guaifenesin 1200 MG TB12 Take 1 tablet (1,200 mg total) by mouth in the morning and at bedtime. 08/16/22   Carlisle Beers, FNP  losartan-hydrochlorothiazide (HYZAAR) 50-12.5 MG tablet Take 1 tablet by mouth daily. 03/14/21   [provider]  metFORMIN (GLUCOPHAGE) 500 MG tablet Take 500 mg by mouth daily with breakfast. 04/07/21   [provider]  ondansetron (ZOFRAN-ODT) 4 MG disintegrating tablet Take 1 tablet (4 mg total) by mouth every 8 (eight) hours as needed for nausea or vomiting. 08/16/22   Carlisle Beers, FNP  QUEtiapine Fumarate (SEROQUEL XR) 150 MG 24 hr tablet Take 150 mg by mouth at bedtime. 05/09/21   [provider]  escitalopram (LEXAPRO) 20 MG tablet Take 1 tablet (20 mg total) by mouth daily. 04/19/19 01/10/20  Mardella Layman, MD  promethazine (PHENERGAN) 25 MG tablet Take 1 tablet (25 mg total) by mouth every 8 (eight) hours as needed for nausea or vomiting. Patient not taking: Reported on 05/10/2015 11/10/14 06/15/15  Charlestine Night, PA-C  traZODone (DESYREL) 50 MG tablet Take 50 mg  by mouth at bedtime. 03/13/17 04/19/19  [provider]                                                                                                                                    Past Surgical History Past Surgical History:  Procedure Laterality Date   TOTAL HIP ARTHROPLASTY Right 05/27/2021   Procedure: TOTAL HIP ARTHROPLASTY ANTERIOR APPROACH;  Surgeon: Jodi Geralds, MD;  Location: [facility] ORS;  Service:  Orthopedics;  Laterality: Right;   Family History Family History  Family history unknown: Yes    Social History Social History   Tobacco Use   Smoking status: Every Day    Packs/day: 1    Types: Cigarettes   Smokeless tobacco: Never  Vaping Use   Vaping Use: Never used  Substance Use Topics   Alcohol use: Yes    Comment: One beer but she will not inform how often.    Drug use: No   Allergies Patient has no known allergies.  Review of Systems Review of Systems  Neurological:  Positive for syncope.    Physical Exam Vital Signs  I have reviewed the triage vital signs BP 98/67   Pulse 67   Temp (!) 97.3 F (36.3 C) (Oral)   Resp 16   SpO2 93%   Physical Exam Vitals and nursing note reviewed.  Constitutional:      General: She is not in acute distress.    Appearance: She is well-developed.  HENT:     Head: Normocephalic and atraumatic.  Eyes:     Conjunctiva/sclera: Conjunctivae normal.  Cardiovascular:     Rate and Rhythm: Normal rate and regular rhythm.     Heart sounds: No murmur heard. Pulmonary:     Effort: Pulmonary effort is normal. No respiratory distress.     Breath sounds: Normal breath sounds.  Abdominal:     Palpations: Abdomen is soft.     Tenderness: There is no abdominal tenderness.  Musculoskeletal:        General: No swelling.     Cervical back: Neck supple.  Skin:    General: Skin is warm and dry.     Capillary Refill: Capillary refill takes less than 2 seconds.  Neurological:     Mental Status: She is alert.  Psychiatric:        Mood and Affect: Mood normal.     ED Results and Treatments Labs (all labs ordered are listed, but only abnormal results are displayed) Labs Reviewed  BASIC METABOLIC PANEL  CBC WITH DIFFERENTIAL/PLATELET  LACTIC ACID, PLASMA  LACTIC ACID, PLASMA  RAPID URINE DRUG SCREEN, HOSP PERFORMED  CBG MONITORING, ED  Radiology No results found.  Pertinent labs & imaging results that were available during my care of the patient were reviewed by me and considered in my medical decision making (see MDM for details).  Medications Ordered in ED Medications - No data to display                                                                                                                                   Procedures .Critical Care  Performed by: Glendora Score, MD Authorized by: Glendora Score, MD   Critical care provider statement:    Critical care time (minutes):  30   Critical care was necessary to treat or prevent imminent or life-threatening deterioration of the following conditions:  Circulatory failure   Critical care was time spent personally by me on the following activities:  Development of treatment plan with patient or surrogate, discussions with consultants, evaluation of patient's response to treatment, examination of patient, ordering and review of laboratory studies, ordering and review of radiographic studies, ordering and performing treatments and interventions, pulse oximetry, re-evaluation of patient's condition and review of old charts   (including critical care time)  Medical Decision Making / ED Course   This patient presents to the ED for concern of syncope, this involves an extensive number of treatment options, and is a complaint that carries with it a high risk of complications and morbidity.  The differential diagnosis includes polysubstance use, orthostatic syncope, cardiogenic syncope, vasovagal syncope, dehydration  MDM: Patient seen emergency room for evaluation of syncope.  Physical exam with dry mucous membranes, conjunctival injection bilaterally concerning for marijuana use.  Patient's hypoxia has appeared to resolve and is currently on room air with no respiratory distress.  Patient received 1 L lactated Ringer's for hypotension and on  reevaluation blood pressures are improving.  At time of signout, patient pending laboratory evaluation.  Please see provider signout for continuation of workup.  Anticipate discharge after metabolization of underlying marijuana.   Additional history obtained:  -External records from outside source obtained and reviewed including: Chart review including previous notes, labs, imaging, consultation notes   Lab Tests: -I ordered, reviewed, and interpreted labs.   The pertinent results include:   Labs Reviewed  BASIC METABOLIC PANEL  CBC WITH DIFFERENTIAL/PLATELET  LACTIC ACID, PLASMA  LACTIC ACID, PLASMA  RAPID URINE DRUG SCREEN, HOSP PERFORMED  CBG MONITORING, ED      EKG   EKG Interpretation  Date/Time:  Sunday Dec 24 2022 14:21:34 EDT Ventricular Rate:  74 PR Interval:  162 QRS Duration: 163 QT Interval:  508 QTC Calculation: 564 R Axis:   261 Text Interpretation: Sinus rhythm Right atrial enlargement RBBB and LAFB No significant change since last tracing Confirmed by Richardean Canal [phone]) on 12/24/2022 3:17:40 PM         Imaging Studies ordered: I ordered imaging studies including chest x-ray and this is pending   Medicines ordered  and prescription drug management: No orders of the defined types were placed in this encounter.   -I have reviewed the patients home medicines and have made adjustments as needed  Critical interventions Fluid resuscitation    Cardiac Monitoring: The patient was maintained on a cardiac monitor.  I personally viewed and interpreted the cardiac monitored which showed an underlying rhythm of: NSR  Social Determinants of Health:  Factors impacting patients care include: Marijuana use   Reevaluation: After the interventions noted above, I reevaluated the patient and found that they have :improved  Co morbidities that complicate the patient evaluation  Past Medical History:  Diagnosis Date   Anxiety    Bipolar 1 disorder (HCC)     Depression    Diabetes mellitus without complication (HCC)    Hypertension    Pneumonia    Stroke [facility])       Dispostion: I considered admission for this patient, and disposition pending completion of laboratory evaluation.  Please see provider signout for continuation of workup.     Final Clinical Impression(s) / ED Diagnoses Final diagnoses:  None     @PCDICTATION @    Glendora Score, MD 12/24/22 1821

## 2022-12-24 NOTE — Discharge Instructions (Signed)
You likely passed out from using marijuana.  Please avoid using marijuana and stay hydrated  See your doctor for follow-up  Return to ER if you have another episode of passing out, chest pain or shortness of breath

## 2022-12-24 NOTE — ED Triage Notes (Signed)
Patient arrived by [facility] from home after syncopal episode. Patient complains of left side pain, hypotensive and reports smoking marijuana

## 2022-12-24 NOTE — ED Provider Triage Note (Signed)
Emergency Medicine Provider Triage Evaluation Note  Gina Richards , a 62 y.o. female  was evaluated in triage.  Pt brought into the ED by EMS for syncopal episode.  Patient will not provide much history to me.  She will open her eyes and respond to verbal and tactile stimuli but does not answer most questions.  She does deny any pain.  She states that she was brought here because she passed out but otherwise provides no history apart from that this is never happened before.  Review of Systems  Positive: Limited secondary to lack of patient cooperation Negative: Limited secondary to lack of patient cooperation  Physical Exam  BP (!) 75/52 (BP Location: Right Arm)   Pulse 67   Temp (!) 97.3 F (36.3 C) (Oral)   Resp 16   SpO2 93%  Gen:   no distress   Resp:  Normal effort lungs clear to auscultation MSK:   Moves extremities x 4 Other:  Easily arousable to verbal and tactile stimuli but not answering most questions, does appear slightly lethargic though mostly uncooperative, did arrive hypotensive and will be placed in its bed, abdomen soft and nontender, no obvious focal deficits  Medical Decision Making  Medically screening exam initiated at 1:56 PM.  Appropriate orders placed.  Gina Richards was informed that the remainder of the evaluation will be completed by another provider, this initial triage assessment does not replace that evaluation, and the importance of remaining in the ED until their evaluation is complete.     Tonette Lederer, PA-C 12/24/22 1357

## 2023-01-16 ENCOUNTER — Other Ambulatory Visit: Payer: Self-pay | Admitting: Registered Nurse

## 2023-01-16 DIAGNOSIS — R0989 Other specified symptoms and signs involving the circulatory and respiratory systems: Secondary | ICD-10-CM

## 2023-01-27 ENCOUNTER — Other Ambulatory Visit: Payer: Self-pay

## 2023-01-27 ENCOUNTER — Emergency Department (HOSPITAL_COMMUNITY): Payer: 59

## 2023-01-27 ENCOUNTER — Encounter (HOSPITAL_COMMUNITY): Payer: Self-pay

## 2023-01-27 ENCOUNTER — Emergency Department (HOSPITAL_COMMUNITY)
Admission: EM | Admit: 2023-01-27 | Discharge: 2023-01-27 | Disposition: A | Discharge status: Home / Self Care | Payer: 59 | Attending: Emergency Medicine | Admitting: Emergency Medicine

## 2023-01-27 DIAGNOSIS — I10 Essential (primary) hypertension: Secondary | ICD-10-CM | POA: Diagnosis not present

## 2023-01-27 DIAGNOSIS — E876 Hypokalemia: Secondary | ICD-10-CM | POA: Insufficient documentation

## 2023-01-27 DIAGNOSIS — Z7984 Long term (current) use of oral hypoglycemic drugs: Secondary | ICD-10-CM | POA: Insufficient documentation

## 2023-01-27 DIAGNOSIS — Z79899 Other long term (current) drug therapy: Secondary | ICD-10-CM | POA: Diagnosis not present

## 2023-01-27 DIAGNOSIS — R4182 Altered mental status, unspecified: Secondary | ICD-10-CM | POA: Diagnosis present

## 2023-01-27 DIAGNOSIS — E119 Type 2 diabetes mellitus without complications: Secondary | ICD-10-CM | POA: Insufficient documentation

## 2023-01-27 DIAGNOSIS — F1990 Other psychoactive substance use, unspecified, uncomplicated: Secondary | ICD-10-CM

## 2023-01-27 LAB — ACETAMINOPHEN LEVEL: Acetaminophen (Tylenol), Serum: <10 ug/mL — ABNORMAL LOW (ref 10–30)

## 2023-01-27 LAB — COMPREHENSIVE METABOLIC PANEL
ALT: 58 U/L — ABNORMAL HIGH (ref 0–44)
AST: 48 U/L — ABNORMAL HIGH (ref 15–41)
Albumin: 3.6 g/dL (ref 3.5–5.0)
Alkaline Phosphatase: 108 U/L (ref 38–126)
Anion gap: 10 (ref 5–15)
BUN: 10 mg/dL (ref 8–23)
CO2: 27 mmol/L (ref 22–32)
Calcium: 9 mg/dL (ref 8.9–10.3)
Chloride: 101 mmol/L (ref 98–111)
Creatinine, Ser: 0.73 mg/dL (ref 0.44–1.00)
GFR, Estimated: >60 mL/min (ref >60)
Glucose, Bld: 107 mg/dL — ABNORMAL HIGH (ref 70–99)
Potassium: 2.9 mmol/L — ABNORMAL LOW (ref 3.5–5.1)
Sodium: 138 mmol/L (ref 135–145)
Total Bilirubin: 0.6 mg/dL (ref 0.3–1.2)
Total Protein: 7.2 g/dL (ref 6.5–8.1)

## 2023-01-27 LAB — CBC WITH DIFFERENTIAL/PLATELET
Abs Immature Granulocytes: 0.02 10*3/uL (ref 0.00–0.07)
Basophils Absolute: 0 10*3/uL (ref 0.0–0.1)
Basophils Relative: 0 %
Eosinophils Absolute: 0.1 10*3/uL (ref 0.0–0.5)
Eosinophils Relative: 2 %
HCT: 37.2 % (ref 36.0–46.0)
Hemoglobin: 12.4 g/dL (ref 12.0–15.0)
Immature Granulocytes: 0 %
Lymphocytes Relative: 28 %
Lymphs Abs: 1.9 10*3/uL (ref 0.7–4.0)
MCH: 31.5 pg (ref 26.0–34.0)
MCHC: 33.3 g/dL (ref 30.0–36.0)
MCV: 94.4 fL (ref 80.0–100.0)
Monocytes Absolute: 0.6 10*3/uL (ref 0.1–1.0)
Monocytes Relative: 9 %
Neutro Abs: 4.2 10*3/uL (ref 1.7–7.7)
Neutrophils Relative %: 61 %
Platelets: 295 10*3/uL (ref 150–400)
RBC: 3.94 MIL/uL (ref 3.87–5.11)
RDW: 13.8 % (ref 11.5–15.5)
WBC: 6.9 10*3/uL (ref 4.0–10.5)
nRBC: 0 % (ref 0.0–0.2)

## 2023-01-27 LAB — SALICYLATE LEVEL: Salicylate Lvl: <7 mg/dL — ABNORMAL LOW (ref 7.0–30.0)

## 2023-01-27 LAB — ETHANOL: Alcohol, Ethyl (B): <10 mg/dL (ref <10)

## 2023-01-27 LAB — AMMONIA: Ammonia: 20 umol/L (ref 9–35)

## 2023-01-27 MED ORDER — POTASSIUM CHLORIDE CRYS ER 20 MEQ PO TBCR
40.0000 meq | EXTENDED_RELEASE_TABLET | Freq: Once | ORAL | Status: AC
  Administered 2023-01-27: 40 meq via ORAL
  Filled 2023-01-27: qty 2

## 2023-01-27 NOTE — ED Provider Notes (Signed)
[place] EMERGENCY DEPARTMENT AT [facility] Provider Note   CSN: [id number] Arrival date & time: 01/27/23  0016     History  Chief Complaint  Patient presents with   Altered Mental Status    MARGART ZEMANEK is a 62 y.o. female.  The history is provided by the EMS personnel.  Altered Mental Status  62 y.o. F with hx of HTN, DM, prior stroke, bipolar disorder, anxiety, presenting to the ED for medication OD.  Apparently took extra doses of all of her medications tonight including atorvastatin, doxepin, losartan, and seroquel.  Also took 2 unisom tablets.  Unknown ingestion time.  Reportedly she just wanted to sleep.  She was hypotensive with EMS to 75/49, sats 81% improved with IVF and supplemental O2.  Home Medications Prior to Admission medications   Medication Sig Start Date End Date Taking? Authorizing Provider  atorvastatin (LIPITOR) 20 MG tablet Take 20 mg by mouth at bedtime. Patient not taking: Reported on 07/02/2022 12/01/21   [provider]  benzonatate (TESSALON) 100 MG capsule Take 1 capsule (100 mg total) by mouth every 8 (eight) hours. 08/16/22   Carlisle Beers, FNP  cyclobenzaprine (FLEXERIL) 10 MG tablet Take 1 tablet (10 mg total) by mouth 3 (three) times daily as needed for muscle spasms. DO NOT DRIVE WHILE TAKING THIS MEDICATION 06/14/20   Merrilee Jansky, MD  gabapentin (NEURONTIN) 800 MG tablet Take 800 mg by mouth daily as needed (for pain). 12/01/21   [provider]  Guaifenesin 1200 MG TB12 Take 1 tablet (1,200 mg total) by mouth in the morning and at bedtime. 08/16/22   Carlisle Beers, FNP  losartan-hydrochlorothiazide (HYZAAR) 50-12.5 MG tablet Take 1 tablet by mouth daily. 03/14/21   [provider]  metFORMIN (GLUCOPHAGE) 500 MG tablet Take 500 mg by mouth daily with breakfast. 04/07/21   [provider]  ondansetron (ZOFRAN-ODT) 4 MG disintegrating tablet Take 1 tablet (4 mg total) by mouth  every 8 (eight) hours as needed for nausea or vomiting. 08/16/22   Carlisle Beers, FNP  QUEtiapine Fumarate (SEROQUEL XR) 150 MG 24 hr tablet Take 150 mg by mouth at bedtime. 05/09/21   [provider]  escitalopram (LEXAPRO) 20 MG tablet Take 1 tablet (20 mg total) by mouth daily. 04/19/19 01/10/20  Mardella Layman, MD  promethazine (PHENERGAN) 25 MG tablet Take 1 tablet (25 mg total) by mouth every 8 (eight) hours as needed for nausea or vomiting. Patient not taking: Reported on 05/10/2015 11/10/14 06/15/15  Charlestine Night, PA-C  traZODone (DESYREL) 50 MG tablet Take 50 mg by mouth at bedtime. 03/13/17 04/19/19  [provider]      Allergies    Patient has no known allergies.    Review of Systems   Review of Systems  Unable to perform ROS: Mental status change    Physical Exam Updated Vital Signs BP 114/84   Pulse 65   Temp 97.8 F (36.6 C) (Oral)   Resp 15   SpO2 98%   Physical Exam Vitals and nursing note reviewed.  Constitutional:      Appearance: She is well-developed.     Comments: Drowsy appearing, snoring on exam  HENT:     Head: Normocephalic and atraumatic.  Eyes:     Conjunctiva/sclera: Conjunctivae normal.     Pupils: Pupils are equal, round, and reactive to light.  Cardiovascular:     Rate and Rhythm: Normal rate and regular rhythm.     Heart  sounds: Normal heart sounds.  Pulmonary:     Effort: Pulmonary effort is normal.     Breath sounds: Normal breath sounds.  Abdominal:     General: Bowel sounds are normal.     Palpations: Abdomen is soft.  Musculoskeletal:        General: Normal range of motion.     Cervical back: Normal range of motion.  Skin:    General: Skin is warm and dry.  Neurological:     Comments: Drowsy, sleeping, is not answering questions currently     ED Results / Procedures / Treatments   Labs (all labs ordered are listed, but only abnormal results are displayed) Labs Reviewed  COMPREHENSIVE METABOLIC  PANEL - Abnormal; Notable for the following components:      Result Value   Potassium 2.9 (*)    Glucose, Bld 107 (*)    AST 48 (*)    ALT 58 (*)    All other components within normal limits  SALICYLATE LEVEL - Abnormal; Notable for the following components:   Salicylate Lvl <7.0 (*)    All other components within normal limits  ACETAMINOPHEN LEVEL - Abnormal; Notable for the following components:   Acetaminophen (Tylenol), Serum <10 (*)    All other components within normal limits  CBC WITH DIFFERENTIAL/PLATELET  ETHANOL  AMMONIA  RAPID URINE DRUG SCREEN, HOSP PERFORMED    EKG None  Radiology DG Chest Port 1 View  Result Date: 01/27/2023 CLINICAL DATA:  Altered mental status EXAM: PORTABLE CHEST 1 VIEW COMPARISON:  None Available. FINDINGS: Lungs are well expanded, symmetric, and clear. No pneumothorax or pleural effusion. Cardiac size within normal limits. Pulmonary vascularity is normal. Osseous structures are age-appropriate. No acute bone abnormality. IMPRESSION: No active disease. Electronically Signed   By: Helyn Numbers M.D.   On: 01/27/2023 02:09    Procedures Procedures    Medications Ordered in ED Medications  potassium chloride SA (KLOR-CON M) CR tablet 40 mEq (40 mEq Oral Given 01/27/23 0510)    ED Course/ Medical Decision Making/ A&P                             Medical Decision Making Amount and/or Complexity of Data Reviewed Labs: ordered. Radiology: ordered and independent interpretation performed. ECG/medicine tests: ordered and independent interpretation performed.  Risk Prescription drug management.   62 year old female presenting to the ED with altered mental status.  Apparently took double doses of all of her home medications including atorvastatin, doxepin, losartan, and Seroquel along with 2 tablets of Unisom.  Unclear ingestion time.  She was hypotensive and hypoxic with EMS, did improve with supplemental O2 and IV fluids.  EMS did try Narcan  without much change.  Patient is drowsy and snoring upon arrival but hemodynamically stable.  She will need to metabolize.  EKG, labs pending.  EKG NSR, no acute ischemia noted.  Labs as above-- K+ low at 2.9, will give PO replacement once fully awake/alert.  Negative Tylenol, salicylate and ethanol levels.  Ammonia is normal.  UDS is pending.  Chest x-ray clear.  Patient still quite drowsy, will continue to observe.  4:32 AM Patient still a bit drowsy/sleepy but improved from prior.  She has been intermittently waking up but only for brief time.  She did have episode of incontinence before RN could get her to the bathroom.  She is asking to eat now.  Will try K+ replacement.   5:01 AM  Patient currently up and eating sandwich.  States she feels ok.  Admits she "overdid it" with the medication as she does not remember coming to the ED.  Admits to trouble sleeping recently due to ongoing issues with left leg so was just trying to get some good sleep.  Adamantly denies any SI or intended self harm.    5:38 AM Has tolerated meal and PO K+.  VSS.  Feel she is stable for discharge.  Recommend that she only take medications as prescribed, avoid taking extra doses.  Could try melatonin or similar for sleep.  Follow-up with PCP.  Return here for new concerns.  Final Clinical Impression(s) / ED Diagnoses Final diagnoses:  Misuse of medication  Hypokalemia    Rx / DC Orders ED Discharge Orders     None         Garlon Hatchet, PA-C 01/27/23 0543    Nira Conn, MD 01/28/23 [phone]

## 2023-01-27 NOTE — Discharge Instructions (Signed)
Only take your medications as directed, avoid taking extra doses.  If needing help with sleep, can try melatonin or similar.  If needing more than this, speak to your doctor. Follow-up with your primary care doctor. Return here for new concerns.

## 2023-01-27 NOTE — ED Triage Notes (Signed)
Patient brought in by [facility], reports that patient took extra medicine tonight to fall asleep. States she just wanted to sleep. Took twice the amount of her normal prescribed meds including atorvastatin, doxepin, losartan, and seroquel. Also took unisom, possibly two tablets. EMS reports she is lethargic and has slow responses, BP 75/49 and 81% on RA, was given NS and BP improved to 102/55, sats came up to 95% on 3L [place]. Patient also given 0.08mg  narcan pta, EMS reports minimal improvement, GCS 13.

## 2023-02-23 ENCOUNTER — Other Ambulatory Visit: Payer: 59

## 2023-04-02 ENCOUNTER — Other Ambulatory Visit: Payer: 59

## 2023-05-29 ENCOUNTER — Ambulatory Visit
Admission: RE | Admit: 2023-05-29 | Discharge: 2023-05-29 | Disposition: A | Discharge status: Home / Self Care | Payer: Medicare HMO | Source: Ambulatory Visit | Attending: Registered Nurse | Admitting: Registered Nurse

## 2023-05-29 ENCOUNTER — Ambulatory Visit
Admission: RE | Admit: 2023-05-29 | Discharge: 2023-05-29 | Disposition: A | Discharge status: Home / Self Care | Payer: Medicare HMO | Source: Ambulatory Visit | Attending: Registered Nurse

## 2023-05-29 DIAGNOSIS — R0989 Other specified symptoms and signs involving the circulatory and respiratory systems: Secondary | ICD-10-CM

## 2023-07-11 ENCOUNTER — Other Ambulatory Visit: Payer: Self-pay | Admitting: Physician Assistant

## 2023-07-11 DIAGNOSIS — R5381 Other malaise: Secondary | ICD-10-CM

## 2023-08-30 ENCOUNTER — Encounter: Payer: Self-pay | Admitting: Registered Nurse

## 2023-09-19 ENCOUNTER — Emergency Department (HOSPITAL_BASED_OUTPATIENT_CLINIC_OR_DEPARTMENT_OTHER)
Admission: EM | Admit: 2023-09-19 | Discharge: 2023-09-19 | Disposition: A | Discharge status: Home / Self Care | Payer: 59 | Attending: Emergency Medicine | Admitting: Emergency Medicine

## 2023-09-19 ENCOUNTER — Encounter (HOSPITAL_BASED_OUTPATIENT_CLINIC_OR_DEPARTMENT_OTHER): Payer: Self-pay | Admitting: Emergency Medicine

## 2023-09-19 ENCOUNTER — Other Ambulatory Visit: Payer: Self-pay

## 2023-09-19 DIAGNOSIS — H5712 Ocular pain, left eye: Secondary | ICD-10-CM | POA: Diagnosis present

## 2023-09-19 DIAGNOSIS — I10 Essential (primary) hypertension: Secondary | ICD-10-CM | POA: Diagnosis not present

## 2023-09-19 DIAGNOSIS — Z7984 Long term (current) use of oral hypoglycemic drugs: Secondary | ICD-10-CM | POA: Insufficient documentation

## 2023-09-19 DIAGNOSIS — H578A2 Foreign body sensation, left eye: Secondary | ICD-10-CM | POA: Insufficient documentation

## 2023-09-19 DIAGNOSIS — E119 Type 2 diabetes mellitus without complications: Secondary | ICD-10-CM | POA: Diagnosis not present

## 2023-09-19 MED ORDER — ERYTHROMYCIN 5 MG/GM OP OINT
TOPICAL_OINTMENT | OPHTHALMIC | Status: AC
  Administered 2023-09-19: 1 via OPHTHALMIC
  Filled 2023-09-19: qty 3.5

## 2023-09-19 MED ORDER — FLUORESCEIN SODIUM 1 MG OP STRP
1.0000 | ORAL_STRIP | Freq: Once | OPHTHALMIC | Status: AC
  Administered 2023-09-19: 1 via OPHTHALMIC
  Filled 2023-09-19: qty 1

## 2023-09-19 MED ORDER — TETRACAINE HCL 0.5 % OP SOLN
2.0000 [drp] | Freq: Once | OPHTHALMIC | Status: AC
  Administered 2023-09-19: 2 [drp] via OPHTHALMIC
  Filled 2023-09-19: qty 4

## 2023-09-19 NOTE — ED Triage Notes (Signed)
Feels like she has something stuck in left eye. Irritated, watering. 2hrs PTA.

## 2023-09-19 NOTE — ED Notes (Signed)

## 2023-09-19 NOTE — ED Notes (Signed)
Taxi voucher provided.

## 2023-09-19 NOTE — Discharge Instructions (Signed)
You were seen for your eye irritation in the emergency department.   At home, please take the eye ointment (erythromycin) that we have given you.  Be sure to take your blood pressure medications daily.    Check your MyChart online for the results of any tests that had not resulted by the time you left the emergency department.   Follow-up with your primary doctor in 2-3 days regarding your visit.  Follow-up with an ophthalmologist as soon as possible.  Return immediately to the emergency department if you experience any of the following: Worsening pain, vision changes, or any other concerning symptoms.    Thank you for visiting our Emergency Department. It was a pleasure taking care of you today.

## 2023-09-19 NOTE — ED Provider Notes (Signed)
Gina Richards Provider Note   CSN: [id number] Arrival date & time: 09/19/23  1552     History  Chief Complaint  Patient presents with   Eye Problem    Gina Richards is a 63 y.o. female.  63 year old female with history of bipolar, anxiety, diabetes, and stroke who presents to the emergency department with eye pain.  Patient reports that today at 1 PM started experiencing foreign body sensation in her left eye.  Started become more painful.  Says she is having a difficult time seeing because of all the tearing out of her eye.  No known trauma.  Does not wear contacts but does wear glasses.  No recent URI symptoms.       Home Medications Prior to Admission medications   Medication Sig Start Date End Date Taking? Authorizing Provider  atorvastatin (LIPITOR) 20 MG tablet Take 20 mg by mouth at bedtime. Patient not taking: Reported on 07/02/2022 12/01/21   [provider]  benzonatate (TESSALON) 100 MG capsule Take 1 capsule (100 mg total) by mouth every 8 (eight) hours. 08/16/22   Carlisle Beers, FNP  cyclobenzaprine (FLEXERIL) 10 MG tablet Take 1 tablet (10 mg total) by mouth 3 (three) times daily as needed for muscle spasms. DO NOT DRIVE WHILE TAKING THIS MEDICATION 06/14/20   Merrilee Jansky, MD  gabapentin (NEURONTIN) 800 MG tablet Take 800 mg by mouth daily as needed (for pain). 12/01/21   [provider]  Guaifenesin 1200 MG TB12 Take 1 tablet (1,200 mg total) by mouth in the morning and at bedtime. 08/16/22   Carlisle Beers, FNP  losartan-hydrochlorothiazide (HYZAAR) 50-12.5 MG tablet Take 1 tablet by mouth daily. 03/14/21   [provider]  metFORMIN (GLUCOPHAGE) 500 MG tablet Take 500 mg by mouth daily with breakfast. 04/07/21   [provider]  ondansetron (ZOFRAN-ODT) 4 MG disintegrating tablet Take 1 tablet (4 mg total) by mouth every 8 (eight) hours as needed for nausea or vomiting.  08/16/22   Carlisle Beers, FNP  QUEtiapine Fumarate (SEROQUEL XR) 150 MG 24 hr tablet Take 150 mg by mouth at bedtime. 05/09/21   [provider]  escitalopram (LEXAPRO) 20 MG tablet Take 1 tablet (20 mg total) by mouth daily. 04/19/19 01/10/20  Mardella Layman, MD  promethazine (PHENERGAN) 25 MG tablet Take 1 tablet (25 mg total) by mouth every 8 (eight) hours as needed for nausea or vomiting. Patient not taking: Reported on 05/10/2015 11/10/14 06/15/15  Charlestine Night, PA-C  traZODone (DESYREL) 50 MG tablet Take 50 mg by mouth at bedtime. 03/13/17 04/19/19  [provider]      Allergies    Patient has no known allergies.    Review of Systems   Review of Systems  Physical Exam Updated Vital Signs BP (!) 142/120   Pulse 76   Temp 97.9 F (36.6 C)   Resp 18   SpO2 98%  Physical Exam Constitutional:      Appearance: Normal appearance.  Eyes:     General:        Right eye: No foreign body, discharge or hordeolum.        Left eye: No foreign body, discharge or hordeolum.     Intraocular pressure: Left eye pressure is 14 mmHg. Measurements were taken using a handheld tonometer.    Extraocular Movements: Extraocular movements intact.     Conjunctiva/sclera:     Right eye: Right conjunctiva is not injected. No chemosis,  exudate or hemorrhage.    Left eye: Left conjunctiva is injected. No chemosis, exudate or hemorrhage.    Comments: Pupils 4 mm and reactive bilaterally.  Fluorescein exam did not show evidence of corneal abrasion.  No foreign body noted on patient's superior-inferior aspect of the eyelids or elsewhere in the eye.  Neurological:     Mental Status: She is alert.     ED Results / Procedures / Treatments   Labs (all labs ordered are listed, but only abnormal results are displayed) Labs Reviewed - No data to display  EKG None  Radiology No results found.  Procedures Procedures    Medications Ordered in ED Medications  erythromycin  ophthalmic ointment (has no administration in time range)  tetracaine (PONTOCAINE) 0.5 % ophthalmic solution 2 drop (2 drops Left Eye Given by Other 09/19/23 1738)  fluorescein ophthalmic strip 1 strip (1 strip Left Eye Given by Other 09/19/23 1738)    ED Course/ Medical Decision Making/ A&P                                 Medical Decision Making Risk Prescription drug management.   Gina Richards is a 63 y.o. female with comorbidities that complicate the patient evaluation including bipolar, diabetes, and stroke who presents emergency department for body sensation in her left eye  Initial Ddx:  Foreign body, corneal abrasion, iritis, anterior uveitis, viral conjunctivitis, acute angle-closure glaucoma  MDM/Course:  Patient presents emergency department with left eye pain and swelling.  On exam has reactive pupils bilaterally.  Fluorescein exam without any focal uptake at all.  Does have significantly injected conjunctive on the left.  Suspect that the patient likely has very very small abrasion or irritation in the setting of rubbing her eyes frequently.  Could potentially be due to viral conjunctivitis but no signs of bacterial conjunctivitis currently.  To prevent any superimposed infection we will give her some erythromycin and have her follow-up with ophthalmology as an outpatient.  Patient was also noted to have an elevated blood pressure and says that she did miss a dose of her blood pressure medication.  Counseled to take this and record her blood pressure over the next few days and follow-up with her primary doctor as well as ophthalmology.  This patient presents to the ED for concern of complaints listed in HPI, this involves an extensive number of treatment options, and is a complaint that carries with it a high risk of complications and morbidity. Disposition including potential need for admission considered.   Dispo: DC Home. Return precautions discussed including, but not  limited to, those listed in the AVS. Allowed pt time to ask questions which were answered fully prior to dc.  Records reviewed Outpatient Clinic Notes I personally reviewed and interpreted cardiac monitoring: normal sinus rhythm  I personally reviewed and interpreted the pt's EKG: see above for interpretation  I have reviewed the patients home medications and made adjustments as needed  Portions of this note were generated with Dragon dictation software. Dictation errors may occur despite best attempts at proofreading.     Final Clinical Impression(s) / ED Diagnoses Final diagnoses:  Foreign body sensation, left eye  Uncontrolled hypertension    Rx / DC Orders ED Discharge Orders     None         Rondel Baton, MD 09/19/23 [phone]

## 2024-02-01 ENCOUNTER — Other Ambulatory Visit: Payer: Self-pay

## 2024-02-01 ENCOUNTER — Encounter (HOSPITAL_COMMUNITY): Payer: Self-pay

## 2024-02-01 ENCOUNTER — Emergency Department (HOSPITAL_COMMUNITY)
Admission: EM | Admit: 2024-02-01 | Discharge: 2024-02-01 | Disposition: A | Discharge status: Home / Self Care | Attending: Emergency Medicine | Admitting: Emergency Medicine

## 2024-02-01 DIAGNOSIS — Z720 Tobacco use: Secondary | ICD-10-CM | POA: Diagnosis not present

## 2024-02-01 DIAGNOSIS — E119 Type 2 diabetes mellitus without complications: Secondary | ICD-10-CM | POA: Diagnosis not present

## 2024-02-01 DIAGNOSIS — Z8673 Personal history of transient ischemic attack (TIA), and cerebral infarction without residual deficits: Secondary | ICD-10-CM | POA: Insufficient documentation

## 2024-02-01 DIAGNOSIS — Z79899 Other long term (current) drug therapy: Secondary | ICD-10-CM | POA: Diagnosis not present

## 2024-02-01 DIAGNOSIS — I10 Essential (primary) hypertension: Secondary | ICD-10-CM | POA: Insufficient documentation

## 2024-02-01 DIAGNOSIS — S0502XA Injury of conjunctiva and corneal abrasion without foreign body, left eye, initial encounter: Secondary | ICD-10-CM | POA: Insufficient documentation

## 2024-02-01 DIAGNOSIS — X58XXXA Exposure to other specified factors, initial encounter: Secondary | ICD-10-CM | POA: Diagnosis not present

## 2024-02-01 DIAGNOSIS — Z7984 Long term (current) use of oral hypoglycemic drugs: Secondary | ICD-10-CM | POA: Insufficient documentation

## 2024-02-01 DIAGNOSIS — H5712 Ocular pain, left eye: Secondary | ICD-10-CM | POA: Diagnosis present

## 2024-02-01 MED ORDER — ERYTHROMYCIN 5 MG/GM OP OINT: TOPICAL_OINTMENT | OPHTHALMIC | 0 refills | Status: AC

## 2024-02-01 MED ORDER — FLUORESCEIN SODIUM 1 MG OP STRP
1.0000 | ORAL_STRIP | Freq: Once | OPHTHALMIC | Status: AC
  Administered 2024-02-01: 1 via OPHTHALMIC
  Filled 2024-02-01: qty 1

## 2024-02-01 MED ORDER — TETRACAINE HCL 0.5 % OP SOLN
2.0000 [drp] | Freq: Once | OPHTHALMIC | Status: AC
  Administered 2024-02-01: 2 [drp] via OPHTHALMIC
  Filled 2024-02-01: qty 4

## 2024-02-01 MED ORDER — ERYTHROMYCIN 5 MG/GM OP OINT
1.0000 | TOPICAL_OINTMENT | Freq: Once | OPHTHALMIC | Status: AC
  Administered 2024-02-01: 1 via OPHTHALMIC
  Filled 2024-02-01: qty 3.5

## 2024-02-01 NOTE — ED Triage Notes (Signed)
 [place] and felt something was in her eye.  EMS flushed it numerous times and patient still complaining.

## 2024-02-01 NOTE — ED Provider Triage Note (Signed)
 Emergency Medicine Provider Triage Evaluation Note  Gina Richards , a 63 y.o. female  was evaluated in triage.  Pt complains of foreign body sensation in left eye, says something went into her eye [place] the bus[place] today, she was rubbing it, now it hurts.  Doesn't wear contacts  Review of Systems  Positive: Pain in eye Negative: Fever  Physical Exam  BP (!) 184/122   Pulse 77   Temp 98.3 F (36.8 C)   Resp 18   Ht 5' 2.5 (1.588 m)   Wt 71.7 kg   SpO2 97%   BMI 28.44 kg/m  Gen:   Awake, no distress   Resp:  Normal effort  MSK:   Moves extremities without difficulty  Other:  Left conjunctiva injected, no hypopion  Medical Decision Making  Medically screening exam initiated at 6:45 PM.  Appropriate orders placed.  Gina Richards was informed that the remainder of the evaluation will be completed by another provider, this initial triage assessment does not replace that evaluation, and the importance of remaining in the ED until their evaluation is complete.  Foreign body feeling left eye   Gina Richards, Gina Me, MD 02/01/24 [phone]

## 2024-02-01 NOTE — ED Notes (Signed)
 Discharge instructions reviewed.   Newly prescribed medications discussed. Pharmacy verified.   Opportunity for questions and concerns provided.   Alert, oriented and escorted to waiting room via wheelchair until taxi arrives.   Displays no signs of distress. Declines discharge vital signs.

## 2024-02-01 NOTE — Discharge Instructions (Addendum)
 Use erythromycin  ointment on your left eye 4 times a day for the next 5 days.  You have an appointment 9 AM Monday with ophthalmology.  I have provided the information above.  Call their office if you cannot make that appointment.  Get help right away if: You have very bad eye pain that does not get better with medicine. You lose your eyesight.

## 2024-02-01 NOTE — ED Provider Notes (Signed)
 [place] EMERGENCY DEPARTMENT AT [facility] Provider Note   CSN: [id number] Arrival date & time: 02/01/24  1830     Patient presents with: Eye Pain   Gina Richards is a 63 y.o. female with a history of stroke, bipolar disorder, hypertension, diabetes mellitus who presents the ED today for eye pain.  Patient reports that she was at the bus stop when she started itching her left eye.  States that since then her eye has become extremely painful and has been watering.  Denies any injury or trauma.  EMS flushed patient's eye multiple times without improvement.  She wears glasses but not contacts.    Prior to Admission medications   Medication Sig Start Date End Date Taking? Authorizing Provider  erythromycin  ophthalmic ointment Place a 1/2 inch ribbon of ointment into the lower eyelid four times a day for 5 days 02/01/24  Yes Sonnie Dusky, PA-C  atorvastatin (LIPITOR) 20 MG tablet Take 20 mg by mouth at bedtime. Patient not taking: Reported on 07/02/2022 12/01/21   [provider]  benzonatate  (TESSALON ) 100 MG capsule Take 1 capsule (100 mg total) by mouth every 8 (eight) hours. 08/16/22   Starlene Eaton, FNP  cyclobenzaprine  (FLEXERIL ) 10 MG tablet Take 1 tablet (10 mg total) by mouth 3 (three) times daily as needed for muscle spasms. DO NOT DRIVE WHILE TAKING THIS MEDICATION 06/14/20   Corine Dice, MD  gabapentin  (NEURONTIN ) 800 MG tablet Take 800 mg by mouth daily as needed (for pain). 12/01/21   [provider]  Guaifenesin  1200 MG TB12 Take 1 tablet (1,200 mg total) by mouth in the morning and at bedtime. 08/16/22   Starlene Eaton, FNP  losartan -hydrochlorothiazide  (HYZAAR) 50-12.5 MG tablet Take 1 tablet by mouth daily. 03/14/21   [provider]  metFORMIN  (GLUCOPHAGE ) 500 MG tablet Take 500 mg by mouth daily with breakfast. 04/07/21   [provider]  ondansetron  (ZOFRAN -ODT) 4 MG disintegrating tablet Take 1 tablet (4  mg total) by mouth every 8 (eight) hours as needed for nausea or vomiting. 08/16/22   Starlene Eaton, FNP  QUEtiapine  Fumarate (SEROQUEL  XR) 150 MG 24 hr tablet Take 150 mg by mouth at bedtime. 05/09/21   [provider]  escitalopram  (LEXAPRO ) 20 MG tablet Take 1 tablet (20 mg total) by mouth daily. 04/19/19 01/10/20  Afton Albright, MD  promethazine  (PHENERGAN ) 25 MG tablet Take 1 tablet (25 mg total) by mouth every 8 (eight) hours as needed for nausea or vomiting. Patient not taking: Reported on 05/10/2015 11/10/14 06/15/15  Mirta Ammon, PA-C  traZODone (DESYREL) 50 MG tablet Take 50 mg by mouth at bedtime. 03/13/17 04/19/19  [provider]    Allergies: Patient has no known allergies.    Review of Systems  Eyes:  Positive for pain.  All other systems reviewed and are negative.   Updated Vital Signs BP (!) 184/122   Pulse 77   Temp 98.3 F (36.8 C)   Resp 18   Ht 5' 2.5 (1.588 m)   Wt 71.7 kg   SpO2 97%   BMI 28.44 kg/m   Physical Exam Vitals and nursing note reviewed.  Constitutional:      Appearance: Normal appearance.  HENT:     Head: Normocephalic and atraumatic.     Mouth/Throat:     Mouth: Mucous membranes are moist.   Eyes:     Extraocular Movements: Extraocular movements intact.     Pupils: Pupils are equal, round,  and reactive to light.     Comments: Conjunctival injections of the left eye.  Corneal abrasion at the left iris/pupil on Woods lamp exam. Right eye vision 20/25 Left eye vision 20/30   Cardiovascular:     Rate and Rhythm: Normal rate and regular rhythm.     Pulses: Normal pulses.  Pulmonary:     Effort: Pulmonary effort is normal.     Breath sounds: Normal breath sounds.  Abdominal:     Palpations: Abdomen is soft.     Tenderness: There is no abdominal tenderness.   Skin:    General: Skin is warm and dry.     Findings: No rash.   Neurological:     General: No focal deficit present.     Mental Status: She is  alert.   Psychiatric:        Mood and Affect: Mood normal.        Behavior: Behavior normal.    (all labs ordered are listed, but only abnormal results are displayed) Labs Reviewed - No data to display  EKG: None  Radiology: No results found.   Procedures   Medications Ordered in the ED  fluorescein  ophthalmic strip 1 strip (1 strip Both Eyes Given 02/01/24 1943)  tetracaine  (PONTOCAINE) 0.5 % ophthalmic solution 2 drop (2 drops Left Eye Given 02/01/24 1943)  erythromycin  ophthalmic ointment 1 Application (1 Application Left Eye Given 02/01/24 2105)                                    Medical Decision Making Risk Prescription drug management.   This patient presents to the ED for concern of eye pain, this involves an extensive number of treatment options, and is a complaint that carries with it a high risk of complications and morbidity.   Differential diagnosis includes: Corneal abrasion, ulceration, conjunctivitis, etc.    Comorbidities  See HPI above   Additional History  Additional history obtained from prior records  Consultations  I requested consultation with Dr. Alto Atta with ophthalmology,  and discussed lab and imaging findings as well as pertinent plan - they recommend: Erythromycin  ointment 4 times a day and follow-up in office 9 AM Monday morning.   Problem List / ED Course / Critical Interventions / Medication Management  Patient has pain to the left eye after scratching it at the bus stop.  Her eye has conjunctival injections and has watery discharge. No injury or trauma to the eye.  No worsening pain with eye movements.  I used tetracaine  to numb of the eye and then did a fluorescein  stain with Woods lamp, which showed corneal abrasion at the left iris/pupil. Patient is not use contact lenses. I have reviewed the patients home medicines and have made adjustments as needed   Social Determinants of Health  Tobacco use   Test / Admission -  Considered  Patient is stable and safe for discharge home. Return precautions given.    Final diagnoses:  Abrasion of left cornea, initial encounter    ED Discharge Orders          Ordered    erythromycin  ophthalmic ointment        02/01/24 2042               Sonnie Dusky, PA-C 02/01/24 2134    Arvilla Birmingham, MD 02/01/24 [phone]

## 2024-03-31 ENCOUNTER — Encounter (HOSPITAL_COMMUNITY): Payer: Self-pay | Admitting: *Deleted

## 2024-03-31 ENCOUNTER — Emergency Department (HOSPITAL_COMMUNITY)
Admission: EM | Admit: 2024-03-31 | Discharge: 2024-03-31 | Disposition: A | Discharge status: Home / Self Care | Attending: Emergency Medicine | Admitting: Emergency Medicine

## 2024-03-31 ENCOUNTER — Other Ambulatory Visit: Payer: Self-pay

## 2024-03-31 DIAGNOSIS — H578A1 Foreign body sensation, right eye: Secondary | ICD-10-CM | POA: Diagnosis not present

## 2024-03-31 DIAGNOSIS — H53141 Visual discomfort, right eye: Secondary | ICD-10-CM | POA: Diagnosis present

## 2024-03-31 DIAGNOSIS — E119 Type 2 diabetes mellitus without complications: Secondary | ICD-10-CM | POA: Insufficient documentation

## 2024-03-31 DIAGNOSIS — Z7984 Long term (current) use of oral hypoglycemic drugs: Secondary | ICD-10-CM | POA: Insufficient documentation

## 2024-03-31 MED ORDER — TETRACAINE HCL 0.5 % OP SOLN
2.0000 [drp] | Freq: Once | OPHTHALMIC | Status: AC
  Administered 2024-03-31 (×2): 2 [drp] via OPHTHALMIC
  Filled 2024-03-31: qty 4

## 2024-03-31 MED ORDER — ERYTHROMYCIN 5 MG/GM OP OINT
1.0000 | TOPICAL_OINTMENT | Freq: Once | OPHTHALMIC | Status: AC
  Administered 2024-03-31 (×2): 1 via OPHTHALMIC
  Filled 2024-03-31: qty 3.5

## 2024-03-31 MED ORDER — FLUORESCEIN SODIUM 1 MG OP STRP
1.0000 | ORAL_STRIP | Freq: Once | OPHTHALMIC | Status: AC
  Administered 2024-03-31 (×2): 1 via OPHTHALMIC
  Filled 2024-03-31: qty 1

## 2024-03-31 NOTE — ED Triage Notes (Signed)
 Patient present to ed  c/o pain in right eye onset last pm states her eye started itching and it has hurt her all night unable to sleep because of the pain.

## 2024-03-31 NOTE — Discharge Instructions (Addendum)
 You were seen in the ER today for concerns of a foreign body in the right eye. There was no signs of a foreign body or scratch to your eye. I would recommend following up with your ophthalmologist for further assessment of your eyes since you continue to have repeated feelings of a foreign body in your eye. You can use the erythromycin  you were given today up to four times daily for the next 5 days. For concerns of worsening symptoms, return to the ER.

## 2024-03-31 NOTE — ED Provider Notes (Signed)
 [place] EMERGENCY DEPARTMENT AT [facility] Provider Note   CSN: [id number] Arrival date & time: 03/31/24  9086     Patient presents with: Eye Problem   Gina Richards is a 63 y.o. female.  Patient past 3 significant for type 2 diabetes, substance use disorder here with concerns of eye discomfort.  Reports that she began to feel some right eye discomfort started around 7 PM last night.  Reports that these symptoms have progressively worsened since last night.  Endorses some vision alteration due to watering of her eye. No reported eye trauma or injury that she can recall.  No reported eye pain prior to yesterday.  Does not wear contact lenses.    Eye Problem Associated symptoms: redness        Prior to Admission medications   Medication Sig Start Date End Date Taking? Authorizing Provider  atorvastatin (LIPITOR) 20 MG tablet Take 20 mg by mouth at bedtime. Patient not taking: Reported on 07/02/2022 12/01/21   [provider]  benzonatate  (TESSALON ) 100 MG capsule Take 1 capsule (100 mg total) by mouth every 8 (eight) hours. 08/16/22   Enedelia Dorna HERO, FNP  cyclobenzaprine  (FLEXERIL ) 10 MG tablet Take 1 tablet (10 mg total) by mouth 3 (three) times daily as needed for muscle spasms. DO NOT DRIVE WHILE TAKING THIS MEDICATION 06/14/20   Blaise Aleene KIDD, MD  erythromycin  ophthalmic ointment Place a 1/2 inch ribbon of ointment into the lower eyelid four times a day for 5 days 02/01/24   Waddell Sluder, PA-C  gabapentin  (NEURONTIN ) 800 MG tablet Take 800 mg by mouth daily as needed (for pain). 12/01/21   [provider]  Guaifenesin  1200 MG TB12 Take 1 tablet (1,200 mg total) by mouth in the morning and at bedtime. 08/16/22   Enedelia Dorna HERO, FNP  losartan -hydrochlorothiazide  (HYZAAR) 50-12.5 MG tablet Take 1 tablet by mouth daily. 03/14/21   [provider]  metFORMIN  (GLUCOPHAGE ) 500 MG tablet Take 500 mg by mouth daily with breakfast.  04/07/21   [provider]  ondansetron  (ZOFRAN -ODT) 4 MG disintegrating tablet Take 1 tablet (4 mg total) by mouth every 8 (eight) hours as needed for nausea or vomiting. 08/16/22   Enedelia Dorna HERO, FNP  QUEtiapine  Fumarate (SEROQUEL  XR) 150 MG 24 hr tablet Take 150 mg by mouth at bedtime. 05/09/21   [provider]  escitalopram  (LEXAPRO ) 20 MG tablet Take 1 tablet (20 mg total) by mouth daily. 04/19/19 01/10/20  Rolinda Rogue, MD  promethazine  (PHENERGAN ) 25 MG tablet Take 1 tablet (25 mg total) by mouth every 8 (eight) hours as needed for nausea or vomiting. Patient not taking: Reported on 05/10/2015 11/10/14 06/15/15  Tracey Bruckner, PA-C  traZODone (DESYREL) 50 MG tablet Take 50 mg by mouth at bedtime. 03/13/17 04/19/19  [provider]    Allergies: Patient has no known allergies.    Review of Systems  Eyes:  Positive for pain and redness.  All other systems reviewed and are negative.   Updated Vital Signs BP (!) 174/109   Pulse 84   Temp 98 F (36.7 C)   Resp 18   Ht 5' 2 (1.575 m)   Wt 74.8 kg   SpO2 99%   BMI 30.18 kg/m   Physical Exam Vitals and nursing note reviewed.  Constitutional:      General: She is not in acute distress.    Appearance: She is well-developed.  HENT:     Head: Normocephalic and atraumatic.  Eyes:     General: No scleral icterus.       Right eye: Discharge present.        Left eye: No discharge.     Extraocular Movements: Extraocular movements intact.     Pupils: Pupils are equal, round, and reactive to light.     Comments: Conjunctival injection of the right eye with watery discharge. No trauma findings. Fluorescein  staining reveals no corneal abrasion or foreign body seen. Unclear cause of this sensation.  Cardiovascular:     Rate and Rhythm: Normal rate and regular rhythm.     Heart sounds: No murmur heard. Pulmonary:     Effort: Pulmonary effort is normal. No respiratory distress.     Breath sounds:  Normal breath sounds.  Abdominal:     Palpations: Abdomen is soft.     Tenderness: There is no abdominal tenderness.  Musculoskeletal:        General: No swelling.     Cervical back: Neck supple.  Skin:    General: Skin is warm and dry.     Capillary Refill: Capillary refill takes less than 2 seconds.  Neurological:     Mental Status: She is alert.  Psychiatric:        Mood and Affect: Mood normal.     (all labs ordered are listed, but only abnormal results are displayed) Labs Reviewed - No data to display  EKG: None  Radiology: No results found.   Procedures   Medications Ordered in the ED  tetracaine  (PONTOCAINE) 0.5 % ophthalmic solution 2 drop (2 drops Right Eye Given 03/31/24 1027)  fluorescein  ophthalmic strip 1 strip (1 strip Right Eye Given 03/31/24 1027)  erythromycin  ophthalmic ointment 1 Application (1 Application Right Eye Given 03/31/24 1027)                                    Medical Decision Making Risk Prescription drug management.   This patient presents to the ED for concern of eye discomfort.  Differential diagnosis includes foreign body, conjunctivitis, corneal abrasion, corneal lesion   Medicines ordered and prescription drug management:  I ordered medication including tetracaine , fluorescein  ophthalmic strip, erythromycin  ointment for topical anesthetic, eye examination, foreign body sensation Reevaluation of the patient after these medicines showed that the patient improved I have reviewed the patients home medicines and have made adjustments as needed   Problem List / ED Course:  Patient with past history significant for substance use disorder, diabetes here with concerns of eye irritation.  Reports that she had right-sided eye discomfort develop around 7 PM last night.  Denies any obvious foreign body falling to the eye or any trauma to the eye.  Does endorse some vision change due to the sensation of something in her eye.  No reported  bleeding or other injury. On exam, patient has clear tearing of the right eye.  Left eye unremarkable.  Fluorescein  staining reveals no obvious area of increased uptake to suggest corneal abrasion or corneal lesion.  No appreciable foreign body seen.  With patient's history of recurrent symptoms similar to this, I do feel like she would benefit from ophthalmology evaluation.   Will start patient on erythromycin  ointment to help with the foreign body sensation.  Advise return precautions such as concerns for new or worsening symptoms.  Otherwise stable for outpatient follow-up discharged home.   Social Determinants of Health:  None  Final diagnoses:  Sensation of foreign body in right eye    ED Discharge Orders     None          Cecily Legrand LABOR, PA-C 03/31/24 1047    Pamella Ozell LABOR, DO 04/07/24 1116

## 2024-08-18 ENCOUNTER — Other Ambulatory Visit: Payer: Self-pay

## 2024-08-18 ENCOUNTER — Emergency Department (HOSPITAL_COMMUNITY)
Admission: EM | Admit: 2024-08-18 | Discharge: 2024-08-18 | Disposition: A | Discharge status: Home / Self Care | Attending: Emergency Medicine | Admitting: Emergency Medicine

## 2024-08-18 ENCOUNTER — Encounter (HOSPITAL_COMMUNITY): Payer: Self-pay

## 2024-08-18 ENCOUNTER — Emergency Department (HOSPITAL_COMMUNITY)

## 2024-08-18 DIAGNOSIS — M7731 Calcaneal spur, right foot: Secondary | ICD-10-CM | POA: Insufficient documentation

## 2024-08-18 DIAGNOSIS — I10 Essential (primary) hypertension: Secondary | ICD-10-CM | POA: Insufficient documentation

## 2024-08-18 DIAGNOSIS — G8929 Other chronic pain: Secondary | ICD-10-CM | POA: Diagnosis not present

## 2024-08-18 DIAGNOSIS — M25562 Pain in left knee: Secondary | ICD-10-CM | POA: Diagnosis not present

## 2024-08-18 DIAGNOSIS — F17210 Nicotine dependence, cigarettes, uncomplicated: Secondary | ICD-10-CM | POA: Insufficient documentation

## 2024-08-18 DIAGNOSIS — M25552 Pain in left hip: Secondary | ICD-10-CM | POA: Insufficient documentation

## 2024-08-18 DIAGNOSIS — E119 Type 2 diabetes mellitus without complications: Secondary | ICD-10-CM | POA: Diagnosis not present

## 2024-08-18 LAB — BASIC METABOLIC PANEL WITH GFR
Anion gap: 14 (ref 5–15)
BUN: 21 mg/dL (ref 8–23)
CO2: 24 mmol/L (ref 22–32)
Calcium: 9.4 mg/dL (ref 8.9–10.3)
Chloride: 98 mmol/L (ref 98–111)
Creatinine, Ser: 0.74 mg/dL (ref 0.44–1.00)
GFR, Estimated: >60 mL/min
Glucose, Bld: 135 mg/dL — ABNORMAL HIGH (ref 70–99)
Potassium: 3.6 mmol/L (ref 3.5–5.1)
Sodium: 136 mmol/L (ref 135–145)

## 2024-08-18 LAB — CBC WITH DIFFERENTIAL/PLATELET
Abs Immature Granulocytes: 0.01 K/uL (ref 0.00–0.07)
Basophils Absolute: 0 K/uL (ref 0.0–0.1)
Basophils Relative: 1 %
Eosinophils Absolute: 0.1 K/uL (ref 0.0–0.5)
Eosinophils Relative: 1 %
HCT: 43.3 % (ref 36.0–46.0)
Hemoglobin: 14.8 g/dL (ref 12.0–15.0)
Immature Granulocytes: 0 %
Lymphocytes Relative: 49 %
Lymphs Abs: 2.5 K/uL (ref 0.7–4.0)
MCH: 32 pg (ref 26.0–34.0)
MCHC: 34.2 g/dL (ref 30.0–36.0)
MCV: 93.5 fL (ref 80.0–100.0)
Monocytes Absolute: 0.8 K/uL (ref 0.1–1.0)
Monocytes Relative: 17 %
Neutro Abs: 1.6 K/uL — ABNORMAL LOW (ref 1.7–7.7)
Neutrophils Relative %: 32 %
Platelets: 291 K/uL (ref 150–400)
RBC: 4.63 MIL/uL (ref 3.87–5.11)
RDW: 13.7 % (ref 11.5–15.5)
WBC: 5 K/uL (ref 4.0–10.5)
nRBC: 0 % (ref 0.0–0.2)

## 2024-08-18 MED ORDER — MELOXICAM 15 MG PO TABS: 15.0000 mg | ORAL_TABLET | Freq: Every day | ORAL | 2 refills | Status: AC

## 2024-08-18 MED ORDER — OXYCODONE-ACETAMINOPHEN 5-325 MG PO TABS
1.0000 | ORAL_TABLET | Freq: Once | ORAL | Status: AC
  Administered 2024-08-18: 1 via ORAL
  Filled 2024-08-18: qty 1

## 2024-08-18 MED ORDER — KETOROLAC TROMETHAMINE 15 MG/ML IJ SOLN
15.0000 mg | Freq: Once | INTRAMUSCULAR | Status: AC
  Administered 2024-08-18: 15 mg via INTRAMUSCULAR
  Filled 2024-08-18: qty 1

## 2024-08-18 MED ORDER — MELOXICAM 15 MG PO TABS: 15.0000 mg | ORAL_TABLET | Freq: Every day | ORAL | 2 refills | Status: DC

## 2024-08-18 NOTE — ED Provider Notes (Signed)
 " [facility] Provider Note   HPI/ROS    History obtained from patient.  Gina Richards is a 63 y.o. female who presents for No chief complaint on file. and who  has a past medical history of Anxiety, Bipolar 1 disorder (HCC), Depression, Diabetes mellitus without complication (HCC), Hypertension, Pneumonia, and Stroke (HCC).  Patient presents today with left hip pain, left knee pain, and right ankle pain.  Does also endorse some intermittent numbness in the right hand, but states is not currently numb.  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  Has been battling this hip/thigh pain for almost 2 years since she had surgery.  States that this knot on the back of her right ankle came up sometime ago, and now is causing her some discomfort.  States she is able to ambulate but it is just painful for her.  Denies any numbness or tingling in her legs.  Denies any bowel or bladder incontinence.  Denies any fevers, chills, chest pain, shortness breath, nausea, vomiting, diarrhea.  States she came in today because the pain became unbearable.  MDM   I have reviewed the nursing documentation, vital signs, as well as the past medical history, surgical history, family history, and social history.  Initial Assessment:  Patient hemodynamically stable on initial evaluation.  Nonfocal neurologic exam with no signs of septic arthritis in the hip, knee, ankle, or hands.  Afebrile here and labs here with no significant leukocytosis, anemia, or thrombocytopenia.  No significant electrolyte abnormality or AKI.  No new trauma or falls, so lower concern for fracture/dislocation.  Possibly osteoarthritis induced pain.  No history of RA.  Less likely be gout given polyarticular.  Will obtain plain films of the hip, femur, knee, and ankle.  X-rays here demonstrating severe arthritis, which would  be consistent with patient's presentation today and pain.  Also has calcaneal spur which is likely a swelling on the posterior aspect of her ankle that she was worried about.  Given no signs of joint effusion, septic arthritis, gout, or other concerning findings here patient stable for discharge.  Discussed with her in detail that she can use Mobic at home for pain and her and her daughter about plan to make appointments with orthopedics tomorrow for follow-up.  They were given the number in the discharge instructions.    Discussed that we cannot currently steroids given she is diabetic and would likely cause poorly controlled glucose, she is okay with this and would not like to be on steroids if it is going to do that to her.  Patient discharged home in hemodynamic stable condition with strict return precautions.  She was follow-up with orthopedics and her PCP in the outpatient setting.   Disposition:  I discussed the plan for discharge with the patient and/or their surrogate at bedside prior to discharge and they were in agreement with the plan and verbalized understanding of the return precautions provided. All questions answered to the best of my ability. Ultimately, the patient was discharged in stable condition with stable vital signs. I am reassured that they are capable of close follow up and good social support at home.   This patient was staffed with Dr. Dean who supervised the visit and agreed with the plan of care.   Due to the patients current presenting symptoms, physical exam findings, and  the workup stated above, it is thought that the etiology of the patients current presentation is:  1. Left hip pain   2. Chronic pain of left knee   3. Calcaneal spur of right foot      Clinical Complexity A medically appropriate history, review of systems, and physical exam was performed.  Factors that affect the complexity of this encounter: assessment of correct protocol and laboratory  work from this visit  My independent interpretations of diagnostic studies are documented in the ED course above.   If decision rules were used in this patient's evaluation, they are listed below.   Click here for ABCD2, HEART and other calculators  Patient's presentation is most consistent with exacerbation of chronic illness.  MDM generated using voice dictation software and may contain dictation errors. Please contact me for any clarification or with any questions.    Physical Exam, PMH, PSH, Family History, and Social Hsitory   Vitals:   08/18/24 1231 08/18/24 1825  BP: (!) 134/103 (!) 109/95  Pulse: 90 95  Resp: 17 17  Temp: 98.6 F (37 C)   SpO2: 92% 98%    Physical Exam Constitutional:      Appearance: Normal appearance.  HENT:     Head: Normocephalic and atraumatic.  Cardiovascular:     Rate and Rhythm: Normal rate and regular rhythm.  Pulmonary:     Effort: Pulmonary effort is normal.     Breath sounds: No wheezing or rales.  Abdominal:     General: Abdomen is flat.     Palpations: Abdomen is soft.  Musculoskeletal:        General: No deformity.     Comments: Pain to palpation over the left knee.  Some pain with range of motion of the left knee, but not out of proportion to what is expected.  Full range of motion of the knee still even though pain with ranging.  Full range of motion of the hip with some pain.  Palpable likely calcaneal spur on posterior aspect of right heel.  2+ DP pulses bilaterally.  Intact sensation throughout both legs.  Skin:    General: Skin is warm and dry.     Capillary Refill: Capillary refill takes less than 2 seconds.  Neurological:     General: No focal deficit present.     Mental Status: She is alert and oriented to person, place, and time.     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      Procedures   If procedures were preformed on this patient, they are listed below:  Procedures   Electronically signed by:   Glendia Carlin Ancona, M.D. PGY-2, Emergency  Medicine   Please note that this documentation was produced with the assistance of voice-to-text technology and may contain errors.    Guillermina Hamilton, MD 08/18/24 1925  "

## 2024-08-18 NOTE — Discharge Instructions (Addendum)
 You were seen today for knee and hip pain as well as ankle swelling. While you were here we monitored your vitals, preformed a physical exam, and obtained x-rays. These were all reassuring and there is no indication for any further testing or intervention in the emergency department at this time.   Things to do:  - Follow up with your primary care provider within the next 1-2 weeks - Please call orthopedics at [phone] to schedule an appointment for your joint pain - We also wrote you a prescription for meloxicam for your pain.  You can likely find a GoodRx coupon to make this cheaper if your insurance does not cover it  Return to the emergency department if you have any new or worsening symptoms including inability to walk, numbness or tingling the extremities, worsening pain at home, or if you have any other concerns.

## 2024-08-18 NOTE — ED Triage Notes (Signed)
 Patient states she had a left hip operation 2 years ago and ever since she has been having pain in the left knee and up into her thigh. She reports she can't stand the pain anymore. She also has a knot on her right leg.

## 2024-08-18 NOTE — ED Provider Triage Note (Signed)
 Emergency Medicine Provider Triage Evaluation Note  Gina Richards , a 63 y.o. female  was evaluated in triage.  Pt complains of multiple complaints. Report pain to L hip and L knee for months. Pain is getting progressively worse.  Endorse R hand going numb periodically.  Also endorse having a knot to the back of her R heel for several days that is painful.  No fever, chills, n/v/d  Review of Systems  Positive: As above Negative: As above  Physical Exam  BP (!) 134/103 (BP Location: Right Arm)   Pulse 90   Temp 98.6 F (37 C)   Resp 17   SpO2 92%  Gen:   Awake, no distress   Resp:  Normal effort  MSK:   Moves extremities without difficulty  Other:    Medical Decision Making  Medically screening exam initiated at 2:35 PM.  Appropriate orders placed.  Gina Richards was informed that the remainder of the evaluation will be completed by another provider, this initial triage assessment does not replace that evaluation, and the importance of remaining in the ED until their evaluation is complete.     Nivia Colon, PA-C 08/18/24 [phone]
